# Patient Record
Sex: Male | Born: 1950
Health system: Southern US, Community
[De-identification: ages and names within clinical notes are randomized; demographics above are authoritative.]

## PROBLEM LIST (undated history)

## (undated) DIAGNOSIS — G2581 Restless legs syndrome: Secondary | ICD-10-CM

## (undated) DIAGNOSIS — G1221 Amyotrophic lateral sclerosis: Secondary | ICD-10-CM

## (undated) DIAGNOSIS — R413 Other amnesia: Secondary | ICD-10-CM

## (undated) DIAGNOSIS — G3184 Mild cognitive impairment, so stated: Secondary | ICD-10-CM

## (undated) DIAGNOSIS — N429 Disorder of prostate, unspecified: Secondary | ICD-10-CM

## (undated) HISTORY — DX: Disorder of prostate, unspecified: N42.9

## (undated) HISTORY — DX: Other amnesia: R41.3

## (undated) HISTORY — DX: Restless legs syndrome: G25.81

## (undated) HISTORY — PX: OTHER SURGICAL HISTORY: SHX169

## (undated) HISTORY — PX: NO PAST SURGERIES: SHX2092

## (undated) HISTORY — DX: Mild cognitive impairment of uncertain or unknown etiology: G31.84

---

## 1999-04-12 ENCOUNTER — Encounter: Payer: Self-pay | Admitting: Family Medicine

## 1999-04-12 ENCOUNTER — Encounter: Admission: RE | Admit: 1999-04-12 | Discharge: 1999-04-12 | Payer: Self-pay | Admitting: Family Medicine

## 2004-08-19 ENCOUNTER — Ambulatory Visit (HOSPITAL_COMMUNITY): Admission: RE | Admit: 2004-08-19 | Discharge: 2004-08-19 | Payer: Self-pay | Admitting: *Deleted

## 2009-12-24 ENCOUNTER — Encounter: Admission: RE | Admit: 2009-12-24 | Discharge: 2009-12-24 | Payer: Self-pay | Admitting: Family Medicine

## 2013-08-14 ENCOUNTER — Encounter: Payer: Self-pay | Admitting: Cardiology

## 2014-11-13 ENCOUNTER — Ambulatory Visit (INDEPENDENT_AMBULATORY_CARE_PROVIDER_SITE_OTHER): Payer: Medicare Other | Admitting: Neurology

## 2014-11-13 ENCOUNTER — Encounter: Payer: Self-pay | Admitting: Neurology

## 2014-11-13 VITALS — BP 124/71 | HR 42 | Ht 68.0 in | Wt 151.4 lb

## 2014-11-13 DIAGNOSIS — R413 Other amnesia: Secondary | ICD-10-CM

## 2014-11-13 DIAGNOSIS — R7302 Impaired glucose tolerance (oral): Secondary | ICD-10-CM | POA: Diagnosis not present

## 2014-11-13 DIAGNOSIS — R251 Tremor, unspecified: Secondary | ICD-10-CM | POA: Diagnosis not present

## 2014-11-13 NOTE — Patient Instructions (Addendum)
Overall you are doing fairly well but I do want to suggest a few things today:   Remember to drink plenty of fluid, eat healthy meals and do not skip any meals. Try to eat protein with a every meal and eat a healthy snack such as fruit or nuts in between meals. Try to keep a regular sleep-wake schedule and try to exercise daily, particularly in the form of walking, 20-30 minutes a day, if you can.   As far as your medications are concerned, I would like to suggest: Consider [name]  As far as diagnostic testing: MRi of the brain, labs, neurocognitive testing  I would like to see you back in 6 months, sooner if we need to. Please call us with any interim questions, concerns, problems, updates or refill requests.   Our phone number is [phone]. We also have an after hours call service for urgent matters and there is a physician on-call for urgent questions. For any emergencies you know to call 911 or go to the nearest emergency room

## 2014-11-13 NOTE — Progress Notes (Signed)
[name]    Provider:  Dr Lucia Gaskins Referring Provider: Catha Gosselin, MD Primary Care Physician:  [name], MD  CC:  Memory issues  HPI:  Guy Payne is a 64 y.o. male here as a referral from Dr. Clarene Duke for memory issues.PMHx of glucose intolerance, migraines.  He is here with wife who provides information, It is becoming more difficult to remember people's names, directions, conversations. Wife provides information. He says things that are not necessarily true for example as he will not say the right city they went to, word retrieval difficulties. Symptoms started within the last year or two and getting worse. No changes in personality, no behavioral changes, some jokes are more inappropriate than others but this is not necessary anything new. No delusions or hallucinations. He says sometimes when people say things he zones out if it isn't interesting him and maybe that is why he appears to forget things and repeat things they just spoke about. No history of demenita in the family. Denies any episodes of altered mental status. He makes wrong turns a lot. He needs GPS for everything even if he went to it before. Long-term memory is fine. Wife has always managed the finances. 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 No accidents in the home. He enjoys a few glasses of wine 3-4 times a week. Wife states he has a chin tremor but patient states this is voluntary. No other focal neurologic complaints.   Reviewed notes, labs and imaging from outside physicians, which showed: Reviewed primary care notes. Vitamin B12 and thyroid was checked per primary care notes. Primary care recommended continuing regular exercise which can help memory as well as keeping in mind active with reading, crossword puzzles etc.    Review of Systems: Patient complains of symptoms per HPI as well as the following symptoms; No CP, no SOB, No fever or  systemic symptoms. Pertinent negatives per HPI. All others negative.   Social History   Social History  . Marital Status: Single    Spouse Name: N/A  . Number of Children: 1  . Years of Education: 16+   Occupational History  . Retired     Social History Main Topics  . Smoking status: Never Smoker   . Smokeless tobacco: Not on file  . Alcohol Use: 0.0 oz/week    0 Standard drinks or equivalent per week     Comment: 3-4 days per week. No more than 2 glasses  . Drug Use: No  . Sexual Activity: Not on file   Other Topics Concern  . Not on file   Social History Narrative   Lives at home with wife   Caffeine use: Drinks 2 cups coffee in morning   Drinks tea- 2 glasses occasionally     History reviewed. No pertinent family history.  History reviewed. No pertinent past medical history.  Past Surgical History  Procedure Laterality Date  . No past surgeries      Current Outpatient Prescriptions  Medication Sig Dispense Refill  . tadalafil (CIALIS) 10 MG tablet Take 10 mg by mouth daily as needed for erectile dysfunction.     No current facility-administered medications for this visit.    Allergies as of 11/13/2014  . (No Known Allergies)    Vitals: BP 124/71 mmHg  Pulse 42  Ht  (1.727 m)  Wt 151 lb 6.4 oz (68.675 kg)  BMI 23.03 kg/m2 Last Weight:  Wt Readings from Last 1 Encounters:  11/13/14 151 lb 6.4 oz (68.675 kg)   Last Height:   Ht Readings from Last 1 Encounters:  11/13/14  (1.727 m)   Physical exam: Exam: Gen: NAD, conversant, well nourised, well groomed                     CV: RRR, no MRG. No Carotid Bruits. No peripheral edema, warm, nontender Eyes: Conjunctivae clear without exudates or hemorrhage  Neuro: Detailed Neurologic Exam  Speech:    Speech is normal; fluent and spontaneous with normal comprehension.  Cognition:  Montreal Cognitive Assessment  11/13/2014  Visuospatial/ Executive (0/5) 5  Naming (0/3) 1  Attention:  Read list of digits (0/2) 1  Attention: Read list of letters (0/1) 1  Attention: Serial 7 subtraction starting at 100 (0/3) 3  Language: Repeat phrase (0/2) 2  Language : Fluency (0/1) 1  Abstraction (0/2) 1  Delayed Recall (0/5) 1  Orientation (0/6) 5  Total 21  Adjusted Score (based on education) 21       The patient is oriented to person, place, and time;     recent and remote memory intact;     language fluent;     normal attention, concentration,     fund of knowledge Cranial Nerves:    The pupils are equal, round, and reactive to light. The fundi are flat. Visual fields are full to finger confrontation. Extraocular movements are intact. Trigeminal sensation is intact and the muscles of mastication are normal. The face is symmetric. The palate elevates in the midline. Hearing intact. Voice is normal. Shoulder shrug is normal. The tongue has normal motion without fasciculations.   Coordination:    Normal finger to nose and heel to shin. Normal rapid alternating movements.   Gait:    Heel-toe and tandem gait are normal.   Motor Observation:    No asymmetry, no atrophy, and no involuntary movements noted. Tone:    Normal muscle tone.    Posture:    Posture is normal. normal erect    Strength:    Strength is V/V in the upper and lower limbs.      Sensation: intact to LT     Reflex Exam:  DTR's: Absent AJs    Deep tendon reflexes in the upper and lower extremities are brisk bilaterally.   Toes:    The toes are downgoing bilaterally.   Clonus:    Clonus is absent.       Assessment/Plan:  This is a very lovely 64 year old male who is here for several years of progressive memory loss with his wife. MoCA 21/30 does shows some cognitive impairment more advanced than for age. He is very pleasant and does not appear to have any behavioral disturbances. Recommend MRI of the brain. Also discussed starting [name]. Did have a discussion about neurocognitive testing and they  would like to go forward with this. Patient is to follow-up with me when evaluation of the above is complete. Limit alcohol to maximum 1-2 drinks a day per guidelines but I suggest less daily. At next appointment will recheck [facility] cognitive assessment test, discuss starting [name], and review formal neurocognitive testing.   Naomie Dean, MD  [facility] [address] [place], [place] [address]  Phone [phone] Fax [phone]

## 2014-11-16 DIAGNOSIS — R413 Other amnesia: Secondary | ICD-10-CM

## 2014-11-16 DIAGNOSIS — G3184 Mild cognitive impairment, so stated: Secondary | ICD-10-CM | POA: Insufficient documentation

## 2014-11-16 DIAGNOSIS — R7302 Impaired glucose tolerance (oral): Secondary | ICD-10-CM | POA: Insufficient documentation

## 2014-12-11 ENCOUNTER — Ambulatory Visit
Admission: RE | Admit: 2014-12-11 | Discharge: 2014-12-11 | Disposition: A | Discharge status: Home / Self Care | Payer: Medicare Other | Source: Ambulatory Visit | Attending: Neurology | Admitting: Neurology

## 2014-12-11 DIAGNOSIS — R413 Other amnesia: Secondary | ICD-10-CM | POA: Diagnosis not present

## 2014-12-11 DIAGNOSIS — R251 Tremor, unspecified: Secondary | ICD-10-CM | POA: Diagnosis not present

## 2014-12-15 ENCOUNTER — Telehealth: Payer: Self-pay | Admitting: Neurology

## 2014-12-15 NOTE — Telephone Encounter (Signed)
Called again, no answer. Left message saying I was calling about MRi results. They can call back.

## 2014-12-15 NOTE — Telephone Encounter (Signed)
Called to speak to patient and wife about MRI results. Left messages on home and call number. 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    MRi of the brain: This MRI of the brain without contrast shows the following: 1. 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 Mild to moderate atrophy elsewhere in the brain. 2. Minimal extent of her foci in the deep white matter consistent with minimal age-related microvascular ischemic changes. 3. There are no acute findings.

## 2014-12-16 NOTE — Telephone Encounter (Signed)
Pt's wife called and is asking for a return call on her cell phone [phone], Lurena JoinerRebecca . Thank you

## 2014-12-16 NOTE — Telephone Encounter (Signed)
Patient and wife returned phone call. Please call back to Hm [phone] or Wife's cell [phone].

## 2014-12-17 NOTE — Telephone Encounter (Signed)
I spoke to wife. Apologized for the phone tag, I have called multiple times and left multiple messages and also spoke to husband earlier today. 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

## 2014-12-17 NOTE — Telephone Encounter (Signed)
Spoke to patient, discussed MRi of the brain and the severe mesial temporal atrophy. His clinical exam though did not show severe memory loss that would correlate with severe atrophy of this extent so there is some inconsistently between the MRI and the clincial exam. Still, this is associated with an alzheimer's type dementia.    Kara Meadmma - can you put a copy of his MRi of the brain in the mail to patient today? He gave me verbal approval so I am happy to sign a medical release form to get that to him. Thanks!

## 2014-12-17 NOTE — Telephone Encounter (Signed)
Pt's wife called a little upset, sts she wants to talk with Dr Lucia GaskinsAhern, she has been the one calling for results and wants to know what she can do to help. Please call and advise her at cell [phone].

## 2014-12-17 NOTE — Telephone Encounter (Signed)
Pt's wife called ,Lurena JoinerRebecca, and would like a call back pertaining pt's MRI results. Thank you. Please call [phone]

## 2014-12-18 ENCOUNTER — Other Ambulatory Visit: Payer: Self-pay | Admitting: Neurology

## 2014-12-18 MED ORDER — DONEPEZIL HCL 10 MG PO TABS: 10.0000 mg | ORAL_TABLET | Freq: Every day | ORAL | Status: DC

## 2014-12-18 MED ORDER — DONEPEZIL HCL 5 MG PO TABS: 5.0000 mg | ORAL_TABLET | Freq: Every day | ORAL | Status: DC

## 2014-12-18 NOTE — Telephone Encounter (Signed)
Placed Rx [name] 10mg  up front for pt/wife to pick up. Also placed record release form with MRI results for them to pick up at the front.

## 2014-12-18 NOTE — Telephone Encounter (Signed)
Pt's wife called and states that while speaking with Dr. Lucia GaskinsAhern about MRI results she was told that a Rx for [name] was going to be called into their pharmacy. She is at the pharmacy now and they do not have it. She is requesting a call back on cell phone [phone]. Thank you

## 2014-12-18 NOTE — Telephone Encounter (Signed)
Called wife back and relayed Dr Lucia GaskinsAhern message below. She is going to stop by tomorrow to pick up Rx aricept 10mg  and sign release form to get MRI results. Explained how to sign up for mychart, but she declined. Offered to fax record release form, but she said she does not have a fax number. Advised we are open 8-12pm tomorrow. She verbalized understanding.

## 2015-01-06 ENCOUNTER — Other Ambulatory Visit: Payer: Self-pay

## 2015-01-06 MED ORDER — DONEPEZIL HCL 10 MG PO TABS: 10.0000 mg | ORAL_TABLET | Freq: Every day | ORAL | Status: DC

## 2015-05-14 ENCOUNTER — Ambulatory Visit (INDEPENDENT_AMBULATORY_CARE_PROVIDER_SITE_OTHER): Payer: Medicare Other | Admitting: Nurse Practitioner

## 2015-05-14 ENCOUNTER — Encounter: Payer: Self-pay | Admitting: Nurse Practitioner

## 2015-05-14 VITALS — BP 112/72 | HR 46 | Ht 68.0 in | Wt 148.0 lb

## 2015-05-14 DIAGNOSIS — R413 Other amnesia: Secondary | ICD-10-CM

## 2015-05-14 DIAGNOSIS — G4761 Periodic limb movement disorder: Secondary | ICD-10-CM | POA: Insufficient documentation

## 2015-05-14 NOTE — Progress Notes (Addendum)
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 male here as a referral from [name] for memory issues.PMHx of glucose intolerance, migraines. He is here with wife who provides information, It is becoming more difficult to remember people's names, directions, conversations. Wife provides information. He says things that are not necessarily true for example as he will not say the right city they went to, word retrieval difficulties. Symptoms started within the last year or two and getting worse. No changes in personality, no behavioral changes, some jokes are more inappropriate than others but this is not necessary anything new. No delusions or hallucinations. He says sometimes when people say things he zones out if it isn't interesting him and maybe that is why he appears to forget things and repeat things they just spoke about. No history of demenita in the family. Denies any episodes of altered mental status. He makes wrong turns a lot. He needs GPS for everything even if he went to it before. Long-term memory is fine. Wife has always managed the finances. 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 No accidents in the home. He enjoys a few glasses of wine 3-4 times a week. Wife states he has a chin tremor but patient states this is voluntary. No other focal neurologic complaints. 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 Mr.Guy Payne, 65 year old male returns for follow-up with history of memory loss with symptoms starting about 2 years ago and worsening. He has more difficulty remembering people's names directions in conversations. He needs GPS for driving. He is retired. 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  Wife also reports today that he has movements of his extremities when he is asleep at night. He is kicking her and she can't stay asleep.  She is not very specific as to how often this occurs she thinks maybe once or twice a week. He also has abnormal movements which sound like myoclonus.  He returns for reevaluation    REVIEW OF SYSTEMS: Full 14 system review of systems performed and notable only for those listed, all others are neg:  Constitutional: neg  Cardiovascular: neg Ear/Nose/Throat: neg  Skin: neg Eyes: neg Respiratory: neg Gastroitestinal: neg  Hematology/Lymphatic: neg  Endocrine: neg Musculoskeletal:neg Allergy/Immunology: neg Neurological: neg Psychiatric: neg Sleep : neg   ALLERGIES: No Known Allergies  HOME MEDICATIONS: Outpatient Prescriptions Prior to Visit  Medication Sig Dispense Refill  . donepezil ([name]) 5 MG tablet Take 1 tablet (5 mg total) by mouth at bedtime. 30 tablet 0  . tadalafil (CIALIS) 10 MG tablet Take 10 mg by mouth daily as needed for erectile dysfunction.    Marland Kitchen. donepezil ([name]) 10 MG tablet Take 1 tablet (10 mg total) by mouth at bedtime. 90 tablet 3   No facility-administered medications prior to visit.    PAST MEDICAL HISTORY: History reviewed. No pertinent past medical history.  PAST SURGICAL HISTORY: Past Surgical History  Procedure Laterality Date  . No past surgeries      FAMILY HISTORY: History reviewed. No pertinent family history.  SOCIAL HISTORY: Social History   Social History  . Marital Status: Single    Spouse Name: N/A  . Number of Children: 1  . Years of Education: 16+   Occupational History  . Retired     Social History Main Topics  . Smoking status: Never Smoker   . Smokeless tobacco: Never Used  . Alcohol Use: 0.0 oz/week    0  Standard drinks or equivalent per week     Comment: 3-4 days per week. No more than 2 glasses  . Drug Use: No  . Sexual Activity: Not on file   Other Topics Concern  . Not on file   Social History Narrative   Lives at home with wife   Caffeine use: Drinks 2 cups coffee in morning   Drinks tea- 2 glasses occasionally      PHYSICAL EXAM  Filed Vitals:   05/14/15 1322  BP: 112/72  Pulse: 46  Height:  (1.727 m)  Weight: 148 lb (67.132 kg)   Body mass index is 22.51 kg/(m^2).  Generalized: Well developed, in no acute distress  Head: normocephalic and atraumatic,. Oropharynx benign  Neck: Supple, no carotid bruits  Cardiac: Regular rate rhythm, no murmur  Musculoskeletal: No deformity   Neurological examination   Mentation: Alert MMSE 23/30 missing items in orientation 3 of 3 recall .   Follows all commands speech and language fluent.   Cranial nerve II-XII: Pupils were equal round reactive to light extraocular movements were full, visual field were full on confrontational test. Facial sensation and strength were normal. hearing was intact to finger rubbing bilaterally. Uvula tongue midline. head turning and shoulder shrug were normal and symmetric.Tongue protrusion into cheek strength was normal. Motor: normal bulk and tone, full strength in the BUE, BLE, fine finger movements normal, no pronator drift. No focal weakness Sensory: normal and symmetric to light touch, pinprick, and  Vibration, proprioception  Coordination: finger-nose-finger, heel-to-shin bilaterally, no dysmetria Reflexes: Brachioradialis 2/2, biceps 2/2, triceps 2/2, patellar 2/2, Achilles 2/2, plantar responses were flexor bilaterally. Gait and Station: Rising up from seated position without assistance, normal stance,  moderate stride, good arm swing, smooth turning, able to perform tiptoe, and heel walking without difficulty. Tandem gait is steady  DIAGNOSTIC DATA (LABS, IMAGING, TESTING) - ASSESSMENT  AND PLAN  65 y.o. year old male  with memory loss which has been going on for several years. 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 Abnormal movements in sleep? 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  I have personally evaluated lab data, reviewed imaging studies and agree with radiology interpretations.    Naomie Dean, MD [name]

## 2015-05-14 NOTE — Patient Instructions (Signed)
Continue [name] Will order sleep referral F/U in 6 months

## 2015-06-18 ENCOUNTER — Encounter: Payer: Self-pay | Admitting: Neurology

## 2015-06-18 ENCOUNTER — Ambulatory Visit (INDEPENDENT_AMBULATORY_CARE_PROVIDER_SITE_OTHER): Payer: Medicare Other | Admitting: Neurology

## 2015-06-18 VITALS — BP 124/84 | HR 58 | Resp 20 | Ht 68.0 in | Wt 145.0 lb

## 2015-06-18 DIAGNOSIS — G4769 Other sleep related movement disorders: Secondary | ICD-10-CM

## 2015-06-18 DIAGNOSIS — G300 Alzheimer's disease with early onset: Secondary | ICD-10-CM | POA: Diagnosis not present

## 2015-06-18 DIAGNOSIS — G478 Other sleep disorders: Secondary | ICD-10-CM

## 2015-06-18 DIAGNOSIS — F0280 Dementia in other diseases classified elsewhere without behavioral disturbance: Secondary | ICD-10-CM

## 2015-06-18 NOTE — Progress Notes (Signed)
SLEEP MEDICINE CLINIC   Provider:  Melvyn Novas, M D  Referring Provider: Catha Gosselin, MD Primary Care Physician:  [name], MD  Chief Complaint  Patient presents with  . New Patient (Initial Visit)    arms and legs move around in sleep, rm 10, with wife    HPI:  Guy Payne is a 65 y.o. male , seen here as an internal [facility]  referral from Dr Lucia Gaskins and Darrol Angel, NP  Chief complaint according to patient : "Dreams acting out " , more impulsive, easily agitated. 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     Mr. and Mrs. Guy Payne is seeing me today for concern of the patient's wife having noticed him to have sudden movements during his sleep. She describes these as almost jerking sudden, myoclonic characterized movements that the patient himself is not aware of as he continues to sleep. His wife however wakes up next to him from these movements. He reports that she snores, but he does not feel that his sleep is of lower quality, not restorative or not refreshing. I was able to review Carolyn's and Antonia's notes, and reviewed that the patient's long-term memory has been assessed multiple times is quite intact, but short-term memory loss has been evident. His wife had noticed that he has a chin tremor , that he is often incessantly moving. 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 The MRI was dated 12-12-14.    Sleep habits are as follows: The patient goes to bed between 9 and 9:30 PM and usually rises at about 6:30 AM. He reports that multiple times at night he will wake up but he does not have trouble going back to sleep. He has frequent bathroom breaks more than 2- 3 at night. He never used to take naps until the last year. His wife confirms that the myoclonic jerking happens between 2 and 4 AM most nights. It can be up to 6 or 7 of these movements in a cluster. This has also raised her concerns about possible seizures. He  sleeps overall 8 hours each night.  He snores mildly and not every night. The bedroom is cool, quiet and dark. He sleeps on one pillow, prefers to fall asleep on his side. He " spoons with his wife " . Some nights a grand child will sleep in their bed.    Sleep medical history and family sleep history:  No history of sleep disorders.    Social history:  Married, father and grandfather.    Review of Systems: Out of a complete 14 system review, the patient complains of only the following symptoms, and all other reviewed systems are negative. He moves with both hands and both legs, RLS ? Patient feels her restless leg rating scale at minor impairment in the quality of life questionnaire for restless leg patients at sometimes impairment. Fatigue severity scale was endorsed at 27 points and Epworth Sleepiness Scale at 10 points, geriatric depression scale confirmed at 2 points out of 15 .  Social History   Social History  . Marital Status: Single    Spouse Name: N/A  . Number of Children: 1  . Years of Education: 16+   Occupational History  . Retired     Social History Main Topics  . Smoking status: Never Smoker   . Smokeless tobacco: Never Used  . Alcohol Use: 0.0 oz/week    0 Standard drinks or  equivalent per week     Comment: 3-4 days per week. No more than 2 glasses  . Drug Use: No  . Sexual Activity: Not on file   Other Topics Concern  . Not on file   Social History Narrative   Lives at home with wife   Caffeine use: Drinks 2 cups coffee in morning   Drinks tea- 2 glasses occasionally     No family history on file.  No past medical history on file.  Past Surgical History  Procedure Laterality Date  . No past surgeries      Current Outpatient Prescriptions  Medication Sig Dispense Refill  . donepezil ([name]) 5 MG tablet Take 1 tablet (5 mg total) by mouth at bedtime. 30 tablet 0  . Sulfacetamide Sodium-Sulfur 10-2 % CREA APPLY TO FACE AT BEDTIME  4  .  tadalafil (CIALIS) 10 MG tablet Take 10 mg by mouth daily as needed for erectile dysfunction.     No current facility-administered medications for this visit.    Allergies as of 06/18/2015  . (No Known Allergies)    Vitals: BP 124/84 mmHg  Pulse 58  Resp 20  Ht  (1.727 m)  Wt 145 lb (65.772 kg)  BMI 22.05 kg/m2 Last Weight:  Wt Readings from Last 1 Encounters:  06/18/15 145 lb (65.772 kg)   OZH:YQMV mass index is 22.05 kg/(m^2).     Last Height:   Ht Readings from Last 1 Encounters:  06/18/15  (1.727 m)    Physical exam:  General: The patient is awake, alert and appears not in acute distress. The patient is well groomed. Head: Normocephalic, atraumatic. Neck is supple. Mallampati 1  neck circumference:15. Nasal airflow unrestricted ,  Cardiovascular:  Regular rate and rhythm , without  murmurs or carotid bruit, and without distended neck veins. Respiratory: Lungs are clear to auscultation. Skin:  Without evidence of edema, or rash Trunk:slender  Neurologic exam : The patient is awake and alert, oriented to place and time.    Attention span & concentration ability appears normal.  Speech is fluent,  without  dysarthria, dysphonia or aphasia.  Mood and affect are appropriate.  Cranial nerves: Pupils are equal and briskly reactive to light. Funduscopic exam without  evidence of pallor or edema. Extraocular movements  in vertical and horizontal planes intact and without nystagmus. Visual fields by finger perimetry are intact. Hearing to finger rub intact. Facial sensation intact to fine touch.Facial motor strength is symmetric and tongue and uvula move midline. Shoulder shrug was symmetrical.   Motor exam:  Normal tone, muscle bulk and symmetric strength in all extremities. He is constantly moving,   Sensory:  Fine touch, pinprick and vibration were tested in all extremities. Proprioception tested in the upper extremities was normal.  Coordination:   Finger-to-nose maneuver  normal without evidence of ataxia, dysmetria or tremor.  Gait and station: Patient walks without assistive device and is able unassisted to climb up to the exam table. Strength within normal limits.   Deep tendon reflexes: in the  upper and lower extremities are symmetric and intact. Babinski maneuver response is downgoing.  The patient was advised of the nature of the diagnosed sleep disorder , the treatment options and risks for general a health and wellness arising from not treating the condition.  I spent more than 35 minutes of face to face time with the patient. Greater than 50% of time was spent in counseling and coordination of care. We have discussed the  diagnosis and differential and I answered the patient's questions.     Assessment:  After physical and neurologic examination, review of laboratory studies,  Personal review of imaging studies, reports of other /same  Imaging studies ,  Results of polysomnography/ neurophysiology testing and pre-existing records as far as provided in visit., my assessment is   1) based on the above reported symptoms of which Mr. Guy LikesDe Groot is not aware, I will order a polysomnography is parasomnia montage. My goal is to expand the EEG montage to look for parasomnia versus seizure activity. 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 If sleep myoclonus is confirmed my treatment would be Depakote at night 250 mg by mouth. We will meet after the sleep study to see if seizures were ruled in or ruled out.   Plan:  Treatment plan and additional workup :  The patient will need a PSG is parasomnia montage, I expect a very good video and audio recording. The patient will not need a sitter. Consider one-on-one. Nocturia seems related to drinking 2-3 glasses of Tea before bedtime.   RV after PSG if  positive for organic sleep disorder.  Porfirio Mylararmen Celia Gibbons MD  06/18/2015   CC: Catha GosselinKevin Little, Md [address] [place], [place] [id number]

## 2015-07-14 ENCOUNTER — Ambulatory Visit (INDEPENDENT_AMBULATORY_CARE_PROVIDER_SITE_OTHER): Payer: Medicare Other | Admitting: Neurology

## 2015-07-14 DIAGNOSIS — G478 Other sleep disorders: Secondary | ICD-10-CM | POA: Diagnosis not present

## 2015-07-14 DIAGNOSIS — F0280 Dementia in other diseases classified elsewhere without behavioral disturbance: Secondary | ICD-10-CM

## 2015-07-14 DIAGNOSIS — G300 Alzheimer's disease with early onset: Secondary | ICD-10-CM

## 2015-07-14 DIAGNOSIS — G4769 Other sleep related movement disorders: Secondary | ICD-10-CM

## 2015-07-21 ENCOUNTER — Telehealth: Payer: Self-pay

## 2015-07-21 NOTE — Telephone Encounter (Signed)
Patient needs appt to discuss sleep study results.

## 2015-07-22 NOTE — Telephone Encounter (Signed)
I spoke to patient's wife. She is aware of results and would like to just keep f/u with Eber Jonesarolyn in September. Her main concern was seizures during the night but no seizure activity was noted during study. I will fax copy of report to PCP.

## 2015-11-09 ENCOUNTER — Other Ambulatory Visit: Payer: Self-pay | Admitting: Neurology

## 2015-11-11 ENCOUNTER — Telehealth: Payer: Self-pay | Admitting: Neurology

## 2015-11-11 NOTE — Telephone Encounter (Addendum)
Spouse called to request copy of [facility], Dr. Boone MasterHudaisa Fatima [phone], Dr. Margaretmary LombardFatima wants to study this MRI with the one that was done there so they can compare the 2 studies prior to Daybreak study. States their office has been trying to contact our office for this and hasn't heard back. Wife states she could pick this up at husband's visit next Thursday with NP, Darrol Angelarolyn Martin.

## 2015-11-19 ENCOUNTER — Ambulatory Visit (INDEPENDENT_AMBULATORY_CARE_PROVIDER_SITE_OTHER): Payer: Medicare Other | Admitting: Nurse Practitioner

## 2015-11-19 ENCOUNTER — Encounter: Payer: Self-pay | Admitting: Nurse Practitioner

## 2015-11-19 VITALS — BP 109/64 | HR 48 | Ht 68.0 in | Wt 151.4 lb

## 2015-11-19 DIAGNOSIS — R413 Other amnesia: Secondary | ICD-10-CM

## 2015-11-19 DIAGNOSIS — G4761 Periodic limb movement disorder: Secondary | ICD-10-CM | POA: Diagnosis not present

## 2015-11-19 NOTE — Patient Instructions (Signed)
Memory score is stable Continue [name] at current dose Will check ferriten levels F/U in 6 months

## 2015-11-19 NOTE — Progress Notes (Signed)
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 Clarene Payne for memory issues.PMHx of glucose intolerance, migraines. He is here with wife who provides information, It is becoming more difficult to remember people's names, directions, conversations. Wife provides information. He says things that are not necessarily true for example as he will not say the right city they went to, word retrieval difficulties. Symptoms started within the last year or two and getting worse. No changes in personality, no behavioral changes, some jokes are more inappropriate than others but this is not necessary anything new. No delusions or hallucinations. He says sometimes when people say things he zones out if it isn't interesting him and maybe that is why he appears to forget things and repeat things they just spoke about. No history of demenita in the family. Denies any episodes of altered mental status. He makes wrong turns a lot. He needs GPS for everything even if he went to it before. Long-term memory is fine. Wife has always managed the finances. 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 No accidents in the home. He enjoys a few glasses of wine 3-4 times a week. Wife states he has a chin tremor but patient states this is voluntary. No other focal neurologic complaints. 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 Guy Payne, 65 year old male returns for follow-up with history of memory loss with symptoms starting about 2 years ago and worsening. He has more difficulty remembering people's names directions in conversations. He needs GPS for driving. He is retired. 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  Wife also reports today that he has movements of his extremities when he is asleep at night. He is kicking her and she can't stay asleep.  She is not very specific as to how often this occurs she thinks maybe once or twice a week. He also has abnormal movements which sound like myoclonus.  He returns for reevaluation UPDATE 11/19/15 CM Guy Payne, 65 year old male returns for follow-up. He has a history of memory loss for approximately 2 years. He needs GPS for driving. He has difficulty remembering people's names directions in conversations. Because of his reported movements of his extremities he had a sleep study on 07/23/2015. Results of the studies showed no parasomnia, no evidence of sleep apnea. EEG without any seizure activity frequent limb movements were noted. He occasionally has a jaw tremor. No other complaints today. He is in a research study at [facility]. He remains on [name] 10 mg daily  REVIEW OF SYSTEMS: Full 14 system review of systems performed and notable only for those listed, all others are neg:  Constitutional: neg  Cardiovascular: neg Ear/Nose/Throat: neg  Skin: neg Eyes: neg Respiratory: neg Gastroitestinal: Urinary frequency Hematology/Lymphatic: neg  Endocrine: neg Musculoskeletal:neg Allergy/Immunology: neg Neurological: Memory loss Psychiatric: neg Sleep : Restless legs   ALLERGIES: No Known Allergies  HOME MEDICATIONS: Outpatient Medications Prior to Visit  Medication Sig Dispense Refill  . Sulfacetamide Sodium-Sulfur 10-2 % CREA APPLY TO FACE AT BEDTIME  4  .  tadalafil (CIALIS) 10 MG tablet Take 10 mg by mouth daily as needed for erectile dysfunction.    Marland Kitchen. donepezil ([name]) 5 MG tablet Take 1 tablet (5 mg total) by mouth at bedtime. 30 tablet 0   No facility-administered medications prior  to visit.     PAST MEDICAL HISTORY: History reviewed. No pertinent past medical history.  PAST SURGICAL HISTORY: Past Surgical History:  Procedure Laterality Date  . NO PAST SURGERIES      FAMILY HISTORY: History reviewed. No pertinent family history.  SOCIAL HISTORY: Social History   Social History  . Marital status: Single    Spouse name: N/A  . Number of children: 1  . Years of education: 16+   Occupational History  . Retired     Social History Main Topics  . Smoking status: Never Smoker  . Smokeless tobacco: Never Used  . Alcohol use 0.0 oz/week     Comment: 3-4 days per week. No more than 2 glasses  . Drug use: No  . Sexual activity: Not on file   Other Topics Concern  . Not on file   Social History Narrative   Lives at home with wife   Caffeine use: Drinks 2 cups coffee in morning   Drinks tea- 2 glasses occasionally      PHYSICAL EXAM  Vitals:   11/19/15 1314  BP: 109/64  Pulse: (!) 48  Weight: 151 lb 6.4 oz (68.7 kg)  Height: 5\' 8"  (1.727 m)   Body mass index is 23.02 kg/m.  Generalized: Well developed, in no acute distress  Head: normocephalic and atraumatic,. Oropharynx benign  Neck: Supple, no carotid bruits  Cardiac: Regular rate rhythm, no murmur  Musculoskeletal: No deformity   Neurological examination   Mentation: Alert , MMSE 23 out of 30 missing items in orientation calculation. AFT 8. Clock drawing 4/4.  Recent  and remote memory intact.  Follows all commands speech and language fluent.   Cranial nerve II-XII: Pupils were equal round reactive to light extraocular movements were full, visual field were full on confrontational test. Facial sensation and strength were normal. hearing was intact to finger rubbing bilaterally. Uvula tongue midline. head turning and shoulder shrug were normal and symmetric.Tongue protrusion into cheek strength was normal. Motor: normal bulk and tone, full strength in the BUE, BLE, fine finger movements  normal, no pronator drift. No focal weakness Sensory: normal and symmetric to light touch, pinprick, and  Vibration, in the upper and lower extremities  Coordination: finger-nose-finger, heel-to-shin bilaterally, no dysmetria Reflexes: Brachioradialis 2/2, biceps 2/2, triceps 2/2, patellar 2/2, Achilles 2/2, plantar responses were flexor bilaterally. Gait and Station: Rising up from seated position without assistance, normal stance,  moderate stride, good arm swing, smooth turning, able to perform tiptoe, and heel walking without difficulty. Tandem gait is steady  DIAGNOSTIC DATA (LABS, IMAGING, TESTING) - ASSESSMENT AND PLAN  65 y.o. year old male  has no past medical history on file. here with history of memory loss which has been going on for several years. 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 Recent sleep study with no parasomnia and no evidence of sleep apnea. EEG without seizure activity. Frequent limb movements were noted  Memory score is stable Continue [name] at current dose Reviewed his sleep study with him and wife Will check ferriten levels Discussed treatment of low ferritin level if labs come back abnormal F/U in 6  months Dennie Bible, [facility], [facility], APRN  [facility] [address], [place], [place] [address] [phone]

## 2015-11-20 LAB — IRON AND TIBC
Iron Saturation: 24 % (ref 15–55)
Iron: 65 ug/dL (ref 38–169)
TIBC: 271 ug/dL (ref 250–450)
UIBC: 206 ug/dL (ref 111–343)

## 2015-11-20 LAB — FERRITIN: FERRITIN: 93 ng/mL (ref 30–400)

## 2015-11-23 ENCOUNTER — Telehealth: Payer: Self-pay | Admitting: *Deleted

## 2015-11-23 NOTE — Telephone Encounter (Signed)
Per Enid Skeens Martin, NP LVM for patient's wife and informed her that her husband's iron and ferritin labs are normal. Left name, number for any questions.

## 2015-12-10 ENCOUNTER — Telehealth: Payer: Self-pay | Admitting: Neurology

## 2015-12-10 MED ORDER — PRAMIPEXOLE DIHYDROCHLORIDE 0.125 MG PO TABS: 0.1250 mg | ORAL_TABLET | Freq: Every day | ORAL | 1 refills | Status: DC

## 2015-12-10 NOTE — Addendum Note (Signed)
Addended by: Stephanie AcreWILLIS, Andraya Frigon on: 12/10/2015 06:15 PM   Modules accepted: Orders

## 2015-12-10 NOTE — Telephone Encounter (Signed)
Pt's wife called said the pt is at PCP/Dr Little office and he is wondering if he could take something else instead of donepezil ([name]) 10 MG tablet for memory. The sleep study revealed restless leg and Dr Clarene DukeLittle advised restless leg could be a side effect of aricept.  She also said  Dr Voncille LoLittle-PCP needs all OV, MRI , Sleep study Pt's wife is aware Dr Lucia GaskinsAhern is not in the office until next week and this can wait until her return

## 2015-12-10 NOTE — Telephone Encounter (Signed)
I called the patient, talk with the wife. The patient indicates that he is having periodic limb movements at nighttime, this is keeping her awake, but he is sleeping quite well. She believes that the [name] is the cause of the periodic limb movements, and that this problem started when he started this medication.  I would keep the patient on [name], I will add a very low dose of Mirapex at night to see if this helps. A prescription was called in.

## 2015-12-17 ENCOUNTER — Telehealth: Payer: Self-pay | Admitting: Neurology

## 2015-12-17 ENCOUNTER — Telehealth: Payer: Self-pay | Admitting: *Deleted

## 2015-12-17 NOTE — Telephone Encounter (Signed)
Pt's wife called in upset that the MRI disk [facility] was [facility]  Attn: [facility] fat[facility]  [address] [place] [id number]  Phone: [phone] Phone: [phone] The wife states it has been 4 weeks since she was in here to sign a release form for the disk and [facility] not [facility]. They will not schedule the pt with out the disk. This is delaying pt care. Please call and advise the wife when it has been mailed out. [phone]- Wife is asking for this to be done as soon as possible.

## 2015-12-17 NOTE — Telephone Encounter (Signed)
I spoke to patient wife, to let her know that I will [facility].

## 2015-12-17 NOTE — Telephone Encounter (Signed)
See separate note from Debra S. She handled this message.

## 2015-12-24 NOTE — Progress Notes (Signed)
Personally have participated in and made any corrections needed to history, physical, neuro exam,assessment and plan as stated above.    Anjolie Majer, MD [name] 

## 2015-12-28 ENCOUNTER — Other Ambulatory Visit: Payer: Self-pay | Admitting: Neurology

## 2016-03-28 ENCOUNTER — Other Ambulatory Visit: Payer: Self-pay

## 2016-03-28 MED ORDER — PRAMIPEXOLE DIHYDROCHLORIDE 0.125 MG PO TABS: 0.1250 mg | ORAL_TABLET | Freq: Every day | ORAL | 5 refills | Status: DC

## 2016-05-17 ENCOUNTER — Encounter (INDEPENDENT_AMBULATORY_CARE_PROVIDER_SITE_OTHER): Payer: Self-pay

## 2016-05-17 ENCOUNTER — Ambulatory Visit (INDEPENDENT_AMBULATORY_CARE_PROVIDER_SITE_OTHER): Payer: Medicare Other | Admitting: Neurology

## 2016-05-17 ENCOUNTER — Encounter: Payer: Self-pay | Admitting: Neurology

## 2016-05-17 VITALS — BP 121/63 | HR 47 | Ht 68.0 in | Wt 157.4 lb

## 2016-05-17 DIAGNOSIS — G3184 Mild cognitive impairment, so stated: Secondary | ICD-10-CM

## 2016-05-17 NOTE — Progress Notes (Signed)
[name]   Provider:  Dr Lucia Gaskins Referring Provider: Catha Gosselin, MD Primary Care Physician:  [name], MD  CC:  Memory issues  Interval History 05/17/2016: Patient is here for follow-up of dementia. MRI of the brain showed severe atrophy of the mesial and anterior temporal lobes bilaterally consistent with an Alzheimer's type pathology. 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 Anne Hahn. 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 Discussed Trailblazers with patient and wife extensively.  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 male here as a referral from Dr. Clarene Duke for memory issues.PMHx of glucose intolerance, migraines. He is here with wife who provides information, It is becoming more difficult to remember people's names, directions, conversations. Wife provides information. He says things that are not necessarily true for example as he will not say the right city they went to, word retrieval difficulties. Symptoms started within the last year or two and getting worse. No changes in personality, no behavioral changes, some jokes are more inappropriate than others but this is not necessary anything new. No delusions or hallucinations. He says sometimes when people say things he zones out if it isn't interesting him and maybe that is why he appears to forget things and repeat things they just spoke about. No history of demenita in the family. Denies any episodes of altered mental status. He makes wrong turns a lot. He needs GPS for everything even if he went to it before. Long-term memory is fine. Wife has always managed the finances. 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 No accidents in the home. He enjoys a few glasses of wine 3-4 times a week. 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 No other focal neurologic complaints. 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 Guy Payne, 66 year old male returns for follow-up with history of memory loss with symptoms starting about 2 years ago and worsening. He has more difficulty remembering people's names directions in conversations. He needs GPS for driving. He is retired. 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  Wife also reports today that he has movements of his extremities when he is asleep at night. He is kicking her and she can't stay asleep.  She is not very specific as to how often this occurs she thinks maybe once or twice a week. He also has abnormal movements which sound like myoclonus.  He returns for reevaluation    HPI:  Guy Payne is a 66 y.o. male here as a referral from Dr. Clarene Duke for memory issues.PMHx of glucose intolerance, migraines.  He is here with wife who provides information, It is becoming more difficult to remember people's names, directions, conversations. Wife provides information. He says things that are not necessarily true for example as he will not say the right city they went to, word retrieval difficulties. Symptoms started within the last year or two and getting worse. No changes in personality, no behavioral changes, some jokes are more inappropriate than others but this is not necessary anything new.  No delusions or hallucinations. He says sometimes when people say things he zones out if it isn't interesting him and maybe that is why he appears to forget things and repeat things they just spoke about. No history of demenita in the family. Denies any episodes of altered mental status. He makes wrong turns a lot. He needs GPS for  everything even if he went to it before. Long-term memory is fine. Wife has always managed the finances. 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 No accidents in the home. He enjoys a few glasses of wine 3-4 times a week. Wife states he has a chin tremor but patient states this is voluntary. No other focal neurologic complaints.   Reviewed notes, labs and imaging from outside physicians, which showed: Reviewed primary care notes. Vitamin B12 and thyroid was checked per primary care notes. Primary care recommended continuing regular exercise which can help memory as well as keeping in mind active with reading, crossword puzzles etc.    Review of Systems: Patient complains of symptoms per HPI as well as the following symptoms; No CP, no SOB, No fever or systemic symptoms. Pertinent negatives per HPI. All others negative.    Social History   Social History  . Marital status: Married    Spouse name: N/A  . Number of children: 1  . Years of education: 16+   Occupational History  . Retired     Social History Main Topics  . Smoking status: Never Smoker  . Smokeless tobacco: Never Used  . Alcohol use 0.0 oz/week     Comment: 3-4 days per week. No more than 2 glasses  . Drug use: No  . Sexual activity: Not on file   Other Topics Concern  . Not on file   Social History Narrative   Lives at home with wife   Caffeine use: Drinks 2 cups coffee in morning   Drinks tea- 2 glasses occasionally     History reviewed. No pertinent family history.  Past Medical History:  Diagnosis Date  . Memory loss   . Restless leg     Past Surgical History:  Procedure Laterality Date  . NO PAST SURGERIES      Current Outpatient Prescriptions  Medication Sig Dispense Refill  . donepezil ([name]) 10 MG tablet TAKE 1 TABLET BY MOUTH AT  BEDTIME 90 tablet 0  . Sulfacetamide Sodium-Sulfur 10-2 % CREA APPLY TO FACE AT BEDTIME  4  . tadalafil (CIALIS)  10 MG tablet Take 10 mg by mouth daily as needed for erectile dysfunction.    . pramipexole (MIRAPEX) 0.125 MG tablet Take 1 tablet (0.125 mg total) by mouth at bedtime. (Patient not taking: Reported on 05/17/2016) 30 tablet 5   No current facility-administered medications for this visit.     Allergies as of 05/17/2016  . (No Known Allergies)    Vitals: BP 121/63   Pulse (!) 47   Ht 5\' 8"  (1.727 m)   Wt 157 lb 6.4 oz (71.4 kg)   BMI 23.93 kg/m  Last Weight:  Wt Readings from Last 1 Encounters:  05/17/16 157 lb 6.4 oz (71.4 kg)   Last Height:   Ht Readings from Last 1 Encounters:  05/17/16 5\' 8"  (1.727 m)   Physical exam: Exam: Gen: NAD, conversant, well nourised, well groomed                     CV: RRR,  no MRG. No Carotid Bruits. No peripheral edema, warm, nontender Eyes: Conjunctivae clear without exudates or hemorrhage  Neuro: Detailed Neurologic Exam  Speech:    Speech is normal; fluent and spontaneous with normal comprehension.  Cognition:  MMSE - Mini Mental State Exam 05/17/2016 11/19/2015  Orientation to time 5 1  Orientation to Place 4 4  Registration 3 3  Attention/ Calculation 5 3  Recall 3 3  Language- name 2 objects 2 2  Language- repeat 1 1  Language- follow 3 step command 3 3  Language- read & follow direction 1 1  Write a sentence 1 1  Copy design 1 1  Total score 29 23   Montreal Cognitive Assessment  11/13/2014  Visuospatial/ Executive (0/5) 5  Naming (0/3) 1  Attention: Read list of digits (0/2) 1  Attention: Read list of letters (0/1) 1  Attention: Serial 7 subtraction starting at 100 (0/3) 3  Language: Repeat phrase (0/2) 2  Language : Fluency (0/1) 1  Abstraction (0/2) 1  Delayed Recall (0/5) 1  Orientation (0/6) 5  Total 21  Adjusted Score (based on education) 21   Cranial Nerves:    The pupils are equal, round, and reactive to light. Visual fields are full to finger confrontation. Extraocular movements are intact. Trigeminal  sensation is intact and the muscles of mastication are normal. The face is symmetric. The palate elevates in the midline. Hearing intact. Voice is normal. Shoulder shrug is normal. The tongue has normal motion without fasciculations.   Gait:    Heel-toe and tandem gait are normal.   Motor Observation:    No asymmetry, no atrophy, and no involuntary movements noted. Tone:    Normal muscle tone.    Posture:    Posture is normal. normal erect    Strength:    Strength is V/V in the upper and lower limbs.      Sensation: intact to LT     Reflex Exam:  DTR's:    Deep tendon reflexes in the upper and lower extremities are normal bilaterally.   Toes:    The toes are downgoing bilaterally.   Clonus:    Clonus is absent.      Assessment/Plan:  This is a very lovely 66 year old male who is here for several years of progressive memory loss with his wife. MMSE 29/20 today, MoCA 21/30. Exam does shows some cognitive impairment more advanced than for age. He is very pleasant and does not appear to have any behavioral disturbances. 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 Limit alcohol to maximum 1-2 drinks a day per guidelines but I suggest less daily.   Discussed [employer]bl[employer]ers with patient and wife extensively.   Naomie DeanAntonia Crytal Pensinger, MD  [facility] [address] [place], [place] [id number]  Phone [phone] Fax [phone]  A total of 30 minutes was spent face-to-face with this patient. Over half this time was spent on counseling patient on the dementia diagnosis and different diagnostic and therapeutic options available.

## 2016-05-17 NOTE — Patient Instructions (Addendum)
Remember to drink plenty of fluid, eat healthy meals and do not skip any meals. Try to eat protein with a every meal and eat a healthy snack such as fruit or nuts in between meals. Try to keep a regular sleep-wake schedule and try to exercise daily, particularly in the form of walking, 20-30 minutes a day, if you can.   As far as your medications are concerned, I would like to suggest: Continue current medications  As far as diagnostic testing: Consider [employer]ers study of FDG PET SCAN  My clinical assistant and will answer any of your questions and relay your messages to me and also relay most of my messages to you.   Our phone number is [phone]. We also have an after hours call service for urgent matters and there is a physician on-call for urgent questions. For any emergencies you know to call 911 or go to the nearest emergency room

## 2016-05-19 ENCOUNTER — Ambulatory Visit: Payer: Medicare Other | Admitting: Nurse Practitioner

## 2016-05-21 ENCOUNTER — Other Ambulatory Visit: Payer: Self-pay | Admitting: Neurology

## 2016-11-21 ENCOUNTER — Other Ambulatory Visit: Payer: Self-pay | Admitting: Diagnostic Neuroimaging

## 2016-11-21 DIAGNOSIS — R413 Other amnesia: Secondary | ICD-10-CM

## 2016-11-21 NOTE — Progress Notes (Signed)
MRI order for Research [id number]. Do not bill patient. Send invoice to [employer].  Suanne Marker, MD 11/21/2016, 6:45 PM Certified in Neurology, Neurophysiology and Neuroimaging  [facility] [address] [place], [place] [address] [phone]

## 2016-12-05 ENCOUNTER — Ambulatory Visit
Admission: RE | Admit: 2016-12-05 | Discharge: 2016-12-05 | Disposition: A | Discharge status: Home / Self Care | Payer: No Typology Code available for payment source | Source: Ambulatory Visit | Attending: Diagnostic Neuroimaging | Admitting: Diagnostic Neuroimaging

## 2016-12-05 DIAGNOSIS — R413 Other amnesia: Secondary | ICD-10-CM

## 2016-12-20 ENCOUNTER — Other Ambulatory Visit: Payer: Self-pay | Admitting: Neurology

## 2016-12-20 DIAGNOSIS — G309 Alzheimer's disease, unspecified: Secondary | ICD-10-CM

## 2016-12-29 ENCOUNTER — Other Ambulatory Visit (INDEPENDENT_AMBULATORY_CARE_PROVIDER_SITE_OTHER): Payer: Medicare Other

## 2016-12-29 DIAGNOSIS — Z0289 Encounter for other administrative examinations: Secondary | ICD-10-CM

## 2017-01-10 ENCOUNTER — Telehealth: Payer: Self-pay | Admitting: Neurology

## 2017-01-10 ENCOUNTER — Other Ambulatory Visit (HOSPITAL_COMMUNITY): Payer: Medicare Other

## 2017-01-10 DIAGNOSIS — E538 Deficiency of other specified B group vitamins: Secondary | ICD-10-CM

## 2017-01-10 NOTE — Telephone Encounter (Signed)
His B12 is borderline low. B12 deficiency can cause weakness, fatigue, easy bruising or bleeding,sore tongue, stomach upset, weight loss, and diarrhea or constipation, tingling or numbness to the fingers and toes, difficulty walking, mood changes, depression, memory loss, disorientation and, in severe cases, dementia. Recommend 1000-2000 mcg B12 daily. recheck B12 in 8 weeks.

## 2017-01-11 NOTE — Telephone Encounter (Signed)
Called and LVM (ok per DPR) informing patient that his B12 lab level is borderline low and that he needs to start taking oral B12 supplements. I left the office number and asked for a call back so we could discuss this in more detail.

## 2017-01-11 NOTE — Telephone Encounter (Addendum)
Pt's wife Guy [place] ([place] returned call. I d/w Guy Payne and the patient the following:    Guy Payne's B12 is borderline low. B12 deficiency can cause symptoms such as weakness, fatigue, easy bruising or bleeding, sore tongue, stomach upset, weight loss, and diarrhea or constipation, tingling or numbness to the fingers and toes, difficulty walking, mood changes, depression, memory loss, disorientation and, in severe cases, dementia. Guy Payne recommends Guy RuizJohn start taking 1000-2000 mcg B12 daily oral which can be purchased OTC. We will recheck his B12 in 8 weeks. She verbalized understanding appreciation and said he will start taking it today.   Labs ordered for 8-12 weeks out per Guy Payne and will call patient as a reminder.

## 2017-01-11 NOTE — Addendum Note (Signed)
Addended by: Bertram SavinULBERTSON, Tammala Weider L on: 01/11/2017 11:06 AM   Modules accepted: Orders

## 2017-01-24 ENCOUNTER — Other Ambulatory Visit (HOSPITAL_COMMUNITY): Payer: Medicare Other

## 2017-03-14 ENCOUNTER — Other Ambulatory Visit (INDEPENDENT_AMBULATORY_CARE_PROVIDER_SITE_OTHER): Payer: Self-pay

## 2017-03-14 ENCOUNTER — Other Ambulatory Visit: Payer: Self-pay | Admitting: Neurology

## 2017-03-14 DIAGNOSIS — E538 Deficiency of other specified B group vitamins: Secondary | ICD-10-CM

## 2017-03-14 DIAGNOSIS — Z0289 Encounter for other administrative examinations: Secondary | ICD-10-CM

## 2017-03-14 NOTE — Telephone Encounter (Signed)
I ordered the lab tests thanks

## 2017-03-14 NOTE — Telephone Encounter (Signed)
Called patient and spoke with Lurena JoinerRebecca his wife ([place]). I informed her that we would like to recheck Carsin's B12 & MMA. She verbalized understanding & her questions were answered. Plan is for patient to come this week anytime during office hours no appointment necessary to have the labs done.

## 2017-03-16 ENCOUNTER — Telehealth: Payer: Self-pay | Admitting: *Deleted

## 2017-03-16 NOTE — Telephone Encounter (Signed)
LVM informing patient his vitamin B12 looks great. Left number for any questions.

## 2017-03-17 LAB — B12 AND FOLATE PANEL
FOLATE: 18.9 ng/mL (ref >3.0)
Vitamin B-12: 1606 pg/mL — ABNORMAL HIGH (ref 232–1245)

## 2017-03-17 LAB — METHYLMALONIC ACID, SERUM: METHYLMALONIC ACID: 301 nmol/L (ref 0–378)

## 2017-05-17 ENCOUNTER — Encounter: Payer: Self-pay | Admitting: Neurology

## 2017-05-17 ENCOUNTER — Ambulatory Visit: Payer: Medicare Other | Admitting: Neurology

## 2017-05-17 ENCOUNTER — Encounter: Payer: Self-pay | Admitting: Psychology

## 2017-05-17 VITALS — BP 108/70 | HR 67 | Ht 68.0 in | Wt 153.0 lb

## 2017-05-17 DIAGNOSIS — R413 Other amnesia: Secondary | ICD-10-CM

## 2017-05-17 NOTE — Patient Instructions (Signed)
Recommend formal neurocognitive testing and FDG PET testing and/or clinical trials   Alzheimer Disease Alzheimer disease is a brain disease that affects memory, thinking, and behavior. People with Alzheimer disease lose mental abilities, and the disease gets worse over time. Survival with Alzheimer disease ranges from several years to as long as 20 years. What are the causes? This condition develops when a protein called beta-amyloid forms deposits in the brain. It is not known what causes these deposits to form. What increases the risk? This condition is more likely to develop in people who:  Are elderly.  Have a family history of dementia.  Have had a brain injury.  Have heart or blood vessel disease.  Have had a stroke.  Have high blood pressure or high cholesterol.  Have diabetes.  What are the signs or symptoms? Symptoms of this condition happen in three stages, which often overlap. Early stage In this stage, you may continue to be independent. You may still be able to drive, work, and be social. Symptoms in this stage include:  Minor memory problems, such as forgetting a name or what you read.  Difficulty with: ? Paying attention. ? Communicating. ? Doing familiar tasks. ? Learning new things.  Needing more time to do daily activities.  Anxiety.  Social withdrawal.  Loss of motivation.  Moderate stage In this stage, you will start to need care. This stage usually lasts the longest. Symptoms in this stage include:  Difficulty with expressing thoughts.  Memory loss that affects daily life. This can include forgetting: ? Your address or phone number. ? Events that have happened. ? Parts of your personal history, like where you went to school.  Confusion about where you are or what time it is.  Difficulty in judging distance.  Changes in personality, mood, and behavior. You may be moody, irritable, angry, frustrated, fearful, anxious, or  suspicious.  Poor reasoning and judgment.  Delusions or hallucinations.  Changes in sleep patterns.  Wandering and getting lost.  Severe stage In the final stage, you will need help with your personal care and dailyactivities. Symptoms in this stage include:  Worsening memory loss.  Personality changes.  Loss of awareness of your surroundings.  Changes in physical abilities, including the ability to walk, sit, and swallow.  Difficulty in communicating.  Inability to control the bladder and bowels.  Increasing confusion.  Increasing disruptive behavior.  How is this diagnosed? This condition is diagnosed with an assessment by your health care provider. During this assessment, your health care provider will talk with you and your family, friends, or caregivers about your symptoms. A thorough medical history will be taken, and you will have a physical exam and tests. Tests may include:  Lab tests, such as blood or urine tests.  Imaging tests, such as a CT scan, PET scan, or MRI.  A lumbar puncture. This test involves removing and testing a small amount of the fluid that surrounds the brain and spinal cord.  An electroencephalogram (EEG). In this test, small metal discs are used to measure electrical activity in the brain.  Memory tests, cognitive tests, and neuropsychological tests. These tests evaluate brain function.  How is this treated? At this time, there is no treatment to cure Alzheimer disease or stop it from getting worse. The goals of treatment are:  To slow down the disease.  To manage behavioral problems.  To provide you with a safe environment.  To make life easier for you and your caregivers.  The  following treatment options are available:  Medicines. Medicines may help to slow down memory loss and control behavioral symptoms.  Talk therapy. Talk therapy provides you with education, support, and memory aids. It is most helpful in the early stages  of the condition.  Counseling or spiritual guidance. It is normal to have a lot of feelings, including anger, relief, fear, and isolation. Counseling and guidance can help you deal with these feelings.  Caregiving. This involves having caregivers help you with your daily activities. Caregivers may be family members, friends, or trained medical professionals. Caregiving can be done at home or outside the home.  Family support groups. These provide education, emotional support, and information about community resources to family members who are taking care of you.  Follow these instructions at home: Medicines  Take over-the-counter and prescription medicines only as told by your health care provider.  Avoid taking medicines that can affect thinking, such as pain or sleeping medicines. Lifestyle   Make healthy lifestyle choices: ? Be physically active as told by your health care provider. ? Do not use any tobacco products, such as cigarettes, chewing tobacco, and e-cigarettes. If you need help quitting, ask your health care provider. ? Eat a healthy diet. ? Practice stress-management techniques when you get stressed. ? Stay social.  Drink enough fluid to keep your urine clear or pale yellow.  Make sure to get quality sleep. These tips can help you get a good night's rest: ? Avoid napping during the day. ? Keep your sleeping area dark and cool. ? Avoid exercising during the few hours before you go to bed. ? Avoid caffeine products in the evening. General instructions  Work with your health care provider to determine what you need help with and what your safety needs are.  If you were given a bracelet that tracks your location, make sure to wear it.  Keep all follow-up visits as told by your health care provider. This is important.  If you have questions or would like additional support, you may contact The Alzheimer's Association: ? 24-hour helpline: (714) 015-2827 ? Website:  LimitLaws.hu Contact a health care provider if:  You have nausea, vomiting, or trouble with eating.  You have dizziness, or weakness.  You have new or worsening trouble with sleeping.  You or your family members become concerned for your safety. Get help right away if:  You develop chest pain or difficulty with breathing.  You pass out. This information is not intended to replace advice given to you by your health care provider. Make sure you discuss any questions you have with your health care provider. Document Released: 10/20/2003 Document Revised: 10/09/2015 Document Reviewed: 11/05/2014 Elsevier Interactive Patient Education  [employer].

## 2017-05-17 NOTE — Progress Notes (Signed)
[name]   Provider:  Dr Lucia Payne Referring Provider: Catha Gosselin, MD Primary Care Physician:  Guy Hillier, MD  CC:  Memory issues  Interval History 05/17/2016: Patient is here for follow-up of dementia. MRI of the brain showed severe atrophy of the mesial and anterior temporal lobes bilaterally consistent with an Alzheimer's type pathology. 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 He has been seen by Guy Payne for PLMS but it doesn't affect him it just bothers his wife. Discussed Trail[employer] with patient and wife extensively.he is in a study at [facility].   Guy Payne 05/14/2015:   Guy Payne Guy Payne is a 67 y.o. male here as a referral from Guy Payne for memory issues.PMHx of glucose intolerance, migraines. He is here with wife who provides information, It is becoming more difficult to remember people's names, directions, conversations. Wife provides information. He says things that are not necessarily true for example as he will not say the right city they went to, word retrieval difficulties. Symptoms started within the last year or two and getting worse. No changes in personality, no behavioral changes, some jokes are more inappropriate than others but this is not necessary anything new. No delusions or hallucinations. He says sometimes when people say things he zones out if it isn't interesting him and maybe that is why he appears to forget things and repeat things they just spoke about. No history of demenita in the family. Denies any episodes of altered mental status. He makes wrong turns a lot. He needs GPS for everything even if he went to it before. Long-term memory is fine. Wife has always managed the finances. 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 No accidents in the home. He enjoys a few glasses of wine 3-4 times a  week. Wife states he has a chin tremor but patient states this is voluntary. No other focal neurologic complaints. 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 Guy Payne, 67 year old male returns for follow-up with history of memory loss with symptoms starting about 2 years ago and worsening. He has more difficulty remembering people's names directions in conversations. He needs GPS for driving. He is retired. 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  Wife also reports today that he has movements of his extremities when he is asleep at night. He is kicking her and she can't stay asleep.  She is not very specific as to how often this occurs she thinks maybe once or twice a week. He also has abnormal movements which sound like myoclonus.  He returns for reevaluation    HPI:  Guy Payne is a 67 y.o. male here as a referral from Guy Payne for memory issues.PMHx of glucose intolerance, migraines.  He is here with wife who provides information, It is becoming more difficult to remember people's names, directions, conversations. Wife provides information. He says things that are not necessarily true for example as he will not say the right city they went to, word retrieval difficulties. Symptoms started within the last year or two and getting worse. No changes in personality, no behavioral changes, some jokes are more inappropriate than  others but this is not necessary anything new. No delusions or hallucinations. He says sometimes when people say things he zones out if it isn't interesting him and maybe that is why he appears to forget things and repeat things they just spoke about. No history of demenita in the family. Denies any episodes of altered mental status. He makes wrong turns a  lot. He needs GPS for everything even if he went to it before. Long-term memory is fine. Wife has always managed the finances. 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 No accidents in the home. He enjoys a few glasses of wine 3-4 times a week. Wife states he has a chin tremor but patient states this is voluntary. No other focal neurologic complaints.   Reviewed notes, labs and imaging from outside physicians, which showed: Reviewed primary care notes. Vitamin B12 and thyroid was checked per primary care notes. Primary care recommended continuing regular exercise which can help memory as well as keeping in mind active with reading, crossword puzzles etc.    Review of Systems: Patient complains of symptoms per HPI as well as the following symptoms; No CP, no SOB, No fever or systemic symptoms. Pertinent negatives per HPI. All others negative.    Social History   Socioeconomic History  . Marital status: Married    Spouse name: Not on file  . Number of children: 1  . Years of education: 16+  . Highest education level: Not on file  Occupational History  . Occupation: Retired   Engineer, production  . Financial resource strain: Not on file  . Food insecurity:    Worry: Not on file    Inability: Not on file  . Transportation needs:    Medical: Not on file    Non-medical: Not on file  Tobacco Use  . Smoking status: Never Smoker  . Smokeless tobacco: Never Used  Substance and Sexual Activity  . Alcohol use: Yes    Alcohol/week: 0.0 oz    Comment: 2 days per week. No more than 2 glasses  . Drug use: No  . Sexual activity: Not on file  Lifestyle  . Physical activity:    Days per week: Not on file    Minutes per session: Not on file  . Stress: Not on file  Relationships  . Social connections:    Talks on phone: Not on file    Gets together: Not on file    Attends religious service: Not on file    Active member of club or organization: Not on  file    Attends meetings of clubs or organizations: Not on file    Relationship status: Not on file  . Intimate partner violence:    Fear of current or ex partner: Not on file    Emotionally abused: Not on file    Physically abused: Not on file    Forced sexual activity: Not on file  Other Topics Concern  . Not on file  Social History Narrative   Lives at home with wife   Caffeine use: Drinks 2 cups coffee in morning   Drinks tea- 2 glasses occasionally     History reviewed. No pertinent family history.  Past Medical History:  Diagnosis Date  . Memory loss   . Restless leg     Past Surgical History:  Procedure Laterality Date  . genital surgery     was not specific  . NO PAST SURGERIES  Current Outpatient Medications  Medication Sig Dispense Refill  . Cyanocobalamin (VITAMIN B12 PO) Take 1 tablet by mouth daily.    Marland Kitchen donepezil ([name]) 10 MG tablet TAKE 1 TABLET BY MOUTH AT  BEDTIME 90 tablet 3  . Sulfacetamide Sodium-Sulfur 10-2 % CREA APPLY TO FACE AT BEDTIME  4   No current facility-administered medications for this visit.     Allergies as of 05/17/2017  . (No Known Allergies)    Vitals: BP 108/70 (BP Location: Right Arm, Patient Position: Sitting)   Pulse 67   Ht 5\' 8"  (1.727 m)   Wt 153 lb (69.4 kg)   BMI 23.26 kg/m  Last Weight:  Wt Readings from Last 1 Encounters:  05/17/17 153 lb (69.4 kg)   Last Height:   Ht Readings from Last 1 Encounters:  05/17/17 5\' 8"  (1.727 m)   Physical exam: Exam: Gen: NAD, conversant, well nourised, well groomed                     CV: RRR, no MRG. No Carotid Bruits. No peripheral edema, warm, nontender Eyes: Conjunctivae clear without exudates or hemorrhage  Neuro: Detailed Neurologic Exam  Speech:    Speech is normal; fluent and spontaneous with normal comprehension.  Cognition:  MMSE - Mini Mental State Exam 05/17/2017 05/17/2016 11/19/2015  Orientation to time 5 5 1   Orientation to Place 4 4 4     Registration 3 3 3   Attention/ Calculation 5 5 3   Recall 3 3 3   Language- name 2 objects 2 2 2   Language- repeat 1 1 1   Language- follow 3 step command 3 3 3   Language- read & follow direction 1 1 1   Write a sentence 1 1 1   Copy design 1 1 1   Total score 29 29 23    Montreal Cognitive Assessment  11/13/2014  Visuospatial/ Executive (0/5) 5  Naming (0/3) 1  Attention: Read list of digits (0/2) 1  Attention: Read list of letters (0/1) 1  Attention: Serial 7 subtraction starting at 100 (0/3) 3  Language: Repeat phrase (0/2) 2  Language : Fluency (0/1) 1  Abstraction (0/2) 1  Delayed Recall (0/5) 1  Orientation (0/6) 5  Total 21  Adjusted Score (based on education) 21   Cranial Nerves:    The pupils are equal, round, and reactive to light. Visual fields are full to finger confrontation. Extraocular movements are intact. Trigeminal sensation is intact and the muscles of mastication are normal. The face is symmetric. The palate elevates in the midline. Hearing intact. Voice is normal. Shoulder shrug is normal. The tongue has normal motion without fasciculations.   Gait:    Heel-toe and tandem gait are normal.   Motor Observation:    No asymmetry, no atrophy, and no involuntary movements noted. Tone:    Normal muscle tone.    Posture:    Posture is normal. normal erect    Strength:    Strength is V/V in the upper and lower limbs.      Sensation: intact to LT     Reflex Exam:  DTR's:    Deep tendon reflexes in the upper and lower extremities are normal bilaterally.   Toes:    The toes are downgoing bilaterally.   Clonus:    Clonus is absent.      Assessment/Plan:  This is a very lovely 67 year old male who is here for several years of progressive memory loss with his wife. MMSE 29/20 today, MoCA 21/30. Exam  does show cognitive impairment more advanced than for age and advanced mesial temporal atrophy. He is very pleasant and does not appear to have any behavioral  disturbances. 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 Limit alcohol to maximum 1-2 drinks a day per guidelines but I suggest less daily.   Discussed [employer]bl[employer]ers with patient and wife extensively. At this point will send for formal neurocognitive testing. Also an FDG-PET scan is a possibility. Discussed Occupational hygienistGraduate and Trailblazers today. She will get back to me about the FDG PET scan. Continue current meds.he likely has Alzheimers.    Naomie DeanAntonia Shyler Hamill, MD  [facility] [address] [place], [place] [id number]  Phone [phone] Fax [phone]  A total of 40 minutes was spent face-to-face with this patient. Over half this time was spent on counseling patient on the dementia diagnosis and different diagnostic and therapeutic options available.

## 2017-06-22 ENCOUNTER — Encounter (HOSPITAL_COMMUNITY)
Admission: RE | Admit: 2017-06-22 | Discharge: 2017-06-22 | Disposition: A | Discharge status: Home / Self Care | Payer: Medicare Other | Source: Ambulatory Visit | Attending: Neurology | Admitting: Neurology

## 2017-06-22 DIAGNOSIS — G309 Alzheimer's disease, unspecified: Secondary | ICD-10-CM | POA: Insufficient documentation

## 2017-07-19 ENCOUNTER — Other Ambulatory Visit: Payer: Self-pay | Admitting: Neurology

## 2017-07-20 ENCOUNTER — Ambulatory Visit: Payer: Medicare Other | Admitting: Psychology

## 2017-07-20 ENCOUNTER — Encounter: Payer: Self-pay | Admitting: Psychology

## 2017-07-20 ENCOUNTER — Ambulatory Visit (INDEPENDENT_AMBULATORY_CARE_PROVIDER_SITE_OTHER): Payer: Medicare Other | Admitting: Psychology

## 2017-07-20 DIAGNOSIS — R413 Other amnesia: Secondary | ICD-10-CM

## 2017-07-20 NOTE — Progress Notes (Signed)
   Neuropsychology Note  Byan Poplaski Leyh completed 60 minutes of neuropsychological testing with technician, Wallace Keller, BS, under the supervision of Dr. Elvis Coil, Licensed Psychologist. The patient did not appear overtly distressed by the testing session, per behavioral observation or via self-report to the technician. Rest breaks were offered.   Clinical Decision Making: In considering the patient's current level of functioning, level of presumed impairment, nature of symptoms, emotional and behavioral responses during the interview, level of literacy, and observed level of motivation/effort, a battery of tests was selected and communicated to the psychometrician.  Communication between the psychologist and technician was ongoing throughout the testing session and changes were made as deemed necessary based on patient performance on testing, technician observations and additional pertinent factors such as those listed above.  Excell Seltzer Mederos will return within approximately 2 weeks for an interactive feedback session with Dr. Alinda Dooms at which time his test performances, clinical impressions and treatment recommendations will be reviewed in detail. The patient understands he can contact our office should he require our assistance before this time.  35 minutes spent performing neuropsychological evaluation services/clinical decision making (psychologist). [CPT 96132] 60 minutes spent face-to-face with patient administering standardized tests, 30 minutes spent scoring (technician). [CPT [id number], 96139]  Full report to follow.

## 2017-07-20 NOTE — Progress Notes (Signed)
NEUROBEHAVIORAL STATUS EXAM   Name: Guy Payne Date of Birth: 1950/06/15 Date of Interview: 07/20/2017  Reason for Referral:  Guy Payne is a 67 y.o. male who is referred for neuropsychological evaluation by Dr. Naomie Dean of [name] due to concerns about memory loss. This patient is accompanied in the office by Guy Payne, Guy Payne, who supplements the history.  History of Presenting Problem:  Guy Payne has been followed by Dr. Lucia Gaskins at [facility] for memory loss since September 2016. At that time, Guy Payne was 21/30. Brain MRI completed on 12/11/2014 reportedly revealed the following: 1.   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 Mild to moderate atrophy elsewhere in the brain. 2.   Minimal extent of her foci in the deep white matter consistent with minimal age-related microvascular ischemic changes. 3.   There are no acute findings. 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 The patient was most recently seen by Dr. Lucia Gaskins on 05/17/2017. MMSE was 29/30. Guy Payne underwent brain PET scan (results unavailable for my review) as part of a research study with [facility]; Guy Payne tells me that the scan did not reveal enough plaque to be a candidate for the study. Guy Payne just completed a research study at [facility].   The patient's Payne reports gradual onset of memory difficulty about 4 years ago with progressive worsening over time. The patient is aware of Guy memory deficits. They report more difficulty recalling recent events and conversations. Guy Payne also has more trouble remembering names but otherwise does not have significant word finding difficulty. Guy Payne has more difficulty recalling directions to places or where places are located that Guy Payne has not been recently. However Guy Payne is able to use a GPS so Guy Payne never gets lost. Guy Payne reports Guy Payne is a good driver. Guy Payne continues to manage Guy  medications with no difficulty (Guy Payne is only on donepezil and vitamin B12 supplement). Guy Payne has always managed the bills but Guy Payne can write a check and complete financial transactions. Guy Payne is able to help Guy Payne prepare meals and Guy Payne can reheat them with no problems. Guy Payne manages the appointments/calendar but Guy Payne remembers them pretty well. They deny all of the following: repetition of statements/questions, misplacing/losing items more frequently, difficulty concentrating, difficulty staying on task, comprehension problems. Guy Payne stays busy with Guy grandchildren, caring for Guy mother in law with dementia, house projects and church. There have been no mood changes and Guy mood is generally positive. There is no psychiatric history. Guy Payne notes that a major issue she and their son have been working with him on is appropriate versus inappropriate joking. Guy Payne has always been a "jokester" but now Guy Payne is more disinhibited, "silly" per Payne, and doesn't recognize when jokes are slightly inappropriate.   Guy Payne denies any physical complaints. Guy Payne exercises regularly at the gym with Guy Payne. Guy Payne sleeps well. Guy appetite is good and it sounds like Guy Payne eats pretty healthy. There have not been any major changes in eating habits.   Guy Payne notes that about 4 years ago Guy Payne did fall backwards off the side of a truck bed and hit the back of Guy head on the ground but Guy Payne did not lose consciousness and was able to get up immediately. Guy Payne did not have any immediate symptoms but she does note that it was soon after this when she started noticing  gradual onset of memory decline.  Guy Payne has no family history of dementia.    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  Marital history: Married x40 years with one son and 3 grandchildren. Alcohol: Guy Payne  has a couple glasses of muscadine wine a couple times a week. Guy Payne has never been a heavy drinker. Tobacco: Never a smoker or tobacco user. SA: No history of substance abuse or dependence/addiction.   Medical History: Past Medical History:  Diagnosis Date  . Memory loss   . Restless leg     Current Medications:  Outpatient Encounter Medications as of 07/20/2017  Medication Sig  . Cyanocobalamin (VITAMIN B12 PO) Take 1 tablet by mouth daily.  Marland Payne donepezil ([name]) 10 MG tablet TAKE 1 TABLET BY MOUTH AT  BEDTIME  . Sulfacetamide Sodium-Sulfur 10-2 % CREA APPLY TO FACE AT BEDTIME   No facility-administered encounter medications on file as of 07/20/2017.     Behavioral Observations:   Appearance: Neatly and appropriately dressed and groomed, appearing somewhat younger than Guy stated age. Guy Payne was noted to fidget Guy legs frequently at the beginning of the session. Gait: Ambulated independently, no gross abnormalities observed Speech: Fluent; normal rate, rhythm and volume. Mild word finding difficulty. Thought process: Linear, goal directed Affect: Full, euthymic, jovial/silly at times, possibly slightly anxious Interpersonal: Pleasant, appropriate   45 minutes spent face-to-face with patient completing neurobehavioral status exam. 35 minutes spent integrating medical records/clinical data and completing this report. CPT code [phone] unit.   TESTING: There is medical necessity to proceed with neuropsychological assessment as the results will be used to aid in differential diagnosis and clinical decision-making and to inform specific treatment recommendations. Per the patient, Guy Payne and medical records reviewed, there has been a change in cognitive functioning and a reasonable suspicion of Alzheimer's disease.  Clinical Decision Making: In considering the patient's current level of functioning, level of presumed impairment, nature of symptoms, emotional and behavioral responses during  the interview, level of literacy, and observed level of motivation, a battery of tests was selected and communicated to the psychometrician.    Following the clinical interview/neurobehavioral status exam, the patient completed this full battery of neuropsychological testing with my psychometrician under my supervision (see separate note).   PLAN: The patient will return to see me for a follow-up session at which time Guy test performances and my impressions and treatment recommendations will be reviewed in detail.  Evaluation ongoing; full report to follow.

## 2017-08-13 NOTE — Progress Notes (Signed)
NEUROPSYCHOLOGICAL EVALUATION   Name:    Guy Payne  Date of Birth:   [birth year] Date of Interview:  07/20/2017 Date of Testing:  07/20/2017   Date of Feedback:  08/15/2017       Background Information:  Reason for Referral:  ADRIANN BALLWEG is a 67 y.o. male referred by Dr. Naomie Dean of [name] to assess his current level of cognitive functioning and assist in differential diagnosis. The current evaluation consisted of a review of available medical records, an interview with the patient and his wife, and the completion of a neuropsychological testing battery. Informed consent was obtained.  History of Presenting Problem: Mr. Thier has been followed by Dr. Lucia Gaskins at [facility] for memory loss since September 2016. At that time, MoCA was 21/30. Brain MRI completed on 12/11/2014 reportedly revealed the following: 1. 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 Mild to moderate atrophy elsewhere in the brain. 2. Minimal extent of her foci in the deep white matter consistent with minimal age-related microvascular ischemic changes. 3. There are no acute findings.  He has had a sleep study which apparently revealed 150 periodic limb movements of sleep and a low dose of Mirapex was started. The patient was most recently seen by Dr. Lucia Gaskins on 05/17/2017. MMSE was 29/30. He underwent brain PET scan (results unavailable for my review) as part of a research study with [facility]; his wife tells me that the scan did not reveal "enough plaques" for him to be a candidate for the study. He just completed a research study at [facility].   The patient's wife reports gradual onset of memory difficulty about 4 years ago with progressive worsening over time. The patient is aware of his memory deficits. They report more difficulty recalling recent events and conversations. He also has more trouble remembering names but otherwise does not have significant word finding  difficulty. He has more difficulty recalling directions to places or where places are located that he has not been recently. However he is able to use a GPS so he never gets lost. His wife reports he is a good driver. He continues to manage his medications with no difficulty (he is only on donepezil and vitamin B12 supplement). His wife has always managed the bills but he can write a check and complete financial transactions. He is able to help his wife prepare meals and he can reheat them with no problems. His wife manages the appointments/calendar but he remembers them pretty well. They deny all of the following: repetition of statements/questions, misplacing/losing items more frequently, difficulty concentrating, difficulty staying on task, comprehension problems. He stays busy with his grandchildren, caring for his mother in law with dementia, house projects and church. There have been no mood changes and his mood is generally positive. There is no psychiatric history. His wife notes that a major issue she and their son have been working with him on is appropriate versus inappropriate joking. He has always been a "jokester" but now he is more disinhibited, "silly" per wife, and doesn't recognize when jokes are slightly inappropriate.   He denies any physical complaints. He exercises regularly at the gym with his wife. He sleeps well. His appetite is good and it sounds like he eats pretty healthy. There have not been any major changes in eating habits.   His wife notes that about 4 years ago he did fall backwards off the side of a truck bed and hit the back  of his head on the ground but he did not lose consciousness and was able to get up immediately. He did not have any immediate symptoms but she does note that it was soon after this when she started noticing gradual onset of memory decline.  He has no family history of dementia.    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  Marital history: Married x40 years with one son and 3 grandchildren. Alcohol: He has a couple glasses of muscadine wine a couple times a week. He has never been a heavy drinker. Tobacco: Never a smoker or tobacco user. SA: No history of substance abuse or dependence/addiction.   Medical History:  Past Medical History:  Diagnosis Date  . Memory loss   . Restless leg     Current medications:  Outpatient Encounter Medications as of 08/15/2017  Medication Sig  . Cyanocobalamin (VITAMIN B12 PO) Take 1 tablet by mouth daily.  Marland Kitchen donepezil ([name]) 10 MG tablet TAKE 1 TABLET BY MOUTH AT  BEDTIME  . Sulfacetamide Sodium-Sulfur 10-2 % CREA APPLY TO FACE AT BEDTIME   No facility-administered encounter medications on file as of 08/15/2017.      Current Examination:  Behavioral Observations:  Appearance: Neatly and appropriately dressed and groomed, appearing somewhat younger than his stated age. He was noted to fidget his legs frequently at the beginning of the session. Gait: Ambulated independently, no gross abnormalities observed Speech: Fluent; normal rate, rhythm and volume. Mild word finding difficulty. Thought process: Linear, goal directed Affect: Full, euthymic, jovial/silly at times, possibly slightly anxious Interpersonal: Pleasant, appropriate Orientation: Oriented to all spheres. Accurately named the current President and his predecessor.   Tests Administered: . Test of Premorbid Functioning (TOPF) . Wechsler Adult Intelligence Scale-Fourth Edition (WAIS-IV): Similarities, Clinical cytogeneticist, Coding and Digit Span subtests . Wechsler Memory Scale-Fourth Edition (WMS-IV) Older Adult Version (ages 60-90): Logical Memory I, II and Recognition subtests  . DIRECTV Verbal Learning Test - 2nd Edition (CVLT-2) Short  Form . Repeatable Battery for the Assessment of Neuropsychological Status (RBANS) Form A:  Figure Copy and Recall subtests and Semantic Fluency subtest . Boston Naming Test (BNT) . Boston Diagnostic Aphasia Examination: Complex Ideational Material subtest . Controlled Oral Word Association Test (COWAT) . Trail Making Test A and B . Clock drawing test . Beck Depression Inventory - 2nd Edition (BDI-II) . Generalized Anxiety Disorder - 7 item screener (GAD-7)  Test Results: Note: Standardized scores are presented only for use by appropriately trained professionals and to allow for any future test-retest comparison. These scores should not be interpreted without consideration of all the information that is contained in the rest of the report. The most recent standardization samples from the test publisher or other sources were used whenever possible to derive standard scores; scores were corrected for age, gender, ethnicity and education when available.   Test Scores:  Test Name Raw Score Standardized Score Descriptor  TOPF 43/70 SS= 101 Average  WAIS-IV Subtests     Similarities 15/36 ss= 6 Low average  Block Design 32/66 ss= 10 Average  Coding 51/135 ss= 9 Average  Digit Span Forward 6/16 ss= 5 Borderline  Digit Span Backward 7/16 ss= 9 Average  WMS-IV Subtests     LM I 10/53 ss= 2 Impaired  LM II 3/39 ss= 3 Impaired  LM  II Recognition 11/23 Cum %: <2   RBANS Subtests     Figure Copy 17/20 Z= -0.7 Average  Figure Recall 12/20 Z= -0.4 Average  Semantic Fluency 12 Z= -2 Impaired  CVLT-II Scores     Trial 1 3/9 Z= -3 Severely impaired  Trial 4 5/9 Z= -2 Impaired  Trials 1-4 total 16/36 T= 28 Impaired  SD Free Recall 5/9 Z= -1 Low average  LD Free Recall 2/9 Z= -1.5 Borderline  LD Cued Recall 2/9 Z= -2.5 Impaired  Recognition Hits 8/9 hits Z= 0 Average  Recognition False Positives 5 Z = -2 Impaired  Forced Choice Recognition 8/9  Impaired  BNT 20/60 T<10 Severely impaired  BDAE  Subtest     Complex Ideational Material 10/12  WNL  COWAT-FAS 45 T= 52 Average  COWAT-Animals 13 T= 38 Low average  Trail Making Test A  30" 0 errors T= 55 Average  Trail Making Test B  72" 0 errors T= 52 Average  Clock Drawing   WNL  BDI-II 7/63  WNL  GAD-7 2/21  WNL      Description of Test Results:  Premorbid verbal intellectual abilities were estimated to have been within the average range based on a test of word reading. Psychomotor processing speed was average. Basic auditory attention was borderline but, interestingly, performance was improved on a more difficult working memory task (average). Visual-spatial construction was average. Language abilities were variable. Specifically, confrontation naming was severely impaired, and semantic verbal fluency ranged from low average (for animals) to severely impaired (for fruits/vegetables). Auditory comprehension of complex ideational material was within normal limits. With regard to verbal memory, encoding and acquisition of non-contextual information (i.e., word list) was impaired. After a brief distracter task, free recall was low average (5/9 items). After a delay, free recall was borderline (2/9 items). Cued recall was impaired (2/9 items). Performance on a yes/no recognition task was impaired due to high number of false positive errors. Performance on a forced choice recognition task also was impaired. On another verbal memory test, encoding and acquisition of contextual auditory information (i.e., short stories) was impaired. After a delay, free recall was impaired. Performance on a yes/no recognition task was severely impaired (at chance level). With regard to non-verbal memory, delayed free recall of visual information was average. Executive functioning was normal overall. Mental flexibility and set-shifting were average on Trails B. Verbal fluency with phonemic search restrictions was average. Verbal abstract reasoning was low average.  Performance on a clock drawing task was normal. On self-report measures of mood, the patient's responses were not indicative of clinically significant depression or anxiety at the present time. He denied suicidal ideation or intention.   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  Test results revealed severe impairment in confrontation naming and verbal memory, along with mild decline in verbal abstract reasoning and semantic fluency.  This cognitive profile is highly suggestive of medial temporal lobe involvement and consistent with MRI brain in 2016 showing preferential atrophy in this area. 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 Fortunately, mood is good. Mild behavioral/personality changes noted by family are likely secondary to dementia.     Recommendations/Plan: Based on the findings of the present evaluation, the following recommendations are offered:  1. Continue physical exercise and regular schedule of variety of activities including  social interaction.  2. Continue donepezil 3. Continue use of external memory aids including GPS - the patient appears safe to drive with GPS, I would not recommend driving out of the local area by himself. 4. Information regarding MCI was provided to the patient and his wife. His wife/family may benefit from attending caregiver support group or attaining information/resources through the Alzheimer's Association (LimitLaws.hu).    Feedback to Patient: Kaulder Zahner Albino and his wife returned for a feedback appointment on 08/15/2017 to review the results of his neuropsychological evaluation with this provider. 20 minutes face-to-face time was spent reviewing his test results, my impressions and my recommendations as detailed above.    Total time  spent on this patient's case: 80 minutes for neurobehavioral status exam with psychologist (CPT code 27253); 90 minutes of testing/scoring by psychometrician under psychologist's supervision (CPT codes [phone], [phone] units); 180 minutes for integration of patient data, interpretation of standardized test results and clinical data, clinical decision making, treatment planning and preparation of this report, and interactive feedback with review of results to the patient/family by psychologist (CPT codes [phone], [phone] units).      Thank you for your referral of Raiden Haydu Burgueno. Please feel free to contact me if you have any questions or concerns regarding this report.

## 2017-08-15 ENCOUNTER — Ambulatory Visit (INDEPENDENT_AMBULATORY_CARE_PROVIDER_SITE_OTHER): Payer: Medicare Other | Admitting: Psychology

## 2017-08-15 ENCOUNTER — Encounter: Payer: Self-pay | Admitting: Psychology

## 2017-08-15 DIAGNOSIS — G3184 Mild cognitive impairment, so stated: Secondary | ICD-10-CM | POA: Diagnosis not present

## 2017-08-15 NOTE — Patient Instructions (Signed)
Based on the findings of the present evaluation, the following recommendations are offered:  1. Continue physical exercise and regular schedule of variety of activities including social interaction.  2. Continue donepezil 3. Continue use of external memory aids including GPS - the patient appears safe to drive with GPS, I would not recommend driving out of the local area by himself. 4. Information regarding MCI was provided to the patient and his wife. His wife may benefit from attending caregiver support group or attaining information/resources through the Alzheimer's Association ([email or web address]).

## 2018-03-09 MED FILL — SHINGRIX 50 MCG SUS: 50 | 1 days supply | Qty: 1 | Fill #0

## 2018-05-22 ENCOUNTER — Ambulatory Visit: Payer: Medicare Other | Admitting: Neurology

## 2018-07-19 ENCOUNTER — Ambulatory Visit: Payer: Self-pay | Admitting: Neurology

## 2018-07-23 MED FILL — SHINGRIX 50 MCG SUS: 50 | 1 days supply | Qty: 1 | Fill #1

## 2018-07-25 ENCOUNTER — Ambulatory Visit: Payer: Self-pay | Admitting: Neurology

## 2018-08-02 ENCOUNTER — Other Ambulatory Visit: Payer: Self-pay | Admitting: Neurology

## 2018-08-21 ENCOUNTER — Other Ambulatory Visit: Payer: Self-pay

## 2018-08-21 ENCOUNTER — Encounter: Payer: Self-pay | Admitting: Neurology

## 2018-08-21 ENCOUNTER — Ambulatory Visit: Payer: Medicare Other | Admitting: Neurology

## 2018-08-21 VITALS — BP 118/72 | HR 54 | Temp 97.5°F | Ht 68.0 in | Wt 140.0 lb

## 2018-08-21 DIAGNOSIS — G3184 Mild cognitive impairment, so stated: Secondary | ICD-10-CM

## 2018-08-21 MED ORDER — MEMANTINE HCL 5 MG PO TABS: 5.0000 mg | ORAL_TABLET | Freq: Two times a day (BID) | ORAL | 6 refills | Status: DC

## 2018-08-21 NOTE — Progress Notes (Signed)
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 Patient here for follow up. He also has RLS and was evaluated with sleep study in 2017 without any significant parasomnia or sleep apnea. They finished the Healthy Brain Study at [facility] and he is in a research study he has to wear a patch. He is doing word searches on his phone, he works out in the yard, staying social as much as they can in [employer]. He has had his Vitamin D checked and is normal, discussed keeping Vitamin D normal as it plays an important role in disease processes.  He recently had memory testing with [facility] and do not feel the need to repeat his formal neurocog testing. No swallowing problems, no physical issues, doing well physically and mentally.   Interval History 05/17/2017: Patient is here for follow-up of dementia. MRI of the brain showed severe atrophy of the mesial and anterior temporal lobes bilaterally consistent with an Alzheimer's type pathology. 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 He has been seen by Guy Payne for PLMS but it doesn't affect him it just bothers his wife. Discussed Trailblazers with patient and wife extensively.he is in a study at [place] 05/14/2015:   Guy Payne is a 68 y.o. male here as a referral from [facility] for memory issues.PMHx of glucose intolerance, migraines. He is here with wife who provides information, It is becoming more difficult to remember people's names, directions, conversations. Wife provides information. He says things that are not necessarily true for example as he will not say  the right city they went to, word retrieval difficulties. Symptoms started within the last year or two and getting worse. No changes in personality, no behavioral changes, some jokes are more inappropriate than others but this is not necessary anything new. No delusions or hallucinations. He says sometimes when people say things he zones out if it isn't interesting him and maybe that is why he appears to forget things and repeat things they just spoke about. No history of demenita in the family. Denies any episodes of altered mental status. He makes wrong turns a lot. He needs GPS for everything even if he went to it before. Long-term memory is fine. Wife has always managed the finances. 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 No accidents in the home. He enjoys a few glasses of wine 3-4 times a week. Wife states he has a chin tremor but patient states this is voluntary. No other focal neurologic complaints. 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 Guy Payne, 68 year old male returns for follow-up with history of memory loss with symptoms starting about 2 years ago and worsening. He has more difficulty remembering people's names directions in conversations. He needs GPS for driving. He is retired. 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  Wife also reports today that he has movements of  his extremities when he is asleep at night. He is kicking her and she can't stay asleep.  She is not very specific as to how often this occurs she thinks maybe once or twice a week. He also has abnormal movements which sound like myoclonus.  He returns for reevaluation   HPI:  Guy Payne is a 68 y.o. male here as a referral from [facility] for memory  issues.PMHx of glucose intolerance, migraines.  He is here with wife who provides information, It is becoming more difficult to remember people's names, directions, conversations. Wife provides information. He says things that are not necessarily true for example as he will not say the right city they went to, word retrieval difficulties. Symptoms started within the last year or two and getting worse. No changes in personality, no behavioral changes, some jokes are more inappropriate than others but this is not necessary anything new. No delusions or hallucinations. He says sometimes when people say things he zones out if it isn't interesting him and maybe that is why he appears to forget things and repeat things they just spoke about. No history of demenita in the family. Denies any episodes of altered mental status. He makes wrong turns a lot. He needs GPS for everything even if he went to it before. Long-term memory is fine. Wife has always managed the finances. 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 No accidents in the home. He enjoys a few glasses of wine 3-4 times a week. Wife states he has a chin tremor but patient states this is voluntary. No other focal neurologic complaints.   Reviewed notes, labs and imaging from outside physicians, which showed: Reviewed primary care notes. Vitamin B12 and thyroid was checked per primary care notes. Primary care recommended continuing regular exercise which can help memory as well as keeping in mind active with reading, crossword puzzles etc.    Review of Systems: Patient complains of symptoms per HPI as well as the following symptoms; No CP, no SOB, No fever or systemic symptoms. Pertinent negatives per HPI. All others negative.    Social History   Socioeconomic History  . Marital status: Married    Spouse name: Not on file  . Number of children: 1  . Years of education: 16+  . Highest education level:  Not on file  Occupational History  . Occupation: Retired   Engineer, productionocial Needs  . Financial resource strain: Not on file  . Food insecurity    Worry: Not on file    Inability: Not on file  . Transportation needs    Medical: Not on file    Non-medical: Not on file  Tobacco Use  . Smoking status: Never Smoker  . Smokeless tobacco: Never Used  Substance and Sexual Activity  . Alcohol use: Yes    Alcohol/week: 0.0 standard drinks    Comment: 2 days per week. No more than 2 glasses  . Drug use: No  . Sexual activity: Not on file  Lifestyle  . Physical activity    Days per week: Not on file    Minutes per session: Not on file  . Stress: Not on file  Relationships  . Social Musicianconnections    Talks on phone: Not on file    Gets together: Not on file    Attends religious service: Not on file    Active member of club or organization: Not on file  Attends meetings of clubs or organizations: Not on file    Relationship status: Not on file  . Intimate partner violence    Fear of current or ex partner: Not on file    Emotionally abused: Not on file    Physically abused: Not on file    Forced sexual activity: Not on file  Other Topics Concern  . Not on file  Social History Narrative   Lives at home with wife   Caffeine use: Drinks 2 cups coffee in morning   Drinks tea- 2 glasses occasionally     History reviewed. No pertinent family history.  Past Medical History:  Diagnosis Date  . Memory loss   . Prostate disorder    blockage  . Restless leg     Past Surgical History:  Procedure Laterality Date  . genital surgery     was not specific  . NO PAST SURGERIES    . prostate repair for blockage      Current Outpatient Medications  Medication Sig Dispense Refill  . Cyanocobalamin (VITAMIN B12 PO) Take 1 tablet by mouth daily.    Marland Kitchen. donepezil ([name]) 10 MG tablet TAKE 1 TABLET BY MOUTH AT  BEDTIME 90 tablet 3  . Sulfacetamide Sodium-Sulfur 10-2 % CREA APPLY TO FACE AT BEDTIME   4  . memantine ([name]) 5 MG tablet Take 1 tablet (5 mg total) by mouth 2 (two) times daily. 60 tablet 6   No current facility-administered medications for this visit.     Allergies as of 08/21/2018  . (No Known Allergies)    Vitals: BP 118/72 (BP Location: Left Arm, Patient Position: Sitting)   Pulse (!) 54   Temp (!) 97.5 F (36.4 C) Comment: wife temp 98.4. both taken  by front desk.  Ht 5\' 8"  (1.727 m)   Wt 140 lb (63.5 kg)   BMI 21.29 kg/m  Last Weight:  Wt Readings from Last 1 Encounters:  08/21/18 140 lb (63.5 kg)   Last Height:   Ht Readings from Last 1 Encounters:  08/21/18 5\' 8"  (1.727 m)   Physical exam: Exam: Gen: NAD, conversant, well nourised, well groomed                     CV: RRR, no MRG. No Carotid Bruits. No peripheral edema, warm, nontender Eyes: Conjunctivae clear without exudates or hemorrhage  Neuro: Detailed Neurologic Exam  Speech:    Speech is normal; fluent and spontaneous with normal comprehension.  Cognition:  MMSE - Mini Mental State Exam 05/17/2017 05/17/2016 11/19/2015  Orientation to time 5 5 1   Orientation to Place 4 4 4   Registration 3 3 3   Attention/ Calculation 5 5 3   Recall 3 3 3   Language- name 2 objects 2 2 2   Language- repeat 1 1 1   Language- follow 3 step command 3 3 3   Language- read & follow direction 1 1 1   Write a sentence 1 1 1   Copy design 1 1 1   Total score 29 29 23    Montreal Cognitive Assessment  11/13/2014  Visuospatial/ Executive (0/5) 5  Naming (0/3) 1  Attention: Read list of digits (0/2) 1  Attention: Read list of letters (0/1) 1  Attention: Serial 7 subtraction starting at 100 (0/3) 3  Language: Repeat phrase (0/2) 2  Language : Fluency (0/1) 1  Abstraction (0/2) 1  Delayed Recall (0/5) 1  Orientation (0/6) 5  Total 21  Adjusted Score (based on education) 21   Cranial  Nerves:    The pupils are equal, round, and reactive to light. Visual fields are full to finger confrontation. Extraocular  movements are intact. Trigeminal sensation is intact and the muscles of mastication are normal. The face is symmetric. The palate elevates in the midline. Hearing intact. Voice is normal. Shoulder shrug is normal. The tongue has normal motion without fasciculations.   Gait:    Heel-toe and tandem gait are normal.   Motor Observation:    No asymmetry, no atrophy, and no involuntary movements noted. Tone:    Normal muscle tone.    Posture:    Posture is normal. normal erect    Strength:    Strength is V/V in the upper and lower limbs.      Sensation: intact to LT     Reflex Exam:  DTR's:    Deep tendon reflexes in the upper and lower extremities are normal bilaterally.   Toes:    The toes are downgoing bilaterally.   Clonus:    Clonus is absent.      Assessment/Plan:  This is a very lovely 68 year old male who is here for several years of progressive memory loss with his wife. MMSE 29/20 today, MoCA 21/30. Exam does show cognitive impairment more advanced than for age and advanced mesial temporal atrophy. He is very pleasant and does not appear to have any behavioral disturbances. 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 Limit alcohol to maximum 1-2 drinks a day per guidelines but I suggest less daily.   Discussed Trailblazers with patient and wife extensively. 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 08/15/2017. Patient here for follow up.   Wife always present, provides most information. Patient is very pleasant and funny, no mood disorders or behavioral issues.   - Continue to follow B12 and Vitamin D with pcp - Start [name]. Indicated in moderate dementia, but it may help starting it earlier, won;t hurt unless he has side effects then stop - mind diet, discussed - genetic testing can be expensive, 23 and me offers epoe4 genetic testing if they are interested at a reasonable  price - [facility] they can participate in. Also discussed with our Designer, industrial/productresearch coordinator.  - feels stable, no need to repeat formal testing now (also [facility] and they get yearly testing there, I do not have access to that research data) - Discussed memory compensation strategies, staying social and active  Naomie DeanAntonia Ahern, MD  [facility] [address] [place], [place] [id number]  Phone [phone] Fax [phone]  A total of 40 minutes was spent face-to-face with this patient. Over half this time was spent on counseling patient on the  1. 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

## 2018-08-21 NOTE — Patient Instructions (Addendum)
Start [name] Watch B12 and Vitamin D levels  Memantine Tablets What is this medicine? MEMANTINE (MEM an teen) is used to treat dementia caused by Alzheimer's disease. This medicine may be used for other purposes; ask your health care provider or pharmacist if you have questions. COMMON BRAND NAME(S): [name] What should I tell my health care provider before I take this medicine? They need to know if you have any of these conditions:  difficulty passing urine  kidney disease  liver disease  seizures  an unusual or allergic reaction to memantine, other medicines, foods, dyes, or preservatives  pregnant or trying to get pregnant  breast-feeding How should I use this medicine? Take this medicine by mouth with a glass of water. Follow the directions on the prescription label. You may take this medicine with or without food. Take your doses at regular intervals. Do not take your medicine more often than directed. Continue to take your medicine even if you feel better. Do not stop taking except on the advice of your doctor or health care professional. Talk to your pediatrician regarding the use of this medicine in children. Special care may be needed. Overdosage: If you think you have taken too much of this medicine contact a poison control center or emergency room at once. NOTE: This medicine is only for you. Do not share this medicine with others. What if I miss a dose? If you miss a dose, take it as soon as you can. If it is almost time for your next dose, take only that dose. Do not take double or extra doses. If you do not take your medicine for several days, contact your health care provider. Your dose may need to be changed. What may interact with this medicine?  acetazolamide  amantadine  cimetidine  dextromethorphan  dofetilide  hydrochlorothiazide  ketamine  metformin  methazolamide  quinidine  ranitidine  sodium bicarbonate  triamterene This list may not  describe all possible interactions. Give your health care provider a list of all the medicines, herbs, non-prescription drugs, or dietary supplements you use. Also tell them if you smoke, drink alcohol, or use illegal drugs. Some items may interact with your medicine. What should I watch for while using this medicine? Visit your doctor or health care professional for regular checks on your progress. Check with your doctor or health care professional if there is no improvement in your symptoms or if they get worse. You may get drowsy or dizzy. Do not drive, use machinery, or do anything that needs mental alertness until you know how this drug affects you. Do not stand or sit up quickly, especially if you are an older patient. This reduces the risk of dizzy or fainting spells. Alcohol can make you more drowsy and dizzy. Avoid alcoholic drinks. What side effects may I notice from receiving this medicine? Side effects that you should report to your doctor or health care professional as soon as possible:  allergic reactions like skin rash, itching or hives, swelling of the face, lips, or tongue  agitation or a feeling of restlessness  depressed mood  dizziness  hallucinations  redness, blistering, peeling or loosening of the skin, including inside the mouth  seizures  vomiting Side effects that usually do not require medical attention (report to your doctor or health care professional if they continue or are bothersome):  constipation  diarrhea  headache  nausea  trouble sleeping This list may not describe all possible side effects. Call your doctor for medical  advice about side effects. You may report side effects to FDA at 1-800-FDA-1088. Where should I keep my medicine? Keep out of the reach of children. Store at room temperature between 15 degrees and 30 degrees C (59 degrees and 86 degrees F). Throw away any unused medicine after the expiration date. NOTE: This sheet is a  summary. It may not cover all possible information. If you have questions about this medicine, talk to your doctor, pharmacist, or health care provider.  2020 Elsevier/Gold Standard (2012-11-26 14:10:42)   Memory Compensation Strategies  1. Use "WARM" strategy.  W= write it down  A= associate it  R= repeat it  M= make a mental note  2.   You can keep a Glass blower/designerMemory Notebook.  Use a 3-ring notebook with sections for the following: calendar, important names and phone numbers,  medications, doctors' names/phone numbers, lists/reminders, and a section to journal what you did  each day.   3.    Use a calendar to write appointments down.  4.    Write yourself a schedule for the day.  This can be placed on the calendar or in a separate section of the Memory Notebook.  Keeping a  regular schedule can help memory.  5.    Use medication organizer with sections for each day or morning/evening pills.  You may need help loading it  6.    Keep a basket, or pegboard by the door.  Place items that you need to take out with you in the basket or on the pegboard.  You may also want to  include a message board for reminders.  7.    Use sticky notes.  Place sticky notes with reminders in a place where the task is performed.  For example: " turn off the  stove" placed by the stove, "lock the door" placed on the door at eye level, " take your medications" on  the bathroom mirror or by the place where you normally take your medications.  8.    Use alarms/timers.  Use while cooking to remind yourself to check on food or as a reminder to take your medicine, or as a  reminder to make a call, or as a reminder to perform another task, etc.

## 2018-09-12 ENCOUNTER — Other Ambulatory Visit: Payer: Self-pay | Admitting: Neurology

## 2018-09-19 ENCOUNTER — Telehealth: Payer: Self-pay | Admitting: Neurology

## 2018-09-19 NOTE — Telephone Encounter (Signed)
I reached out to the pt's wife she states the pharmacy is continuing to contact her in regards to his namenda refill. Dr. Jaynee Eagles advised on 08/31/2018 for the pt to d/c this med. I contacted [facility] and spoke with Kieth Brightly and advised to cancel the rx out at this time.

## 2018-09-19 NOTE — Telephone Encounter (Signed)
Pt's wife called stating that the pharmacy keeps texting her about medications that are ready to be picked up that supposedly the office called in for the pt but the pt is not on the medications. Pt's wife would like to why the medication keeps being called in for the pt when the provider has taken him off of it. Please advise.

## 2019-02-22 DIAGNOSIS — W19XXXA Unspecified fall, initial encounter: Secondary | ICD-10-CM

## 2019-02-22 HISTORY — DX: Unspecified fall, initial encounter: W19.XXXA

## 2019-05-06 DIAGNOSIS — H524 Presbyopia: Secondary | ICD-10-CM | POA: Diagnosis not present

## 2019-08-22 ENCOUNTER — Ambulatory Visit: Payer: Medicare PPO | Admitting: Neurology

## 2019-08-22 ENCOUNTER — Encounter: Payer: Self-pay | Admitting: Neurology

## 2019-08-22 ENCOUNTER — Other Ambulatory Visit: Payer: Self-pay

## 2019-08-22 VITALS — BP 103/58 | HR 64 | Ht 68.0 in | Wt 150.0 lb

## 2019-08-22 DIAGNOSIS — G3184 Mild cognitive impairment, so stated: Secondary | ICD-10-CM | POA: Diagnosis not present

## 2019-08-22 MED ORDER — DONEPEZIL HCL 10 MG PO TABS: 10.0000 mg | ORAL_TABLET | Freq: Every day | ORAL | 3 refills | Status: DC

## 2019-08-22 NOTE — Patient Instructions (Signed)
Donepezil tablets What is this medicine? DONEPEZIL (doe NEP e zil) is used to treat mild to moderate dementia caused by Alzheimer's disease. This medicine may be used for other purposes; ask your health care provider or pharmacist if you have questions. COMMON BRAND NAME(S): [name] What should I tell my health care provider before I take this medicine? They need to know if you have any of these conditions:  asthma or other lung disease  difficulty passing urine  head injury  heart disease  history of irregular heartbeat  liver disease  seizures (convulsions)  stomach or intestinal disease, ulcers or stomach bleeding  an unusual or allergic reaction to donepezil, other medicines, foods, dyes, or preservatives  pregnant or trying to get pregnant  breast-feeding How should I use this medicine? Take this medicine by mouth with a glass of water. Follow the directions on the prescription label. You may take this medicine with or without food. Take this medicine at regular intervals. This medicine is usually taken before bedtime. Do not take it more often than directed. Continue to take your medicine even if you feel better. Do not stop taking except on your doctor's advice. If you are taking the 23 mg donepezil tablet, swallow it whole; do not cut, crush, or chew it. Talk to your pediatrician regarding the use of this medicine in children. Special care may be needed. Overdosage: If you think you have taken too much of this medicine contact a poison control center or emergency room at once. NOTE: This medicine is only for you. Do not share this medicine with others. What if I miss a dose? If you miss a dose, take it as soon as you can. If it is almost time for your next dose, take only that dose, do not take double or extra doses. What may interact with this medicine? Do not take this medicine with any of the following medications:  certain medicines for fungal infections like  itraconazole, fluconazole, posaconazole, and voriconazole  cisapride  dextromethorphan; quinidine  dronedarone  pimozide  quinidine  thioridazine This medicine may also interact with the following medications:  antihistamines for allergy, cough and cold  atropine  bethanechol  carbamazepine  certain medicines for bladder problems like oxybutynin, tolterodine  certain medicines for Parkinson's disease like benztropine, trihexyphenidyl  certain medicines for stomach problems like dicyclomine, hyoscyamine  certain medicines for travel sickness like scopolamine  dexamethasone  dofetilide  ipratropium  NSAIDs, medicines for pain and inflammation, like ibuprofen or naproxen  other medicines for Alzheimer's disease  other medicines that prolong the QT interval (cause an abnormal heart rhythm)  phenobarbital  phenytoin  rifampin, rifabutin or rifapentine  ziprasidone This list may not describe all possible interactions. Give your health care provider a list of all the medicines, herbs, non-prescription drugs, or dietary supplements you use. Also tell them if you smoke, drink alcohol, or use illegal drugs. Some items may interact with your medicine. What should I watch for while using this medicine? Visit your doctor or health care professional for regular checks on your progress. Check with your doctor or health care professional if your symptoms do not get better or if they get worse. You may get drowsy or dizzy. Do not drive, use machinery, or do anything that needs mental alertness until you know how this drug affects you. What side effects may I notice from receiving this medicine? Side effects that you should report to your doctor or health care professional as soon as possible:    allergic reactions like skin rash, itching or hives, swelling of the face, lips, or tongue  feeling faint or lightheaded, falls  loss of bladder control  seizures  signs and  symptoms of a dangerous change in heartbeat or heart rhythm like chest pain; dizziness; fast or irregular heartbeat; palpitations; feeling faint or lightheaded, falls; breathing problems  signs and symptoms of infection like fever or chills; cough; sore throat; pain or trouble passing urine  signs and symptoms of liver injury like dark yellow or brown urine; general ill feeling or flu-like symptoms; light-colored stools; loss of appetite; nausea; right upper belly pain; unusually weak or tired; yellowing of the eyes or skin  slow heartbeat or palpitations  unusual bleeding or bruising  vomiting Side effects that usually do not require medical attention (report to your doctor or health care professional if they continue or are bothersome):  diarrhea, especially when starting treatment  headache  loss of appetite  muscle cramps  nausea  stomach upset This list may not describe all possible side effects. Call your doctor for medical advice about side effects. You may report side effects to FDA at 1-800-FDA-1088. Where should I keep my medicine? Keep out of reach of children. Store at room temperature between 15 and 30 degrees C (59 and 86 degrees F). Throw away any unused medicine after the expiration date. NOTE: This sheet is a summary. It may not cover all possible information. If you have questions about this medicine, talk to your doctor, pharmacist, or health care provider.  2020 Elsevier/Gold Standard (2018-01-29 10:33:41)  

## 2019-08-22 NOTE — Progress Notes (Signed)
[name]   Provider:  Dr Lucia Gaskins Referring Provider: Catha Gosselin, MD Primary Care Physician:  [name], MD  CC:  Memory issues  Here with his wife who provides most information. . 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 08/15/2017. Patient here for follow up. He also has RLS and was evaluated with sleep study in 2017 without any significant parasomnia or sleep apnea. They finished the Healthy Brain Study at [facility] feels stable. Wife says memory is worse, He has t be reminded to complete projects. Still has a good sense of humor. Still does his work, working in the yard, he takes his medication, they have been researching nutrition, we discussed the MIND diet, eating well, no falls, no depression but he does go up to people and makes comments to people like "is your name precious or beautiful?" but he laughs about it today. No depression or anxiety. We spoke about [name] new medication. Completing the study at [facility] in the next month. They decline [name] and [name]. I recommend [name].   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 08/15/2017. Patient here for follow up. He also has RLS and was evaluated with sleep study in 2017 without any significant parasomnia or sleep apnea. They finished the Healthy Brain Study at [facility] and he is in a research study he has to wear a patch. He is doing word searches on his phone, he works out in the yard, staying social as much as they can in [employer]. He has had his Vitamin D checked and is normal, discussed keeping Vitamin D normal as it plays an important role in disease processes.  He recently had memory testing with [facility] and do not feel the need to repeat his formal neurocog testing. No swallowing problems, no  physical issues, doing well physically and mentally.   Interval History 05/17/2017: Patient is here for follow-up of dementia. MRI of the brain showed severe atrophy of the mesial and anterior temporal lobes bilaterally consistent with an Alzheimer's type pathology. 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 Anne Hahn. 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 Discussed Trail[employer] with patient and wife extensively.he is in a study at [facility].   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 male here as a referral from Dr. Clarene Duke for memory issues.PMHx of glucose intolerance, migraines. He is here with wife who provides information, It is becoming more difficult to remember people's names, directions, conversations. Wife provides information. He says things that are not necessarily true for example as he will not say the right city they went to, word retrieval difficulties. Symptoms started within the last year or two and getting worse. No changes in personality, no behavioral changes, some jokes are more inappropriate than others but this is not necessary anything new. No delusions or hallucinations. He says sometimes when people say things he zones out if it isn't interesting him and maybe that is why he appears to forget things and repeat things they just spoke about. No history of demenita in the family. Denies any episodes of altered mental status. He makes wrong turns a lot. He needs GPS for everything even  if he went to it before. Long-term memory is fine. Wife has always managed the finances. 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 No accidents in the home. He enjoys a few glasses of wine 3-4 times a week. Wife states he has a chin tremor but patient states this is voluntary. No other focal neurologic complaints.  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 Guy Payne, 69 year old male returns for follow-up with history of memory loss with symptoms starting about 2 years ago and worsening. He has more difficulty remembering people's names directions in conversations. He needs GPS for driving. He is retired. 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  Wife also reports today that he has movements of his extremities when he is asleep at night. He is kicking her and she can't stay asleep.  She is not very specific as to how often this occurs she thinks maybe once or twice a week. He also has abnormal movements which sound like myoclonus.  He returns for reevaluation   HPI:  Guy Payne is a 69 y.o. male here as a referral from Dr. Clarene Duke for memory issues.PMHx of glucose intolerance, migraines.  He is here with wife who provides information, It is becoming more difficult to remember people's names, directions, conversations. Wife provides information. He says things that are not necessarily true for example as he will not say the right city they went to, word retrieval difficulties. Symptoms started within the last year or two and getting worse. No changes in personality, no behavioral changes, some jokes are more inappropriate than others but this is not necessary anything new. No delusions or hallucinations. He says sometimes when people say things he zones out if it isn't interesting him and maybe that is why he appears to forget things and repeat things they just spoke about. No history of demenita in the family. Denies any episodes of altered mental status. He makes wrong turns a lot. He needs GPS for everything even if he went to it before. Long-term memory is fine. Wife has always managed the  finances. 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 No accidents in the home. He enjoys a few glasses of wine 3-4 times a week. Wife states he has a chin tremor but patient states this is voluntary. No other focal neurologic complaints.   Reviewed notes, labs and imaging from outside physicians, which showed: Reviewed primary care notes. Vitamin B12 and thyroid was checked per primary care notes. Primary care recommended continuing regular exercise which can help memory as well as keeping in mind active with reading, crossword puzzles etc.    Review of Systems: Patient complains of symptoms per HPI as well as the following symptoms: memory loss. Pertinent negatives and positives per HPI. All others negative    Social History   Socioeconomic History  . Marital status: Married    Spouse name: Not on file  . Number of children: 1  . Years of education: 16+  . Highest education level: Not on file  Occupational History  . Occupation: Retired   Tobacco Use  . Smoking status: Never Smoker  . Smokeless tobacco: Never Used  Vaping Use  .  Vaping Use: Never used  Substance and Sexual Activity  . Alcohol use: Yes    Alcohol/week: 0.0 standard drinks    Comment: 2 days per week. No more than 2 glasses  . Drug use: No  . Sexual activity: Not on file  Other Topics Concern  . Not on file  Social History Narrative   Lives at home with wife   Caffeine use: Drinks 2 cups coffee in morning   Drinks tea- 2 glasses very occasionally    Social Determinants of Health   Financial Resource Strain:   . Difficulty of Paying Living Expenses:   Food Insecurity:   . Worried About Programme researcher, broadcasting/film/videounning Out of Food in the Last Year:   . Baristaan Out of Food in the Last Year:   Transportation Needs:   . Freight forwarderLack of Transportation (Medical):   Marland Kitchen. Lack of Transportation (Non-Medical):   Physical Activity:   . Days of Exercise per Week:   . Minutes of Exercise per Session:    Stress:   . Feeling of Stress :   Social Connections:   . Frequency of Communication with Friends and Family:   . Frequency of Social Gatherings with Friends and Family:   . Attends Religious Services:   . Active Member of Clubs or Organizations:   . Attends BankerClub or Organization Meetings:   Marland Kitchen. Marital Status:   Intimate Partner Violence:   . Fear of Current or Ex-Partner:   . Emotionally Abused:   Marland Kitchen. Physically Abused:   . Sexually Abused:     Family History  Problem Relation Age of Onset  . Dementia Neg Hx     Past Medical History:  Diagnosis Date  . MCI (mild cognitive impairment)   . Memory loss   . Prostate disorder    blockage  . Restless leg     Past Surgical History:  Procedure Laterality Date  . genital surgery     was not specific  . NO PAST SURGERIES    . prostate repair for blockage      Current Outpatient Medications  Medication Sig Dispense Refill  . Ascorbic Acid (VITAMIN C PO) Take by mouth daily.    . B Complex Vitamins (B COMPLEX PO) Take by mouth daily.    . Calcium-Magnesium-Zinc (CAL-MAG-ZINC PO) Take by mouth daily.    . Cholecalciferol (VITAMIN D3 PO) Take by mouth daily.    . Coenzyme Q10 (CO Q 10 PO) Take by mouth daily.    . Lactobacillus (ACIDOPHILUS PO) Take by mouth daily.    . Omega-3 Fatty Acids (FISH OIL PO) Take by mouth daily.    . Sulfacetamide Sodium-Sulfur 10-2 % CREA APPLY TO FACE AT BEDTIME  4  . UNABLE TO FIND daily. Med Name: MCT powder    . VITAMIN E PO Take by mouth daily.    Marland Kitchen. donepezil ([name]) 10 MG tablet Take 1 tablet (10 mg total) by mouth at bedtime. 30 tablet 3   No current facility-administered medications for this visit.    Allergies as of 08/22/2019  . (No Known Allergies)    Vitals: BP (!) 103/58 (BP Location: Right Arm, Patient Position: Sitting)   Pulse 64   Ht 5\' 8"  (1.727 m)   Wt 150 lb (68 kg)   BMI 22.81 kg/m  Last Weight:  Wt Readings from Last 1 Encounters:  08/22/19 150 lb (68 kg)    Last Height:   Ht Readings from Last 1 Encounters:  08/22/19 5\' 8"  (1.727 m)  Physical exam: Exam: Gen: NAD                 CV: RRR, no MRG. No Carotid Bruits. No peripheral edema, warm, nontender Eyes: Conjunctivae clear without exudates or hemorrhage  Neuro: Detailed Neurologic Exam  Speech:    Speech is normal; fluent and spontaneous with normal comprehension.  Cognition:  MMSE - Mini Mental State Exam 08/22/2019 05/17/2017 05/17/2016  Orientation to time 4 5 5   Orientation to Place 3 4 4   Registration 3 3 3   Attention/ Calculation 4 5 5   Recall 3 3 3   Language- name 2 objects 2 2 2   Language- repeat 0 1 1  Language- follow 3 step command 3 3 3   Language- read & follow direction 1 1 1   Write a sentence 1 1 1   Copy design 0 1 1  Total score 24 29 29    Montreal Cognitive Assessment  11/13/2014  Visuospatial/ Executive (0/5) 5  Naming (0/3) 1  Attention: Read list of digits (0/2) 1  Attention: Read list of letters (0/1) 1  Attention: Serial 7 subtraction starting at 100 (0/3) 3  Language: Repeat phrase (0/2) 2  Language : Fluency (0/1) 1  Abstraction (0/2) 1  Delayed Recall (0/5) 1  Orientation (0/6) 5  Total 21  Adjusted Score (based on education) 21   Cranial Nerves:    The pupils are equal, round, and reactive to light. Visual fields are full to finger confrontation. Extraocular movements are intact. Trigeminal sensation is intact and the muscles of mastication are normal. The face is symmetric. The palate elevates in the midline. Hearing intact. Voice is normal. Shoulder shrug is normal. The tongue has normal motion without fasciculations.   Gait:  normal native gait  Motor Observation:    No asymmetry, no atrophy, and no involuntary movements noted. Tone:    Normal muscle tone.    Posture:    Posture is normal. normal erect    Strength:    Strength is V/V in the upper and lower limbs.      Sensation: intact to LT     Reflex Exam:  DTR's:    Deep  tendon reflexes in the upper and lower extremities are symmetrical bilaterally.   Toes:    The toes are equivocal bilaterally.   Clonus:    Clonus is absent.      Assessment/Plan:  This is a very lovely 69 year old male who is here for several years of progressive memory loss with his wife. MMSE 29/20 in 2019, MoCA 21/30 in 2016, today MMSE is 24/30. Exam does show cognitive impairment more advanced than for age and advanced mesial temporal atrophy. He is very pleasant and does not appear to have any behavioral disturbances. 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 Limit alcohol to maximum 1-2 drinks a day per guidelines but I suggest less daily.   Discussed new clinical trials with patient and wife extensively today and had research team come and discuss with tm=em as well, they have participated in several studies at [facility]. 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 08/15/2017. Patient here for follow up.   Wife always present, provides most information. Patient is very pleasant and funny, no mood disorders or behavioral issues.   - Continue to follow B12 and Vitamin D with pcp -A decline [name] in the past side effects we stopped it, however today I do recommend initiating  donepezil again -I discussed her new clinical trial with patient and wife and had the research team come in and talk to them as well -I suggested repeat neurocognitive testing for progression, they declined at this time -I gave patient's wife a book entitled The XX Brain written by the head of women's health initiative Weil Cornell in association with Karin Lieu, this book is more focused on women and dementia however most of the bulk also applies to men with Alzheimer's as far as prevention and treatment. -I discussed with the "Mind Diet", developed at [facility] at has evidence of can reduce Alzheimer's by 30%  and I gave her documentation to review. - genetic testing can be expensive, 23 and me offers epoe4 genetic testing if they are interested at a reasonable price, they do not appear to be interested at this time -Again, discussed memory compensation strategies, staying social and active  No orders of the defined types were placed in this encounter.  Meds ordered this encounter  Medications  . donepezil ([name]) 10 MG tablet    Sig: Take 1 tablet (10 mg total) by mouth at bedtime.    Dispense:  30 tablet    Refill:  3     Naomie Dean, MD  [facility] [address] [place], [place] [address]  Phone [phone] Fax [phone]  I spent 45 minutes of face-to-face and non-face-to-face time with patient on the  1. 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  This included previsit chart review, lab review, study review, order entry, electronic health record documentation, patient education on the different diagnostic and therapeutic options, counseling and coordination of care, risks and benefits of management, compliance, or risk factor reduction

## 2019-08-26 ENCOUNTER — Encounter: Payer: Self-pay | Admitting: Neurology

## 2019-09-21 DIAGNOSIS — S0101XA Laceration without foreign body of scalp, initial encounter: Secondary | ICD-10-CM | POA: Diagnosis not present

## 2019-09-21 DIAGNOSIS — S060X0A Concussion without loss of consciousness, initial encounter: Secondary | ICD-10-CM | POA: Diagnosis not present

## 2019-09-21 DIAGNOSIS — S0990XA Unspecified injury of head, initial encounter: Secondary | ICD-10-CM | POA: Diagnosis not present

## 2019-09-21 DIAGNOSIS — F0390 Unspecified dementia without behavioral disturbance: Secondary | ICD-10-CM | POA: Diagnosis not present

## 2019-09-21 DIAGNOSIS — S098XXA Other specified injuries of head, initial encounter: Secondary | ICD-10-CM | POA: Diagnosis not present

## 2019-10-04 DIAGNOSIS — G3184 Mild cognitive impairment, so stated: Secondary | ICD-10-CM | POA: Diagnosis not present

## 2019-10-04 DIAGNOSIS — Z8249 Family history of ischemic heart disease and other diseases of the circulatory system: Secondary | ICD-10-CM | POA: Diagnosis not present

## 2019-10-04 DIAGNOSIS — L719 Rosacea, unspecified: Secondary | ICD-10-CM | POA: Diagnosis not present

## 2019-10-04 DIAGNOSIS — Z833 Family history of diabetes mellitus: Secondary | ICD-10-CM | POA: Diagnosis not present

## 2019-10-04 DIAGNOSIS — Z803 Family history of malignant neoplasm of breast: Secondary | ICD-10-CM | POA: Diagnosis not present

## 2019-11-14 ENCOUNTER — Other Ambulatory Visit: Payer: Self-pay | Admitting: Neurology

## 2019-11-14 DIAGNOSIS — R471 Dysarthria and anarthria: Secondary | ICD-10-CM | POA: Diagnosis not present

## 2019-11-18 ENCOUNTER — Telehealth: Payer: Self-pay | Admitting: Neurology

## 2019-11-18 NOTE — Telephone Encounter (Signed)
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 It is progressive, we need a speech eval and swallow test. We also need a follow up with me. Physician did not see any fasciculations or weakness. He needs a follow up with [place]. Please call wife and if she agrees order the testing and schedule appoitment thanks

## 2019-11-18 NOTE — Telephone Encounter (Signed)
I spoke with wife [name] (on [place]) and scheduled pt for this Thursday 9/30 @ 11:30 AM arrival 15-30 minutes early. She verbalized appreciation for the call.

## 2019-11-20 NOTE — Progress Notes (Addendum)
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 Dimas Payne in June 2019.  I just saw them recently in July of this year however I was called by physician at [name] for new concerning symptoms including dysarthria that is progressive.  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, also restless leg, last MRI of the brain in 2019 showing severe atrophy of the temporal lobes, also sleep study showing periodic leg movements and patient was treated with Mirapex, new clinical trial became available and patient was offered genetic testing but declined, patient symptoms mostly involved memory as well as word finding difficulties, no behavioral changes, patient wife noticed that patient had some difficulty forming words, or even slight slurring of the speech, intermittently for the last 3 months, patient also fell slipping in the bathroom at the end of July of this year and struck his head and had some loss of consciousness and went to the emergency room where CT the scan head showed no acute problems, he denied headache or tinnitus or vertigo nausea vomiting and no vision changes, they noted dysarthric speech intermittently.  This past summer someone mentioned he was slurring, May-ish, it was not bad so she didn't really pay attention and now everyone is noticing. Progressively worsening. Has nothing to do with alcohol he quit. He had a tooth pulled with a post May 12th and they are waiting 3 months and the tooth is not in yet but he has had no problems and  they called the dentist who said it should not be causing any problems. He is starting to get liquid feeling in his throat and it looks like yogurt, lots of saliva, he has to swallow constantly when he is talking. On the 31syof July he had an MRI but worsening since then. No other physical symptoms.    July 2021: Here with his wife who provides most information. . 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 Dimas Payne 08/15/2017. Patient here for follow up. He also has RLS and was evaluated with sleep study in 2017 without any significant parasomnia or sleep apnea. They finished the Healthy Brain Study at [name]. He feels stable. Wife says memory is worse, He has t be reminded to complete projects. Still has a good sense of humor. Still does his work, working in the yard, he takes his medication, they have been researching nutrition, we discussed the MIND diet, eating well, no falls, no depression but he does go up to people and makes comments to people like "is your name precious or beautiful?" but he laughs about it today. No depression or anxiety. We spoke about [name] new medication. Completing the study at [name] in the next month. They decline [name] and [name]. I recommend [name].   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 Dimas Payne 08/15/2017. Patient here for follow up. He also has RLS and was evaluated with sleep study in 2017 without any significant parasomnia or sleep apnea.  They [facility] Healthy [name] and he is in a research study he has to wear a patch. He is doing word searches on his phone, he works out in the yard, staying social as much as they can in [place] [employer]. He has had his Vitamin D checked and is normal, discussed keeping Vitamin D normal as it plays an important role in disease  processes.  [name] and do not feel the need to repeat his formal neurocog testing. No swallowing problems, no physical issues, doing well physically and mentally.   Interval History 05/17/2017: Patient is here for follow-up of dementia. MRI of the brain showed severe atrophy of the mesial and anterior temporal lobes bilaterally consistent with an Alzheimer's type pathology. 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 Anne Payne. 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 Discussed [employer] with patient and wife extensively.[facility] is [name].   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 male here as a referral from Dr. Clarene Payne for memory issues.PMHx of glucose intolerance, migraines. He is here with wife who provides information, It is becoming more difficult to remember people's names, directions, conversations. Wife provides information. He says things that are not necessarily true for example as he will not say the right city they went to, word retrieval difficulties. Symptoms started within the last year or two and getting worse. No changes in personality, no behavioral changes, some jokes are more inappropriate than others but this is not necessary anything new. No delusions or hallucinations. He says sometimes when people say things he zones out if it isn't interesting him and maybe that is why he appears to forget things and repeat things they just spoke about. No history of demenita in the family. Denies any episodes of altered mental status. He makes wrong turns a lot. He needs GPS for everything even if he went to it before. Long-term memory is fine. Wife has always managed the finances. 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 No accidents in the home. He enjoys  a few glasses of wine 3-4 times a week. Wife states he has a chin tremor but patient states this is voluntary. No other focal neurologic complaints. 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 Mr.Dorwart, 69 year old male returns for follow-up with history of memory loss with symptoms starting about 2 years ago and worsening. He has more difficulty remembering people's names directions in conversations. He needs GPS for driving. He is retired. 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  Wife also reports today that he has movements of his extremities when he is asleep at night. He is kicking her and she can't stay asleep.  She is not very specific as to how often this occurs she thinks maybe once or twice a week. He also has abnormal movements which sound like myoclonus.  He returns for reevaluation   HPI:  Guy Payne is a 69 y.o. male here as a referral from Dr. Clarene Payne for memory issues.PMHx  of glucose intolerance, migraines.  He is here with wife who provides information, It is becoming more difficult to remember people's names, directions, conversations. Wife provides information. He says things that are not necessarily true for example as he will not say the right city they went to, word retrieval difficulties. Symptoms started within the last year or two and getting worse. No changes in personality, no behavioral changes, some jokes are more inappropriate than others but this is not necessary anything new. No delusions or hallucinations. He says sometimes when people say things he zones out if it isn't interesting him and maybe that is why he appears to forget things and repeat things they just spoke about. No history of demenita in the family. Denies any episodes of altered  mental status. He makes wrong turns a lot. He needs GPS for everything even if he went to it before. Long-term memory is fine. Wife has always managed the finances. 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 No accidents in the home. He enjoys a few glasses of wine 3-4 times a week. Wife states he has a chin tremor but patient states this is voluntary. No other focal neurologic complaints.   Reviewed notes, labs and imaging from outside physicians, which showed: Reviewed primary care notes. Vitamin B12 and thyroid was checked per primary care notes. Primary care recommended continuing regular exercise which can help memory as well as keeping in mind active with reading, crossword puzzles etc.    Review of Systems: Patient complains of symptoms per HPI as well as the following symptoms: memory loss. Pertinent negatives and positives per HPI. All others negative    Social History   Socioeconomic History  . Marital status: Married    Spouse name: Not on file  . Number of children: 1  . Years of education: 16+  . Highest education level: Not on file  Occupational History  . Occupation: Retired   Tobacco Use  . Smoking status: Never Smoker  . Smokeless tobacco: Never Used  Vaping Use  . Vaping Use: Never used  Substance and Sexual Activity  . Alcohol use: Not Currently    Alcohol/week: 0.0 standard drinks    Comment: 2 days per week. No more than 2 glasses  . Drug use: No  . Sexual activity: Not on file  Other Topics Concern  . Not on file  Social History Narrative   Lives at home with wife   Caffeine use: Drinks 2 cups coffee in morning   Drinks tea- 2 glasses very occasionally    Social Determinants of Health   Financial Resource Strain:   . Difficulty of Paying Living Expenses: Not on file  Food Insecurity:   . Worried About Programme researcher, broadcasting/film/video in the Last Year: Not on file  . Ran Out of Food in the Last Year: Not on file   Transportation Needs:   . Lack of Transportation (Medical): Not on file  . Lack of Transportation (Non-Medical): Not on file  Physical Activity:   . Days of Exercise per Week: Not on file  . Minutes of Exercise per Session: Not on file  Stress:   . Feeling of Stress : Not on file  Social Connections:   . Frequency of Communication with Friends and Family: Not on file  . Frequency of Social Gatherings with Friends and Family: Not on file  . Attends Religious Services: Not on file  . Active Member  of Clubs or Organizations: Not on file  . Attends Banker Meetings: Not on file  . Marital Status: Not on file  Intimate Partner Violence:   . Fear of Current or Ex-Partner: Not on file  . Emotionally Abused: Not on file  . Physically Abused: Not on file  . Sexually Abused: Not on file    Family History  Problem Relation Age of Onset  . Dementia Neg Hx     Past Medical History:  Diagnosis Date  . Fall 2021  . MCI (mild cognitive impairment)   . Memory loss   . Prostate disorder    blockage  . Restless leg     Past Surgical History:  Procedure Laterality Date  . genital surgery     was not specific  . NO PAST SURGERIES    . prostate repair for blockage      Current Outpatient Medications  Medication Sig Dispense Refill  . Ascorbic Acid (VITAMIN C PO) Take by mouth daily.    . B Complex Vitamins (B COMPLEX PO) Take by mouth daily.    . Calcium-Magnesium-Zinc (CAL-MAG-ZINC PO) Take by mouth daily.    . Cholecalciferol (VITAMIN D3 PO) Take by mouth daily.    . Coenzyme Q10 (CO Q 10 PO) Take by mouth daily.    . Cyanocobalamin (VITAMIN B-12 PO) Take by mouth.    . donepezil ([name]) 10 MG tablet TAKE 1 TABLET BY MOUTH EVERYDAY AT BEDTIME 90 tablet 3  . Omega-3 Fatty Acids (FISH OIL PO) Take by mouth daily.    . Sulfacetamide Sodium-Sulfur 10-2 % CREA APPLY TO FACE AT BEDTIME  4  . UNABLE TO FIND daily. Med Name: MCT powder    . VITAMIN E PO Take by mouth  daily.     No current facility-administered medications for this visit.    Allergies as of 11/21/2019  . (No Known Allergies)    Vitals: BP 119/68 (BP Location: Left Arm, Patient Position: Sitting)   Pulse (!) 49   Ht  (1.727 m)   Wt 156 lb (70.8 kg)   BMI 23.72 kg/m  Last Weight:  Wt Readings from Last 1 Encounters:  11/21/19 156 lb (70.8 kg)   Last Height:   Ht Readings from Last 1 Encounters:  11/21/19  (1.727 m)   Physical exam: Exam: Gen: NAD                 CV: RRR, no MRG. No Carotid Bruits. No peripheral edema, warm, nontender Eyes: Conjunctivae clear without exudates or hemorrhage  Neuro: Detailed Neurologic Exam  Speech:    Speech is normal; fluent and spontaneous with normal comprehension.  Cognition:  MMSE - Mini Mental State Exam 08/22/2019 05/17/2017 05/17/2016  Orientation to time Orientation to Place Registration Attention/ Calculation Recall Language- name 2 objects Language- repeat 0 1 1  Language- follow 3 step command Language- read & follow direction Write a sentence Copy design 0 1 1  Total score Montreal Cognitive Assessment  11/13/2014  Visuospatial/ Executive (0/5) 5  Naming (0/3) 1  Attention: Read list of digits (0/2) 1  Attention: Read list of letters (0/1) 1  Attention: Serial 7 subtraction starting at 100 (0/3) 3  Language: Repeat phrase (0/2)  2  Language : Fluency (0/1) 1  Abstraction (0/2) 1  Delayed Recall (0/5) 1  Orientation (0/6) 5  Total 21  Adjusted Score (based on education) 21   Cranial Nerves:    The pupils are equal, round, and reactive to light. Visual fields are full to finger confrontation. Extraocular movements are intact. Trigeminal sensation is intact and the muscles of mastication are normal. The face is symmetric. The palate elevates in the midline. Hearing intact. Voice is normal. Shoulder shrug is normal. The tongue has  decreased motion without fasciculations, possibly mild atrophy. Can blow out cheeks.   Gait:  normal native gait  Motor Observation:    No asymmetry, no atrophy, and no involuntary movements noted. Tone:    Normal muscle tone.    Posture:    Posture is normal. normal erect    Strength:    Strength is V/V in the upper and lower limbs.      Sensation: intact to LT     Reflex Exam:  DTR's:    Deep tendon reflexes in the upper and lower extremities are symmetrical bilaterally.   Toes:    The toes are equivocal bilaterally.   Clonus:    Clonus is absent.      Assessment/Plan:  This is a very lovely 69 year old male who is here for several years of progressive memory loss with his wife. MMSE 29/20 in 2019, MoCA 21/30 in 2016, today MMSE is 24/30. Exam does show cognitive impairment more advanced than for age and advanced mesial temporal atrophy. He is very pleasant and does not appear to have any behavioral disturbances. 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 Limit alcohol to maximum 1-2 drinks a day per guidelines but I suggest less daily.   Discussed new clinical trials with patient and wife extensively today and had research team come and discuss with tm=em as well, they have participated in several studies at [facility]. 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 Dimas Payne 08/15/2017. Patient here for follow up.   Wife always present, provides most information. Patient is very pleasant and funny, no mood disorders or behavioral issues.   - Swallow evaluation - MRI of the brain w/wo and MRI neck contrast with stroke protocol to eva;luate for stroke, mass, malignancy or other contributing to his dysarthria and dysphagia with inability to control saliva - Primary care - FDG PEt Scan : diagnosed with likely Alzheimer's but need to differentiate between Alz vs FTD due to possibility of FTD  variant ALS(bulbar ALS) given his dementia and other symptoms - emg/ncs if above  -  Orders Placed This Encounter  Procedures  . DG SWALLOW FUNC OP MEDICARE SPEECH PATH  . NM PET Metabolic Brain  . MR BRAIN W WO CONTRAST    PRIOR  - Continue to follow B12 and Vitamin D with pcp -A decline [name] in the past side effects we stopped it, however today I do recommend initiating donepezil again -I discussed her new clinical trial with patient and wife and had the research team come in and talk to them as well -I suggested repeat neurocognitive testing for progression, they declined at this time -I gave patient's wife a book entitled The XX Brain written by the head of women's health initiative Weil Cornell in association with Karin Lieu, this book is more focused on women and dementia however most of the bulk also applies to men with Alzheimer's  as far as prevention and treatment. -I discussed with the "Mind Diet", developed at [facility] at has evidence of can reduce Alzheimer's by 30% and I gave her documentation to review. - genetic testing can be expensive, 23 and me offers epoe4 genetic testing if they are interested at a reasonable price, they do not appear to be interested at this time -Again, discussed memory compensation strategies, staying social and active - MRI soft tissues neck to assess for any head/neck cancers  Orders Placed This Encounter  Procedures  . DG SWALLOW FUNC OP MEDICARE SPEECH PATH  . NM PET Metabolic Brain  . MR BRAIN W WO CONTRAST   No orders of the defined types were placed in this encounter.    Naomie Dean, MD  [facility] [address] [place], [place] [address]  Phone [phone] Fax [phone]  I spent over 40 minutes of face-to-face and non-face-to-face time with patient on the  1. Dysarthria   2. Dysphagia, unspecified type   3. Dementia with behavioral disturbance, unspecified dementia type  (HCC)   4. Saliva increased   5. Alzheimer's disease, unspecified (CODE) (HCC)    diagnosis.  This included previsit chart review, lab review, study review, order entry, electronic health record documentation, patient education on the different diagnostic and therapeutic options, counseling and coordination of care, risks and benefits of management, compliance, or risk factor reduction

## 2019-11-21 ENCOUNTER — Telehealth: Payer: Self-pay | Admitting: Neurology

## 2019-11-21 ENCOUNTER — Ambulatory Visit (INDEPENDENT_AMBULATORY_CARE_PROVIDER_SITE_OTHER): Payer: Medicare PPO | Admitting: Neurology

## 2019-11-21 ENCOUNTER — Encounter: Payer: Self-pay | Admitting: Neurology

## 2019-11-21 VITALS — BP 119/68 | HR 49 | Ht 68.0 in | Wt 156.0 lb

## 2019-11-21 DIAGNOSIS — F0391 Unspecified dementia with behavioral disturbance: Secondary | ICD-10-CM

## 2019-11-21 DIAGNOSIS — F0280 Dementia in other diseases classified elsewhere without behavioral disturbance: Secondary | ICD-10-CM

## 2019-11-21 DIAGNOSIS — G309 Alzheimer's disease, unspecified: Secondary | ICD-10-CM | POA: Diagnosis not present

## 2019-11-21 DIAGNOSIS — R1310 Dysphagia, unspecified: Secondary | ICD-10-CM | POA: Diagnosis not present

## 2019-11-21 DIAGNOSIS — G3109 Other frontotemporal dementia: Secondary | ICD-10-CM | POA: Diagnosis not present

## 2019-11-21 DIAGNOSIS — R471 Dysarthria and anarthria: Secondary | ICD-10-CM

## 2019-11-21 DIAGNOSIS — K117 Disturbances of salivary secretion: Secondary | ICD-10-CM

## 2019-11-21 NOTE — Telephone Encounter (Signed)
Guy Payne: [id number] (exp. 11/21/19 to 12/21/19) order sent to GI. They will reach out to the patient to schedule.

## 2019-11-21 NOTE — Patient Instructions (Addendum)
MRI of the Bran and MRI soft tissue of the neck Speech and swallow FDG PET SCan: good way to differentiate between Alzheimer's and a frontotemporal dementia  Pending above results may consider EMG/NCS   Electromyoneurogram Electromyoneurogram is a test to check how well your muscles and nerves are working. This procedure includes the combined use of electromyogram (EMG) and nerve conduction study (NCS). EMG is used to look for muscular disorders. NCS, which is also called electroneurogram, measures how well your nerves are controlling your muscles. The procedures are usually done together to check if your muscles and nerves are healthy. If the results of the tests are abnormal, this may indicate disease or injury, such as a neuromuscular disease or peripheral nerve damage. Tell a health care provider about:  Any allergies you have.  All medicines you are taking, including vitamins, herbs, eye drops, creams, and over-the-counter medicines.  Any problems you or family members have had with anesthetic medicines.  Any blood disorders you have.  Any surgeries you have had.  Any medical conditions you have.  If you have a pacemaker.  Whether you are pregnant or may be pregnant. What are the risks? Generally, this is a safe procedure. However, problems may occur, including:  Infection where the electrodes were inserted.  Bleeding. What happens before the procedure? Medicines Ask your health care provider about:  Changing or stopping your regular medicines. This is especially important if you are taking diabetes medicines or blood thinners.  Taking medicines such as aspirin and ibuprofen. These medicines can thin your blood. Do not take these medicines unless your health care provider tells you to take them.  Taking over-the-counter medicines, vitamins, herbs, and supplements. General instructions  Your health care provider may ask you to avoid: ? Beverages that have caffeine,  such as coffee and tea. ? Any products that contain nicotine or tobacco. These products include cigarettes, e-cigarettes, and chewing tobacco. If you need help quitting, ask your health care provider.  Do not use lotions or creams on the same day that you will be having the procedure. What happens during the procedure? For EMG   Your health care provider will ask you to stay in a position so that he or she can access the muscle that will be studied. You may be standing, sitting, or lying down.  You may be given a medicine that numbs the area (local anesthetic).  A very thin needle that has an electrode will be inserted into your muscle.  Another small electrode will be placed on your skin near the muscle.  Your health care provider will ask you to continue to remain still.  The electrodes will send a signal that tells about the electrical activity of your muscles. You may see this on a monitor or hear it in the room.  After your muscles have been studied at rest, your health care provider will ask you to contract or flex your muscles. The electrodes will send a signal that tells about the electrical activity of your muscles.  Your health care provider will remove the electrodes and the electrode needles when the procedure is finished. The procedure may vary among health care providers and hospitals. For NCS   An electrode that records your nerve activity (recording electrode) will be placed on your skin by the muscle that is being studied.  An electrode that is used as a reference (reference electrode) will be placed near the recording electrode.  A paste or gel will be applied to  your skin between the recording electrode and the reference electrode.  Your nerve will be stimulated with a mild shock. Your health care provider will measure how much time it takes for your muscle to react.  Your health care provider will remove the electrodes and the gel when the procedure is  finished. The procedure may vary among health care providers and hospitals. What happens after the procedure?  It is up to you to get the results of your procedure. Ask your health care provider, or the department that is doing the procedure, when your results will be ready.  Your health care provider may: ? Give you medicines for any pain. ? Monitor the insertion sites to make sure that bleeding stops. Summary  Electromyoneurogram is a test to check how well your muscles and nerves are working.  If the results of the tests are abnormal, this may indicate disease or injury.  This is a safe procedure. However, problems may occur, such as bleeding and infection.  Your health care provider will do two tests to complete this procedure. One checks your muscles (EMG) and another checks your nerves (NCS).  It is up to you to get the results of your procedure. Ask your health care provider, or the department that is doing the procedure, when your results will be ready. This information is not intended to replace advice given to you by your health care provider. Make sure you discuss any questions you have with your health care provider. Document Revised: 10/24/2017 Document Reviewed: 10/06/2017 Elsevier Patient Education  2020 [employer].

## 2019-11-25 ENCOUNTER — Telehealth: Payer: Self-pay | Admitting: Neurology

## 2019-11-25 NOTE — Telephone Encounter (Signed)
Patient wife left a voicemail on my phone stating that the patient is suppose to also have a MRI soft tissue of the neck.?

## 2019-11-25 NOTE — Telephone Encounter (Signed)
I want to make sure he has no head and neck cancer, ordered

## 2019-11-25 NOTE — Telephone Encounter (Signed)
Pt's wife, May Ozment called would like all communication sent to my cellphone to schedule MRI.  We are out of town.

## 2019-11-25 NOTE — Addendum Note (Signed)
Addended by: Naomie Dean B on: 11/25/2019 11:12 AM   Modules accepted: Orders

## 2019-11-25 NOTE — Telephone Encounter (Signed)
Pt's wife's number in the primary number on pt's chart. GI has called and LMOM. I spoke with [name] and confirmed she has GI's number. I also put a note on the Pet scan request for her to be called for scheduling per wife's request.

## 2019-11-25 NOTE — Telephone Encounter (Signed)
Noted, the MRI Neck is pending with [employer].

## 2019-11-25 NOTE — Telephone Encounter (Signed)
Francine Graven Berkley Harvey: [id number] (exp. 11/25/19 to 12/25/19) order sent to GI. They will reach out to the patient to schedule.

## 2019-12-03 ENCOUNTER — Other Ambulatory Visit: Payer: Self-pay | Admitting: Neurology

## 2019-12-03 DIAGNOSIS — K117 Disturbances of salivary secretion: Secondary | ICD-10-CM

## 2019-12-03 DIAGNOSIS — R471 Dysarthria and anarthria: Secondary | ICD-10-CM

## 2019-12-03 DIAGNOSIS — R1310 Dysphagia, unspecified: Secondary | ICD-10-CM

## 2019-12-03 NOTE — Addendum Note (Signed)
Addended by: Bertram Savin on: 12/03/2019 03:39 PM   Modules accepted: Orders

## 2019-12-09 ENCOUNTER — Other Ambulatory Visit: Payer: Self-pay

## 2019-12-09 ENCOUNTER — Ambulatory Visit (HOSPITAL_COMMUNITY)
Admission: RE | Admit: 2019-12-09 | Discharge: 2019-12-09 | Disposition: A | Discharge status: Home / Self Care | Payer: Medicare PPO | Source: Ambulatory Visit | Attending: Neurology | Admitting: Neurology

## 2019-12-09 ENCOUNTER — Telehealth: Payer: Self-pay | Admitting: Neurology

## 2019-12-09 DIAGNOSIS — R471 Dysarthria and anarthria: Secondary | ICD-10-CM

## 2019-12-09 DIAGNOSIS — G309 Alzheimer's disease, unspecified: Secondary | ICD-10-CM

## 2019-12-09 DIAGNOSIS — F0391 Unspecified dementia with behavioral disturbance: Secondary | ICD-10-CM | POA: Diagnosis not present

## 2019-12-09 DIAGNOSIS — K117 Disturbances of salivary secretion: Secondary | ICD-10-CM | POA: Diagnosis not present

## 2019-12-09 DIAGNOSIS — R1310 Dysphagia, unspecified: Secondary | ICD-10-CM

## 2019-12-09 LAB — GLUCOSE, CAPILLARY: Glucose-Capillary: 92 mg/dL (ref 70–99)

## 2019-12-09 MED ORDER — FLUDEOXYGLUCOSE F - 18 (FDG) INJECTION
9.9000 | Freq: Once | INTRAVENOUS | Status: AC | PRN
  Administered 2019-12-09: 9.9 via INTRAVENOUS

## 2019-12-09 NOTE — Telephone Encounter (Signed)
[facility] called, Pt scheduled for a Barium swallow test. Concerned if authorization is required. Pt scheduled for test on 12/19/19. Do not have authorization in the system.

## 2019-12-09 NOTE — Telephone Encounter (Signed)
[facility] [facility] ?  [facility] does not usually handle swallow study just prescriptions . Did they leave a telephone number ? Are you sure I t was not prescription  ?

## 2019-12-10 ENCOUNTER — Telehealth: Payer: Self-pay | Admitting: *Deleted

## 2019-12-10 NOTE — Telephone Encounter (Signed)
Yes, entered under contact; [phone]. Reference no: [id number]

## 2019-12-10 NOTE — Telephone Encounter (Signed)
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 Please forward this to the physician he has been seeing at [facility]?) who called me and was concerned for an ALS/Frontotemporal dementia variant. We probably need to refer him to an ALS specialist at [facility] Dr. Alphonzo Dublin. Can you find out who they see at [facility] that called me about his dysarthria and concern for FTD/ALS variant? I can;t find his name.

## 2019-12-10 NOTE — Telephone Encounter (Signed)
Ladona Ridgel from [facility] research called me back. He said Dr Vaughan Basta did see the patient but will not be seeing the pt again due to the fact that the patient has his final appointment with Dr Threasa Beards this Friday. Ladona Ridgel advised that I can fax the results to him and he will get those to both Dr Mayford Knife and Threasa Beards so they can discuss what they would like to do. His fax number is [phone]. I called the pt's wife back per her request and LVM (ok per DPR) advising of the plan to send the results for Ladona Ridgel at the research dept and that he will discuss with the physicians. I let her know I would be in touch as needed to discuss any further steps once I update Dr Lucia Gaskins.

## 2019-12-10 NOTE — Telephone Encounter (Signed)
I faxed the pet scan results to [facility] @ [phone]. Received a receipt of confirmation.

## 2019-12-10 NOTE — Telephone Encounter (Signed)
I called the pt's wife and let her know the pet metabolic brain scan is concerning for FTD. She saw the results on mychart. Pt's wife was not sure who called [place] about his speech problems but we discussed it may have been a Dr Vaughan Basta. I let her know we would like to send the pet scan results to that physician who saw him and she agreed and was appreciative. The pt is to see Dr Einar Gip this Friday for what is supposed to be a final research appt and review of results. Pt's wife, Guy Payne, provided me with a number for Ladona Ridgel who is head of research, [phone]. I called Ladona Ridgel and LVM asking for call back to discuss patient and his provider there. Will need to obtain a fax number.

## 2019-12-10 NOTE — Telephone Encounter (Signed)
[employer] and the member called asking about his insurance. Patient does not need auth for swallow . Marland Kitchen Reference # [id number]

## 2019-12-12 ENCOUNTER — Ambulatory Visit
Admission: RE | Admit: 2019-12-12 | Discharge: 2019-12-12 | Disposition: A | Discharge status: Home / Self Care | Payer: Medicare PPO | Source: Ambulatory Visit | Attending: Neurology | Admitting: Neurology

## 2019-12-12 ENCOUNTER — Other Ambulatory Visit: Payer: Self-pay

## 2019-12-12 DIAGNOSIS — R1310 Dysphagia, unspecified: Secondary | ICD-10-CM

## 2019-12-12 DIAGNOSIS — K117 Disturbances of salivary secretion: Secondary | ICD-10-CM

## 2019-12-12 DIAGNOSIS — R471 Dysarthria and anarthria: Secondary | ICD-10-CM | POA: Diagnosis not present

## 2019-12-12 DIAGNOSIS — F0391 Unspecified dementia with behavioral disturbance: Secondary | ICD-10-CM

## 2019-12-12 DIAGNOSIS — G309 Alzheimer's disease, unspecified: Secondary | ICD-10-CM

## 2019-12-12 MED ORDER — GADOBENATE DIMEGLUMINE 529 MG/ML IV SOLN
14.0000 mL | Freq: Once | INTRAVENOUS | Status: AC | PRN
  Administered 2019-12-12: 14 mL via INTRAVENOUS

## 2019-12-12 NOTE — Telephone Encounter (Signed)
[employer] [place]) [phone]) called, Pt having a Barium swallow test done, asking if procedure code requires a authorization. (based on last telephone note) Informed Joni Reining  Pt does not need authorization for swallow test. Joni Reining said, she would inform Pt's wife.

## 2019-12-13 DIAGNOSIS — F0280 Dementia in other diseases classified elsewhere without behavioral disturbance: Secondary | ICD-10-CM

## 2019-12-13 DIAGNOSIS — K117 Disturbances of salivary secretion: Secondary | ICD-10-CM

## 2019-12-13 DIAGNOSIS — G1220 Motor neuron disease, unspecified: Secondary | ICD-10-CM

## 2019-12-13 DIAGNOSIS — R471 Dysarthria and anarthria: Secondary | ICD-10-CM

## 2019-12-15 NOTE — Telephone Encounter (Signed)
I would like to refer patient to Dr. Alphonzo Dublin at [facility]. If they are OK with this we can place the referral. I want him to be evaluated for possible ALS which I did discuss with his wife at appointment.

## 2019-12-16 ENCOUNTER — Other Ambulatory Visit: Payer: Self-pay | Admitting: Neurology

## 2019-12-16 ENCOUNTER — Telehealth: Payer: Self-pay | Admitting: Neurology

## 2019-12-16 DIAGNOSIS — F0280 Dementia in other diseases classified elsewhere without behavioral disturbance: Secondary | ICD-10-CM

## 2019-12-16 DIAGNOSIS — G3109 Other frontotemporal dementia: Secondary | ICD-10-CM

## 2019-12-16 NOTE — Telephone Encounter (Signed)
See mychart.  

## 2019-12-16 NOTE — Telephone Encounter (Signed)
The MRI soft tissue neck results have not come back yet. I have forwarded the message from pt's wife to Dr Lucia Gaskins re: referral to [facility]. Will see what Dr Lucia Gaskins says.

## 2019-12-16 NOTE — Telephone Encounter (Signed)
Pt's wife called wanting the RN to call her to discuss the results of the MRI of the Neck that the pt had done but the results have not been posted on MyChart. Also she would like to discuss the referral to [facility]. Please advise.

## 2019-12-17 NOTE — Telephone Encounter (Signed)
See mychart. I messaged the pt's wife back.

## 2019-12-18 ENCOUNTER — Telehealth: Payer: Self-pay | Admitting: Neurology

## 2019-12-18 NOTE — Telephone Encounter (Signed)
Patient is scheduled with Dr. Blain Pais 02/11/2020 at 8:00 am Telephone [phone] - Fax [phone] . Patient is also on CX list . Patient will need to bring his MRI disc with him to apt. [facility] will mail patient a map with directions .

## 2019-12-18 NOTE — Telephone Encounter (Signed)
Dr. Lucia Gaskins please advise Dr. Alphonzo Dublin does not have a opening until Nov. 2022 . Is It ok if patient see's Dr. Blain Pais because patient can be seen in December ?

## 2019-12-18 NOTE — Telephone Encounter (Signed)
Guy Payne, I spoke with Dr Lucia Gaskins. Since Dr Alphonzo Dublin is not available patient can see Dr Blain Pais. Thank you!

## 2019-12-19 ENCOUNTER — Ambulatory Visit (HOSPITAL_COMMUNITY)
Admission: RE | Admit: 2019-12-19 | Discharge: 2019-12-19 | Disposition: A | Discharge status: Home / Self Care | Payer: Medicare PPO | Source: Ambulatory Visit | Attending: Neurology | Admitting: Neurology

## 2019-12-19 ENCOUNTER — Other Ambulatory Visit: Payer: Self-pay

## 2019-12-19 DIAGNOSIS — K117 Disturbances of salivary secretion: Secondary | ICD-10-CM | POA: Diagnosis not present

## 2019-12-19 DIAGNOSIS — R1312 Dysphagia, oropharyngeal phase: Secondary | ICD-10-CM | POA: Insufficient documentation

## 2019-12-19 DIAGNOSIS — R1310 Dysphagia, unspecified: Secondary | ICD-10-CM

## 2019-12-19 DIAGNOSIS — R471 Dysarthria and anarthria: Secondary | ICD-10-CM | POA: Diagnosis not present

## 2019-12-19 NOTE — Therapy (Signed)
Modified Barium Swallow Progress Note  Patient Details  Name: Guy Payne MRN: [id number] Date of Birth: 1950/06/06  Today's Date: 12/19/2019  Modified Barium Swallow completed.  Full report located under Chart Review in the Imaging Section.  Brief recommendations include the following:  Clinical Impression Guy Payne exhibits mild dysarthria, however, CN exam appears unremarkable. Guy Payne presents with functional oral and pharyngeal swallow. Piecemeal swallow was noted with larger boluses of puree and solid.   Trace post-swallow residue was noted in the vallecular and pyriform sinuses and lateral channels, which cleared with dry swallow. No penetration or aspiration of any consistency was seen.   Chart review raises concern for frontotemporal dementia. Outpatient speech therapy services may be beneficial to maximize intelligibility and establish routines to facilitate continued independence and safety. This was discussed with Guy Payne, and he verbalized agreement with the recommendation.    Swallow Evaluation Recommendations  SLP Diet Recommendations: Regular solids;Thin liquid   Liquid Administration via: Cup;Straw   Medication Administration: Whole meds with liquid   Supervision: Patient able to self feed   Compensations: Minimize environmental distractions;Slow rate;Small sips/bites   Postural Changes: Seated upright at 90 degrees   Oral Care Recommendations: Oral care BID     Catlin Aycock B. Murvin Natal, [facility] [address], CCC-SLP Speech Language Pathologist Office: [phone] Pager: [phone]   Leigh Aurora 12/19/2019,2:53 PM

## 2019-12-23 ENCOUNTER — Telehealth: Payer: Self-pay | Admitting: Neurology

## 2019-12-23 NOTE — Telephone Encounter (Signed)
Rep. From [employer] called to request auth. for speech therapy.   He stated can contact clinical intake once it's started at [phone].

## 2019-12-24 NOTE — Telephone Encounter (Signed)
[facility] will Take care of this Auth

## 2019-12-24 NOTE — Addendum Note (Signed)
Addended by: Bertram Savin on: 12/24/2019 07:58 AM   Modules accepted: Orders

## 2019-12-26 ENCOUNTER — Ambulatory Visit: Payer: Medicare PPO | Attending: Family Medicine

## 2019-12-26 ENCOUNTER — Other Ambulatory Visit: Payer: Self-pay

## 2019-12-26 DIAGNOSIS — R471 Dysarthria and anarthria: Secondary | ICD-10-CM | POA: Diagnosis not present

## 2019-12-26 DIAGNOSIS — R41841 Cognitive communication deficit: Secondary | ICD-10-CM

## 2019-12-26 NOTE — Patient Instructions (Signed)
° °  Talk with overarticulation in all conversations.  Read out loud to practice this, once a day, in 60-second intervals x8-10 times. FEEL the sounds being generated or formed in your mouth as you talk. Open your mouth as you talk, Over-move the lips and tongue as you talk!  We will work on making this more of a habit in our sessions together.

## 2019-12-26 NOTE — Therapy (Signed)
[facility] [address] [place], [place], [address] Phone: [phone]   Fax:  [phone]  Speech Language Pathology Evaluation  Patient Details  Name: Guy Payne Payne MRN: [id number] Date of Birth: 08/04/1950 Referring Provider (SLP): Guy Payne Dean, MD   Encounter Date: 12/26/2019   End of Session - 12/26/19 1111    Visit Number 1    Number of Visits 17    Date for SLP Re-Evaluation 03/25/20    SLP Start Time 0851    SLP Stop Time  0931    SLP Time Calculation (min) 40 min    Activity Tolerance Patient tolerated treatment well           Past Medical History:  Diagnosis Date  . Fall 2021  . MCI (mild cognitive impairment)   . Memory loss   . Prostate disorder    blockage  . Restless leg     Past Surgical History:  Procedure Laterality Date  . genital surgery     was not specific  . NO PAST SURGERIES    . prostate repair for blockage      There were no vitals filed for this visit.   Subjective Assessment - 12/26/19 0855    Subjective "My friends have asked me, 'Guy Payne Payne have you had too much to drink?'"    Patient is accompained by: Family member   Guy Payne   Currently in Pain? No/denies              SLP Evaluation Guy Payne Payne - 12/26/19 1791      SLP Visit Information   SLP Received On 12/26/19    Referring Provider (SLP) Guy Payne Dean, MD    Onset Date approx 2016    Medical Diagnosis FTD with motor neuron disease      General Information   HPI Pt referred to Dr. Blain Payne at [facility] for ALS       Prior Functional Status   Cognitive/Linguistic Baseline Baseline deficits    Baseline deficit details memory, impulsivity      Cognition   Overall Cognitive Status Impaired/Different from baseline    Area of Impairment Attention    Behaviors Impulsive;Restless      Auditory Comprehension   Overall Auditory Comprehension Appears within functional limits for tasks assessed      Verbal Expression    Overall Verbal Expression Appears within functional limits for tasks assessed      Oral Motor/Sensory Function   Overall Oral Motor/Sensory Function Impaired    Labial ROM Within Functional Limits    Labial Symmetry Within Functional Limits    Labial Strength Reduced    Labial Coordination Reduced    Lingual ROM Within Functional Limits    Lingual Symmetry Abnormal symmetry left    Lingual Coordination --   very sluggish   Facial ROM Within Functional Limits      Motor Speech   Overall Motor Speech Impaired    Articulation Impaired    Level of Impairment Word    Intelligibility Intelligible    Effective Techniques Over-articulate;Slow rate   pt incr'd intelligibility to near-100% with Q and A task                          SLP Education - 12/26/19 1110    Education Details Possible goals, compensatory techniques for intelligibility, if ALS think about voice banking    Person(s) Educated Patient;Spouse    Methods Explanation;Demonstration;Verbal cues  Comprehension Verbalized understanding;Returned demonstration;Verbal cues required;Need further instruction            SLP Short Term Goals - 12/26/19 1118      SLP SHORT TERM GOAL #1   Title pt will perform HEP for dysarthria with rare min A from SLP x2 sesisons    Time 4    Period Weeks    Status New      SLP SHORT TERM GOAL #2   Title pt will demonstrate compensations for dysarthria in 5 mintues simple conversation x2 sessions    Time 4    Period Weeks    Status New      SLP SHORT TERM GOAL #3   Title pt will report 4 attemtps at functional means to decr hypersalivation (lozenge, gum, alarms to swalllow)    Time 4    Period Weeks    Status New            SLP Long Term Goals - 12/26/19 1119      SLP LONG TERM GOAL #1   Title pt will demonstrate 75% use of compensations for dysarthria in 10 mintues simple conversation x3 sessions    Time 8    Period Weeks    Status New      SLP LONG  TERM GOAL #2   Title pt will complete HEP for dysarthria/intelligiblity with rare min A from SLP for overarticulation ("tongue twisters") x3 sessions    Time 8    Period Weeks    Status New      SLP LONG TERM GOAL #3   Title pt will report better speech related QOL score than the score obtained during pt's first 2 sessions    Time 8    Period Weeks    Status New      SLP LONG TERM GOAL #4   Title pt will verbalize a 1-2 sentence summary of what a voice bank is to SLP with modified indpendence    Time 8    Period Weeks    Status New            Plan - 12/26/19 1112    Clinical Impression Statement Guy Payne Payne presents today with 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 He had MBSS 12-19-19 which was grossly WNL. SEe report under "imaging" for details. He and wife both report growing disappointment with his speech clarity (see pt's "S" statement) and the amount of "excess saliva". SLP suggested pt try a lozenge or a half stick of gum to see if this mitigates this or eliminates it. Wife said they will try this. He also is dx'd with frontotemporal dementia and due to this demonstrates decr'd memory and attention skills. Cognitive eval and goals will be completed at a later date if clinicially indicated. 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 At times today, Guy Payne Payne's responses and spontaneous comments were mildly socially inappropriate (overly doting on his wife).    Speech Therapy Frequency 2x / week    Duration 8 weeks   or 17 total sessions   Treatment/Interventions Environmental controls;Compensatory techniques;Functional tasks;Multimodal communcation approach;SLP instruction and feedback;Cueing hierarchy;Patient/family education;Cognitive reorganization;Internal/external aids    Potential to Achieve Goals Fair    Potential Considerations Medical prognosis   (possible) referral to ALS specialist pending December 2021   SLP  Home Exercise Plan provided    Consulted and Agree with Plan of Care Patient;Family member/caregiver    Family Member  Consulted wife           Patient will benefit from skilled therapeutic intervention in order to improve the following deficits and impairments:   Dysarthria and anarthria  Cognitive communication deficit    Problem List Patient Active Problem List   Diagnosis Date Noted  . Periodic limb movements of sleep 05/14/2015  . 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 Glucose intolerance (impaired glucose tolerance) 11/16/2014    Aamirah Salmi ,MS, CCC-SLP  12/26/2019, 11:25 AM  [facility] [address] [place], [place], [address] Phone: [phone]   Fax:  [phone]  Name: Guy Payne Payne MRN: [id number] Date of Birth: May 06, 1950

## 2019-12-30 ENCOUNTER — Other Ambulatory Visit: Payer: Self-pay

## 2019-12-30 ENCOUNTER — Ambulatory Visit: Payer: Medicare PPO

## 2019-12-30 DIAGNOSIS — R471 Dysarthria and anarthria: Secondary | ICD-10-CM

## 2019-12-30 DIAGNOSIS — R41841 Cognitive communication deficit: Secondary | ICD-10-CM

## 2019-12-30 NOTE — Therapy (Signed)
[facility] [address] [place], [place], [address] Phone: [phone]   Fax:  [phone]  Speech Language Pathology Treatment  Patient Details  Name: Guy Payne MRN: [id number] Date of Birth: 12/04/1950 Referring Provider (SLP): Naomie Dean, MD   Encounter Date: 12/30/2019   End of Session - 12/30/19 1725    Visit Number 2    Number of Visits 17    Date for SLP Re-Evaluation 03/25/20    SLP Start Time 1618    SLP Stop Time  1700    SLP Time Calculation (min) 42 min    Activity Tolerance Patient tolerated treatment well           Past Medical History:  Diagnosis Date  . Fall 2021  . MCI (mild cognitive impairment)   . Memory loss   . Prostate disorder    blockage  . Restless leg     Past Surgical History:  Procedure Laterality Date  . genital surgery     was not specific  . NO PAST SURGERIES    . prostate repair for blockage      There were no vitals filed for this visit.   Subjective Assessment - 12/30/19 1628    Subjective Enters with wife today.    Patient is accompained by: Family member   Kriste Basque - wife                ADULT SLP TREATMENT - 12/30/19 1629      General Information   Behavior/Cognition Alert;Cooperative;Pleasant mood      Treatment Provided   Treatment provided Cognitive-Linquistic      Pain Assessment   Pain Assessment No/denies pain      Cognitive-Linquistic Treatment   Treatment focused on Dysarthria    Skilled Treatment SLP facilitated clearer speech by providing pt a HEP with consonant-laden words ("tongue twisters") and then reviewing the first 10 phrases with him, min cues for overarticulated speech rarely necessary. In sturcutred speech tasks at word level pt req'd min A  rarely for open mouth/overarticulation and with sentence responses pt req'd occasional min A for repeats. Pt's responses were functional however, in that they did not negatively  detract from pt's message. Decr'd ability for naming was observed with word task (divergent naming). SLP provided naming tasks for pt to cont to practice at the word level at home. And SLP suggested ways pt/wife can incorporate grandchildren into pt's practice - pt/wife are staying with grandchildren this week.      Assessment / Recommendations / Plan   Plan Continue with current plan of care      Progression Toward Goals   Progression toward goals Progressing toward goals            SLP Education - 12/30/19 1724    Education Details HEP, overarticulation for incr'd intelligibility    Person(s) Educated Patient    Methods Explanation;Demonstration;Handout    Comprehension Verbalized understanding;Returned demonstration;Verbal cues required;Need further instruction            SLP Short Term Goals - 12/30/19 1728      SLP SHORT TERM GOAL #1   Title pt will perform HEP for dysarthria with rare min A from SLP x2 sesisons    Time 4    Period Weeks    Status On-going      SLP SHORT TERM GOAL #2   Title pt will demonstrate compensations for dysarthria in 5 mintues simple conversation x2 sessions  Time 4    Period Weeks    Status On-going      SLP SHORT TERM GOAL #3   Title pt will report 4 attemtps at functional means to decr hypersalivation (lozenge, gum, alarms to swalllow)    Time 4    Period Weeks    Status On-going            SLP Long Term Goals - 12/30/19 1728      SLP LONG TERM GOAL #1   Title pt will demonstrate 75% use of compensations for dysarthria in 10 mintues simple conversation x3 sessions    Time 8    Period Weeks    Status On-going      SLP LONG TERM GOAL #2   Title pt will complete HEP for dysarthria/intelligiblity with rare min A from SLP for overarticulation ("tongue twisters") x3 sessions    Time 8    Period Weeks    Status On-going      SLP LONG TERM GOAL #3   Title pt will report better speech related QOL score than the score obtained  during pt's first 2 sessions    Time 8    Period Weeks    Status On-going      SLP LONG TERM GOAL #4   Title pt will verbalize a 1-2 sentence summary of what a voice bank is to SLP with modified indpendence    Time 8    Period Weeks    Status On-going            Plan - 12/30/19 1725    Clinical Impression Statement Cass Vandermeulen presents today with 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 He and wife both report growing disappointment with his speech clarity and the amount of "excess saliva". He also is dx'd with frontotemporal dementia and due to this demonstrates decr'd memory and attention skills. Cognitive eval and goals will be completed at a later date if clinicially indicated. 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 At times today, Emrys's responses and spontaneous comments were mildly socially inappropriate (overly doting on his wife).Pt will benefit from skilled ST to treat his decr'ing speech intelligbility. If in fact pt's disease process is degenerative speech compensations will be limited to the degree pt can recall to use them or has the motor ability to use them.    Speech Therapy Frequency 2x / week    Duration 8 weeks   or 17 total sessions   Treatment/Interventions Environmental controls;Compensatory techniques;Functional tasks;Multimodal communcation approach;SLP instruction and feedback;Cueing hierarchy;Patient/family education;Cognitive reorganization;Internal/external aids    Potential to Achieve Goals Fair    Potential Considerations Medical prognosis   (possible) referral to ALS specialist pending December 2021   SLP Home Exercise Plan provided    Consulted and Agree with Plan of Care Patient;Family member/caregiver    Family Member Consulted wife           Patient will benefit from skilled therapeutic intervention in order to improve the following deficits and impairments:   Dysarthria and  anarthria  Cognitive communication deficit    Problem List Patient Active Problem List   Diagnosis Date Noted  . Periodic limb movements of sleep 05/14/2015  . 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 Glucose intolerance (impaired glucose tolerance) 11/16/2014    Shyan Scalisi ,MS, CCC-SLP  12/30/2019, 5:28 PM  [facility] [address] [place], [place], [address] Phone: [phone]   Fax:  [phone]   Name: CASHIS RILL MRN: [id number] Date of Birth: 1950/12/15

## 2019-12-30 NOTE — Patient Instructions (Addendum)
  Practice the other handouts about 15 minutes, twice a day as well as the phrases below  Speech Exercises  Repeat 2 times, 2-3 times a day   Say these phrases so you get into the habit of overarticulating ("opening your mouth wide when you talk")  Call the cat "Buttercup" A calendar of [place] Four floors to cover Yellow oil ointment Fellow lovers of felines Catastrophe in [place] Plump plumbers' plums The church's chimes chimed Telling time 'til eleven Five valve levers Keep the gate closed Go see that guy Fat cows give milk [employer] Gophers Fat frogs flip freely [employer] into bed Get that game to [employer] Thick thistles stick together Cinnamon aluminum linoleum Black bugs blood Lovely lemon linament Red leather, yellow leather  Big grocery buggy    Purple baby carriage [facility] Proper copper coffee pot Ripe purple cabbage Three free throws [employer] tackled  [employer] dipped the dessert  [employer] Buckle that [employer] of [place] Shirts shrink, shells shouldn't La[place] [employer]ers Take the tackle box File the flash message Give me five flapjacks Fundamental relatives Dye the pets purple Talking [place] time after time Dark chocolate chunks Political landscape of the kingdom Actuary genius We played yo-yos yesterday

## 2020-01-07 ENCOUNTER — Other Ambulatory Visit: Payer: Self-pay

## 2020-01-07 ENCOUNTER — Ambulatory Visit: Payer: Medicare PPO

## 2020-01-07 DIAGNOSIS — R471 Dysarthria and anarthria: Secondary | ICD-10-CM | POA: Diagnosis not present

## 2020-01-07 DIAGNOSIS — R41841 Cognitive communication deficit: Secondary | ICD-10-CM

## 2020-01-07 NOTE — Patient Instructions (Signed)
  Please complete the assigned speech therapy homework prior to your next session and return it to the speech therapist at your next visit.  

## 2020-01-07 NOTE — Therapy (Signed)
[name] [address] [place], [place], [address] Phone: [phone]   Fax:  [phone]  Speech Language Pathology Treatment  Patient Details  Name: Guy Payne MRN: [id number] Date of Birth: Apr 04, 1950 Referring Provider (SLP): Naomie Dean, MD   Encounter Date: 01/07/2020   End of Session - 01/07/20 1317    Visit Number 3    Number of Visits 17    Date for SLP Re-Evaluation 03/25/20    Authorization Time Period 12-30-19 to 03-04-20    Authorization - Visit Number 2    Authorization - Number of Visits 15    SLP Start Time 1150    SLP Stop Time  1230    SLP Time Calculation (min) 40 min    Activity Tolerance Patient tolerated treatment well           Past Medical History:  Diagnosis Date  . Fall 2021  . MCI (mild cognitive impairment)   . Memory loss   . Prostate disorder    blockage  . Restless leg     Past Surgical History:  Procedure Laterality Date  . genital surgery     was not specific  . NO PAST SURGERIES    . prostate repair for blockage      There were no vitals filed for this visit.   Subjective Assessment - 01/07/20 1159    Subjective "'In fact on the way over here I practiced (the tongue twisters)."    Patient is accompained by: Family member   wife   Currently in Pain? No/denies                 ADULT SLP TREATMENT - 01/07/20 1159      General Information   Behavior/Cognition Alert;Cooperative;Pleasant mood      Treatment Provided   Treatment provided Cognitive-Linquistic      Cognitive-Linquistic Treatment   Treatment focused on Dysarthria    Skilled Treatment Guy Payne reports that his usage of lozenges and gum is somewhat successful in reducing incidence of drooling. Pt recited/read tongue twisters with 95% success maintaing slowed/intelligible speech rate. With similarities and differences pt maintained overarticulation 85% of the time. In mod complex stimuli pt  maintained intelligible, slower rate.  In simple conversation pt was successful with more articulate speech 60% of the time.       Assessment / Recommendations / Plan   Plan Continue with current plan of care      Progression Toward Goals   Progression toward goals Progressing toward goals              SLP Short Term Goals - 01/07/20 1158      SLP SHORT TERM GOAL #1   Title pt will perform HEP for dysarthria with rare min A from SLP x2 sesisons    Time 3    Period Weeks    Status On-going      SLP SHORT TERM GOAL #2   Title pt will demonstrate compensations for dysarthria in 5 mintues simple conversation x2 sessions    Time 3    Period Weeks    Status On-going      SLP SHORT TERM GOAL #3   Title pt will report 4 attemtps at functional means to decr hypersalivation (lozenge, gum, alarms to swalllow)    Time 3    Period Weeks    Status On-going            SLP Long Term Goals - 01/07/20  1158      SLP LONG TERM GOAL #1   Title pt will demonstrate 75% use of compensations for dysarthria in 10 mintues simple conversation x3 sessions    Time 7    Period Weeks    Status On-going      SLP LONG TERM GOAL #2   Title pt will complete HEP for dysarthria/intelligiblity with rare min A from SLP for overarticulation ("tongue twisters") x3 sessions    Time 7    Period Weeks    Status On-going      SLP LONG TERM GOAL #3   Title pt will report better speech related QOL score than the score obtained during pt's first 2 sessions    Baseline wife-72, pt-60 reported 12-30-19 (session #2)    Time 7    Period Weeks    Status On-going      SLP LONG TERM GOAL #4   Title pt will verbalize a 1-2 sentence summary of what a voice bank is to SLP with modified indpendence    Time 7    Period Weeks    Status On-going            Plan - 01/07/20 1318    Clinical Impression Statement Guy Payne presents today with 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 He reports amount of "excess saliva" has decr'd now that he uses gum and lozenges during the day. He also is dx'd with frontotemporal dementia and due to this demonstrates decr'd memory and attention skills. Cognitive eval and goals will be completed at a later date if clinicially indicated. 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 At times today, Guy Payne's responses and spontaneous comments were mildly socially inappropriate (overly doting on his wife).Pt will benefit from skilled ST to treat his decr'ing speech intelligbility. If in fact pt's disease process is degenerative speech compensations will be limited to the degree pt can recall to use them or has the motor ability to use them.    Speech Therapy Frequency 2x / week    Duration 8 weeks   or 17 total sessions   Treatment/Interventions Environmental controls;Compensatory techniques;Functional tasks;Multimodal communcation approach;SLP instruction and feedback;Cueing hierarchy;Patient/family education;Cognitive reorganization;Internal/external aids    Potential to Achieve Goals Fair    Potential Considerations Medical prognosis   (possible) referral to ALS specialist pending December 2021   SLP Home Exercise Plan provided    Consulted and Agree with Plan of Care Patient;Family member/caregiver    Family Member Consulted wife           Patient will benefit from skilled therapeutic intervention in order to improve the following deficits and impairments:   Dysarthria and anarthria  Cognitive communication deficit    Problem List Patient Active Problem List   Diagnosis Date Noted  . Periodic limb movements of sleep 05/14/2015  . 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 Glucose intolerance (impaired glucose tolerance) 11/16/2014    Lanise Mergen ,MS, CCC-SLP  01/07/2020, 1:19 PM  [facility] [address] [place], [place], [address] Phone: [phone]   Fax:  [phone]   Name: Guy Payne MRN: [id number] Date of Birth: 06-30-1950

## 2020-01-20 ENCOUNTER — Other Ambulatory Visit: Payer: Self-pay

## 2020-01-20 ENCOUNTER — Ambulatory Visit: Payer: Medicare PPO

## 2020-01-20 DIAGNOSIS — R471 Dysarthria and anarthria: Secondary | ICD-10-CM

## 2020-01-20 DIAGNOSIS — R41841 Cognitive communication deficit: Secondary | ICD-10-CM | POA: Diagnosis not present

## 2020-01-20 NOTE — Patient Instructions (Addendum)
°    °  Go to "[email or web address]"   - "create account"   Tasks for "attention" or "focus" or "concentration"

## 2020-01-20 NOTE — Therapy (Signed)
[facility] [address] [place], [place], [address] Phone: [phone]   Fax:  [phone]  Speech Language Pathology Treatment  Patient Details  Name: Guy Payne MRN: [id number] Date of Birth: 1950-09-09 Referring Provider (SLP): Naomie Dean, MD   Encounter Date: 01/20/2020   End of Session - 01/20/20 1525    Visit Number 4    Number of Visits 17    Date for SLP Re-Evaluation 03/25/20    Authorization Time Period 12-30-19 to 03-04-20    Authorization - Visit Number 3    Authorization - Number of Visits 15    SLP Start Time 1451   pt 5 minutes late   SLP Stop Time  1530    SLP Time Calculation (min) 39 min    Activity Tolerance Patient tolerated treatment well           Past Medical History:  Diagnosis Date  . Fall 2021  . MCI (mild cognitive impairment)   . Memory loss   . Prostate disorder    blockage  . Restless leg     Past Surgical History:  Procedure Laterality Date  . genital surgery     was not specific  . NO PAST SURGERIES    . prostate repair for blockage      There were no vitals filed for this visit.   Subjective Assessment - 01/20/20 1457    Subjective "My friends don't ask me if I've been drinking."    Patient is accompained by: Family member   Guy Payne   Currently in Pain? No/denies                 ADULT SLP TREATMENT - 01/20/20 1458      General Information   Behavior/Cognition Alert;Cooperative;Pleasant mood      Treatment Provided   Treatment provided Cognitive-Linquistic      Cognitive-Linquistic Treatment   Treatment focused on Dysarthria    Skilled Treatment Guy Payne reports that "when I try to swallow (the saliva) it just seems to come right back up again." Pt's MBSS result was a functional oropharyngeal swallow so this report is confusing to SLP. SLP reiterated use of a lozenge or mint - sticking with fruity or butterscotch/sweet flavor if pt has any form of  reflux. Wife stated pt seems more intelligbile than prior to ST. Pt recited his HEP of tongue twisters with excellent success. Pt stated he was practicing this between once and twice a day; SLP reitereated twice a day for best opportunity for progress. SLP used structured tasks for pt to practice overarticulation. SLP took overarticulation into simple conversation for 5 minutes x2 and pt maintained WNL articulation 95% of the time.  Wife reports "He can't stay with a conversation - I need to remind him what we are talking about." SLP explained that we would likely not formally work on this due to pt's brain imaging and FTD diagnosis this would be something that would be expected at this time and due to that we would likely not target it specifically in ST but maybe work on compensations/cues for wife to provide to pt for attention. SLP provided pt and wife with info on setting up talk path therapy and suggested that pt work with attention tasks on that site if pt/wife desire.      Assessment / Recommendations / Plan   Plan Continue with current plan of care      Progression Toward Goals   Progression toward goals  Progressing toward goals            SLP Education - 01/20/20 1738    Education Details likely not formally work on attention given pt's FTD dx but provide compensations if necessary, talkpath therapy to target this if they desire    Person(s) Educated Patient;Spouse    Methods Explanation;Handout    Comprehension Verbalized understanding            SLP Short Term Goals - 01/20/20 1504      SLP SHORT TERM GOAL #1   Title pt will perform HEP for dysarthria with rare min A from SLP x2 sesisons    Baseline 01-20-20    Time 2    Period Weeks    Status On-going      SLP SHORT TERM GOAL #2   Title pt will demonstrate compensations for dysarthria in 5 mintues simple conversation x2 sessions    Baseline 01-20-20    Time 2    Period Weeks    Status On-going      SLP SHORT TERM  GOAL #3   Title pt will report 4 attemtps at functional means to decr hypersalivation (lozenge, gum, alarms to swalllow)    Time 2    Period Weeks    Status On-going            SLP Long Term Goals - 01/20/20 1743      SLP LONG TERM GOAL #1   Title pt will demonstrate 75% use of compensations for dysarthria in 10 mintues simple conversation x3 sessions    Time 6    Period Weeks    Status On-going      SLP LONG TERM GOAL #2   Title pt will complete HEP for dysarthria/intelligiblity with rare min A from SLP for overarticulation ("tongue twisters") x3 sessions    Time 6    Period Weeks    Status On-going      SLP LONG TERM GOAL #3   Title pt will report better speech related QOL score than the score obtained during pt's first 2 sessions    Baseline wife-72, pt-60 reported 12-30-19 (session #2)    Time 6    Period Weeks    Status On-going      SLP LONG TERM GOAL #4   Title pt will verbalize a 1-2 sentence summary of what a voice bank is to SLP with modified indpendence    Time 6    Period Weeks    Status On-going            Plan - 01/20/20 1504    Clinical Impression Statement Guy Payne presents today with 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 Pt cont to report amount of "excess saliva" has incr'd however pt is no longer using lozenges/gum to mitigate excess saliva.He also is dx'd with frontotemporal dementia and due to this demonstrates decr'd memory and attention skills. Cognitive eval and goals will be completed at a later date if clinicially indicated - today pt wife reported pt has difficulty tracking topic in conversations indicating decr'd attention skills. SLP told pt/wife this may be expected given pt's diagnoses, and provided them with talkpath therapy site to work with attention, should they desire. We may focus on compensations for attention should pt/wife desire. Neuropsych testing in june 2019 ID'd MCI  - likely Alzheimer's disease. At times today, Guy Payne's responses and spontaneous comments were mildly socially inappropriate (overly doting on  his wife).Pt will benefit from skilled ST to treat his decr'ing speech intelligbility. If in fact pt's disease process is degenerative speech compensations will be limited to the degree pt can recall to use them or has the motor ability to use them.    Speech Therapy Frequency 2x / week    Duration 8 weeks   or 17 total sessions   Treatment/Interventions Environmental controls;Compensatory techniques;Functional tasks;Multimodal communcation approach;SLP instruction and feedback;Cueing hierarchy;Patient/family education;Cognitive reorganization;Internal/external aids    Potential to Achieve Goals Fair    Potential Considerations Medical prognosis   (possible) referral to ALS specialist pending December 2021   SLP Home Exercise Plan provided    Consulted and Agree with Plan of Care Patient;Family member/caregiver    Family Member Consulted wife           Patient will benefit from skilled therapeutic intervention in order to improve the following deficits and impairments:   Dysarthria and anarthria  Cognitive communication deficit    Problem List Patient Active Problem List   Diagnosis Date Noted  . Periodic limb movements of sleep 05/14/2015  . 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 Glucose intolerance (impaired glucose tolerance) 11/16/2014    Guy Payne ,MS, CCC-SLP  01/20/2020, 5:43 PM  [facility] [address] [place], [place], [address] Phone: [phone]   Fax:  [phone]   Name: Guy Payne MRN: [id number] Date of Birth: 04-Jan-1951

## 2020-01-27 ENCOUNTER — Ambulatory Visit: Payer: Medicare PPO

## 2020-01-31 ENCOUNTER — Ambulatory Visit: Payer: Medicare PPO

## 2020-02-03 ENCOUNTER — Other Ambulatory Visit: Payer: Self-pay

## 2020-02-03 ENCOUNTER — Ambulatory Visit: Payer: Medicare PPO | Attending: Family Medicine

## 2020-02-03 DIAGNOSIS — R41841 Cognitive communication deficit: Secondary | ICD-10-CM | POA: Insufficient documentation

## 2020-02-03 DIAGNOSIS — R471 Dysarthria and anarthria: Secondary | ICD-10-CM | POA: Diagnosis not present

## 2020-02-03 NOTE — Patient Instructions (Signed)
  Tips to help facilitate better attention, concentration, focus   Do harder, longer tasks when you are most alert/awake  Break down larger tasks into small parts  Limit distractions of TV, radio, conversation, e mails/texts, appliance noise, etc - if a job is important, do it in a quiet room  Be aware of how you are functioning in high stimulation environments such as large stores, parties, restaurants - any place with lots of lights, noise, signs etc  Group conversations may be more difficult to process than one on one conversations  Give yourself extra time to process conversation, reading materials, directions or information from your healthcare providers  Organization is key - clutters of laundry, mail, paperwork, dirty dishes - all make it more difficult to concentrate  Before you start a task, have all the needed supplies, directions, recipes ready and organized. This way you don't have to go looking for something in the middle of a task and become distracted.   Be aware of fatigue - take rests or breaks when needed to re-group and re-focus  

## 2020-02-03 NOTE — Therapy (Signed)
[facility] [address] [place], [place], [address] Phone: [phone]   Fax:  [phone]  Speech Language Pathology Treatment  Patient Details  Name: Guy Payne MRN: [id number] Date of Birth: 1951-01-29 Referring Provider (SLP): Sarina Ill, MD   Encounter Date: 02/03/2020   End of Session - 02/03/20 0850    Visit Number 5    Number of Visits 17    Date for SLP Re-Evaluation 03/25/20    Authorization Time Period 12-30-19 to 03-04-20    Authorization - Visit Number 4    SLP Start Time 0805    SLP Stop Time  2836    SLP Time Calculation (min) 45 min    Activity Tolerance Patient tolerated treatment well           Past Medical History:  Diagnosis Date   Fall 2021   MCI (mild cognitive impairment)    Memory loss    Prostate disorder    blockage   Restless leg     Past Surgical History:  Procedure Laterality Date   genital surgery     was not specific   NO PAST SURGERIES     prostate repair for blockage      There were no vitals filed for this visit.          ADULT SLP TREATMENT - 02/03/20 0814      General Information   Behavior/Cognition Alert;Cooperative;Pleasant mood      Treatment Provided   Treatment provided Cognitive-Linquistic      Cognitive-Linquistic Treatment   Treatment focused on Cognition    Skilled Treatment Pt attempts to swallow saliva prior to expectorating. Due to pt and wife both commenting that pt's articulation has improved ST focused today on concentration. SLP ascertained that pt is humorous in conversation due to decr'd concentration in tracking conversation. Pt's wife states she has a nonverbal response to pt's off-topic commenets but pt does not heed these. SLP encouraged wife to continue with this cue and pt will have to be reminded to heed the cue. SLP educated pt and wife on compensatory strategies for attention. SLP and pt, and pt's wife talked  about specific conversational situations that wife could assist pt to aid conversation.      Assessment / Recommendations / Plan   Plan Continue with current plan of care      Progression Toward Goals   Progression toward goals Progressing toward goals            SLP Education - 02/03/20 0850    Education Details compensations to improve conversational attention    Person(s) Educated Patient    Methods Explanation;Verbal cues;Handout    Comprehension Verbalized understanding;Need further instruction;Verbal cues required            SLP Short Term Goals - 02/03/20 1646      SLP SHORT TERM GOAL #1   Title pt will perform HEP for dysarthria with rare min A from SLP x2 sesisons    Baseline 01-20-20    Time 1    Period Weeks    Status On-going      SLP SHORT TERM GOAL #2   Title pt will demonstrate compensations for dysarthria in 5 mintues simple conversation x2 sessions    Baseline 01-20-20    Status Achieved      SLP SHORT TERM GOAL #3   Title pt will report 4 attemtps at functional means to decr hypersalivation (lozenge, gum, alarms to swalllow)  Baseline 12-30-19    Status Partially Met            SLP Long Term Goals - 02/03/20 1647      SLP LONG TERM GOAL #1   Title pt will demonstrate 75% use of compensations for dysarthria in 10 mintues simple conversation x3 sessions    Baseline 02-03-20    Time 5    Period Weeks    Status On-going      SLP LONG TERM GOAL #2   Title pt will complete HEP for dysarthria/intelligiblity with rare min A from SLP for overarticulation ("tongue twisters") x3 sessions    Time 5    Period Weeks    Status On-going      SLP LONG TERM GOAL #3   Title pt will report better speech related QOL score than the score obtained during pt's first 2 sessions    Baseline wife-72, pt-60 reported 12-30-19 (session #2)    Time 5    Period Weeks    Status On-going      SLP LONG TERM GOAL #4   Title pt will verbalize a 1-2 sentence summary of  what a voice bank is to SLP with modified indpendence    Time 5    Period Weeks    Status On-going            Plan - 02/03/20 0850    Clinical Impression Statement Guy Payne presents today with 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 He also is dx'd with frontotemporal dementia and due to this demonstrates decr'd memory and attention skills. Cognitive compensations in conversation were discussed today as pt wife reported pt has cont'd difficulty tracking topic in conversations indicating decr'd attention skills.  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 Pt will benefit from skilled ST to treat his decr'ing speech intelligbility. If in fact pt's disease process is degenerative speech compensations will be limited to the degree pt can recall to use them or has the motor ability to use them.    Speech Therapy Frequency 2x / week    Duration 8 weeks   or 17 total sessions   Treatment/Interventions Environmental controls;Compensatory techniques;Functional tasks;Multimodal communcation approach;SLP instruction and feedback;Cueing hierarchy;Patient/family education;Cognitive reorganization;Internal/external aids    Potential to Achieve Goals Fair    Potential Considerations Medical prognosis   (possible) referral to ALS specialist pending December 2021   SLP Home Exercise Plan provided    Consulted and Agree with Plan of Care Patient;Family member/caregiver    Family Member Consulted wife           Patient will benefit from skilled therapeutic intervention in order to improve the following deficits and impairments:   Dysarthria and anarthria  Cognitive communication deficit    Problem List Patient Active Problem List   Diagnosis Date Noted   Periodic limb movements of sleep 05/14/2015   Amnestic MCI, 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

## 2020-02-04 ENCOUNTER — Ambulatory Visit: Payer: Medicare PPO

## 2020-02-04 DIAGNOSIS — R41841 Cognitive communication deficit: Secondary | ICD-10-CM | POA: Diagnosis not present

## 2020-02-04 DIAGNOSIS — R471 Dysarthria and anarthria: Secondary | ICD-10-CM | POA: Diagnosis not present

## 2020-02-04 NOTE — Therapy (Signed)
[facility] [address] [place], [place], [address] Phone: [phone]   Fax:  [phone]  Speech Language Pathology Treatment  Patient Details  Name: Guy Payne MRN: [id number] Date of Birth: 1950-05-10 Referring Provider (SLP): Sarina Ill, MD   Encounter Date: 02/04/2020   End of Session - 02/04/20 1018    Visit Number 6    Number of Visits 17    Date for SLP Re-Evaluation 03/25/20    Authorization Time Period 12-30-19 to 03-04-20    Authorization - Visit Number 5    SLP Start Time 0805    SLP Stop Time  3818    SLP Time Calculation (min) 46 min    Activity Tolerance Patient tolerated treatment well           Past Medical History:  Diagnosis Date  . Fall 2021  . MCI (mild cognitive impairment)   . Memory loss   . Prostate disorder    blockage  . Restless leg     Past Surgical History:  Procedure Laterality Date  . genital surgery     was not specific  . NO PAST SURGERIES    . prostate repair for blockage      There were no vitals filed for this visit.          ADULT SLP TREATMENT - 02/04/20 0917      General Information   Behavior/Cognition Alert;Cooperative;Pleasant mood      Treatment Provided   Treatment provided Cognitive-Linquistic      Cognitive-Linquistic Treatment   Treatment focused on Cognition    Skilled Treatment SLP addressed pt's impulsivity/lack of inhibiition with verbal expression. See patient instructions. SLP provided insight and counseling for pt and wife about how to interact with pt with strangers and with friends/acquaintances and how these situations may be different. See "pt instructions" for details.      Assessment / Recommendations / Plan   Plan Continue with current plan of care   anticipate d/c in 2-3 weeks/4-6 visits     Progression Toward Goals   Progression toward goals Progressing toward goals            SLP Education - 02/04/20 1017     Education Details ways wife can assist husband in conversational contexts, what pt needs to realize about pragmatics in conversation    Person(s) Educated Patient;Spouse    Methods Explanation    Comprehension Verbalized understanding            SLP Short Term Goals - 02/04/20 1018      SLP SHORT TERM GOAL #1   Title pt will perform HEP for dysarthria with rare min A from SLP x2 sesisons    Baseline 01-20-20    Status Partially Met      SLP SHORT TERM GOAL #2   Title pt will demonstrate compensations for dysarthria in 5 mintues simple conversation x2 sessions    Baseline 01-20-20    Status Achieved      SLP SHORT TERM GOAL #3   Title pt will report 4 attemtps at functional means to decr hypersalivation (lozenge, gum, alarms to swalllow)    Baseline 12-30-19    Status Partially Met            SLP Long Term Goals - 02/04/20 1019      SLP LONG TERM GOAL #1   Title pt will demonstrate 75% use of compensations for dysarthria in 10 mintues simple conversation x3 sessions  Baseline 02-03-20, 02-04-20    Time 5    Period Weeks    Status On-going      SLP LONG TERM GOAL #2   Title pt will complete HEP for dysarthria/intelligiblity with rare min A from SLP for overarticulation ("tongue twisters") x3 sessions    Time 5    Period Weeks    Status On-going      SLP LONG TERM GOAL #3   Title pt will report better speech related QOL score than the score obtained during pt's first 2 sessions    Baseline wife-72, pt-60 reported 12-30-19 (session #2)    Time 5    Period Weeks    Status On-going      SLP LONG TERM GOAL #4   Title pt will verbalize a 1-2 sentence summary of what a voice bank is to SLP with modified indpendence    Time 5    Period Weeks    Status On-going            Plan - 02/04/20 1018    Clinical Impression Statement Guy Payne presents today with 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 He  also is dx'd with frontotemporal dementia and due to this demonstrates decr'd memory and attention skills. Cognitive compensations in conversation were discussed today as pt wife reported pt has cont'd difficulty tracking topic in conversations indicating decr'd attention skills.  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 Pt will benefit from skilled ST to treat his decr'ing speech intelligbility. If in fact pt's disease process is degenerative speech compensations will be limited to the degree pt can recall to use them or has the motor ability to use them.    Speech Therapy Frequency 2x / week    Duration 8 weeks   or 17 total sessions   Treatment/Interventions Environmental controls;Compensatory techniques;Functional tasks;Multimodal communcation approach;SLP instruction and feedback;Cueing hierarchy;Patient/family education;Cognitive reorganization;Internal/external aids    Potential to Achieve Goals Fair    Potential Considerations Medical prognosis   (possible) referral to ALS specialist pending December 2021   SLP Home Exercise Plan provided    Consulted and Agree with Plan of Care Patient;Family member/caregiver    Family Member Consulted wife           Patient will benefit from skilled therapeutic intervention in order to improve the following deficits and impairments:   Dysarthria and anarthria  Cognitive communication deficit    Problem List Patient Active Problem List   Diagnosis Date Noted  . Periodic limb movements of sleep 05/14/2015  . 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 Glucose intolerance (impaired glucose tolerance) 11/16/2014    Elexus Barman ,MS, CCC-SLP  02/04/2020, 10:20 AM  [facility] [address] [place], [place], [address] Phone: [phone]   Fax:  [phone]   Name: Guy Payne MRN: [id number] Date of Birth:  Feb 20, 1951

## 2020-02-04 NOTE — Patient Instructions (Addendum)
-   Sometimes you say things that make people feel awkward; Guy Payne is helping you as your "social counselor"  - If Guy Payne feels awkward, other people awkward - TAKE HER WORD FOR IT  - You don't have to say everything you think!  Guy Payne  - Touching Guy Payne's arm or leg when he's getting off topic  - Touching Guy Payne's arm or leg when Guy Payne feels/knows he is going to try to say something funny that is off topic  - Changing the topic to something Guy Payne is more comfortable talking about  - Gently nudging Guy Payne back to the topic  - It might be necessary at times for you or Guy Payne to explain to people the differences in how you socialize  - Pair a VERBAL cue with a NONVERBAL cue to make the cueing stronger

## 2020-02-10 ENCOUNTER — Other Ambulatory Visit: Payer: Self-pay

## 2020-02-10 ENCOUNTER — Ambulatory Visit: Payer: Medicare PPO

## 2020-02-10 DIAGNOSIS — R41841 Cognitive communication deficit: Secondary | ICD-10-CM

## 2020-02-10 DIAGNOSIS — R471 Dysarthria and anarthria: Secondary | ICD-10-CM | POA: Diagnosis not present

## 2020-02-10 NOTE — Therapy (Signed)
[facility] [address] [place], [place], [address] Phone: [phone]   Fax:  [phone]  Speech Language Pathology Treatment  Patient Details  Name: Guy Payne MRN: [id number] Date of Birth: 1950-03-20 Referring Provider (SLP): Sarina Ill, MD   Encounter Date: 02/10/2020   End of Session - 02/10/20 1703    Visit Number 7    Number of Visits 17    Date for SLP Re-Evaluation 03/25/20    Authorization Time Period 12-30-19 to 03-04-20    Authorization - Visit Number 6    SLP Start Time 0804    SLP Stop Time  0845    SLP Time Calculation (min) 41 min    Activity Tolerance Patient tolerated treatment well           Past Medical History:  Diagnosis Date  . Fall 2021  . MCI (mild cognitive impairment)   . Memory loss   . Prostate disorder    blockage  . Restless leg     Past Surgical History:  Procedure Laterality Date  . genital surgery     was not specific  . NO PAST SURGERIES    . prostate repair for blockage      There were no vitals filed for this visit.   Subjective Assessment - 02/10/20 0810    Subjective (wife) "Things went better in some situations."    Patient is accompained by: Family member   wife-Guy Payne   Currently in Pain? No/denies                 ADULT SLP TREATMENT - 02/10/20 0815      General Information   Behavior/Cognition Alert;Cooperative;Pleasant mood      Treatment Provided   Treatment provided Cognitive-Linquistic      Cognitive-Linquistic Treatment   Treatment focused on Cognition    Skilled Treatment Pt stated he hasn't cont'd with lozenges or gum to manage oral saliva. SLP again explained this to pt and he stated it osunded like a good idea - wife bought him some lozenges for this but pt hasn't used them (SLP ?s if pt remembers to use them). SLP addressed pt's weekend events with impulsivity/lack of inhibiition with verbal expression.Pt req'd cues for  pragmatics of certain comments that wife recalled from the weekend. SLP reminded wife and Guy Payne that some of the behaviors ("running away" after an inappropriate comment) might not be elimintated with Guy Payne's condition. Guy Payne told SLP, upon questioning, that he feels ashamed if Guy Payne says something even if she says it only to him, quietly. SLP asked Guy Payne what Guy Payne could do something else which would not make him feel ashamed and Guy Payne said that putting finger over her lips might be helpful. SLP also educated pt/wife about Constant [employer] app.      Assessment / Recommendations / Plan   Plan Continue with current plan of care      Progression Toward Goals   Progression toward goals Progressing toward goals            SLP Education - 02/10/20 1703    Education Details constant therapy app, lozenges and rationale    Person(s) Educated Patient;Spouse    Methods Explanation    Comprehension Verbalized understanding;Need further instruction            SLP Short Term Goals - 02/04/20 1018      SLP SHORT TERM GOAL #1   Title pt will perform HEP for dysarthria with rare min A  from SLP x2 sesisons    Baseline 01-20-20    Status Partially Met      SLP SHORT TERM GOAL #2   Title pt will demonstrate compensations for dysarthria in 5 mintues simple conversation x2 sessions    Baseline 01-20-20    Status Achieved      SLP SHORT TERM GOAL #3   Title pt will report 4 attemtps at functional means to decr hypersalivation (lozenge, gum, alarms to swalllow)    Baseline 12-30-19    Status Partially Met            SLP Long Term Goals - 02/10/20 1705      SLP LONG TERM GOAL #1   Title pt will demonstrate 75% use of compensations for dysarthria in 10 mintues simple conversation x3 sessions    Baseline 02-03-20, 02-04-20    Status Achieved      SLP LONG TERM GOAL #2   Title pt will complete HEP for dysarthria/intelligiblity with rare min A from SLP for overarticulation ("tongue twisters") x3  sessions    Time 4    Period Weeks    Status On-going      SLP LONG TERM GOAL #3   Title pt will report better speech related QOL score than the score obtained during pt's first 2 sessions    Baseline wife-72, pt-60 reported 12-30-19 (session #2)    Time 4    Period Weeks    Status On-going      SLP LONG TERM GOAL #4   Title pt will verbalize a 1-2 sentence summary of what a voice bank is to SLP with modified indpendence    Time 4    Period Weeks    Status On-going            Plan - 02/10/20 1704    Clinical Impression Statement Guy Payne presents today with 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 He also is dx'd with frontotemporal dementia and due to this demonstrates decr'd memory and attention skills. Cognitive compensations in conversation that occurred over the weekend were discussed today.  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 Pt will benefit from skilled ST to treat his decr'ing speech intelligbility. If in fact pt's disease process is degenerative speech compensations will be limited to the degree pt can recall to use them or has the motor ability to use them.    Speech Therapy Frequency 2x / week    Duration 8 weeks   or 17 total sessions   Treatment/Interventions Environmental controls;Compensatory techniques;Functional tasks;Multimodal communcation approach;SLP instruction and feedback;Cueing hierarchy;Patient/family education;Cognitive reorganization;Internal/external aids    Potential to Achieve Goals Fair    Potential Considerations Medical prognosis   (possible) referral to ALS specialist pending December 2021   SLP Home Exercise Plan provided    Consulted and Agree with Plan of Care Patient;Family member/caregiver    Family Member Consulted wife           Patient will benefit from skilled therapeutic intervention in order to improve the following deficits and  impairments:   Dysarthria and anarthria  Cognitive communication deficit    Problem List Patient Active Problem List   Diagnosis Date Noted  . Periodic limb movements of sleep 05/14/2015  . 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 Glucose intolerance (impaired glucose tolerance) 11/16/2014    Royal Vandevoort ,[place], CCC-SLP  02/10/2020, 5:06 PM  [facility] [address] [place], [place], [address] Phone: [phone]   Fax:  [phone]   Name: KELL FERRIS MRN: [id number] Date of Birth: 1950-05-23

## 2020-02-11 DIAGNOSIS — R471 Dysarthria and anarthria: Secondary | ICD-10-CM | POA: Diagnosis not present

## 2020-02-11 DIAGNOSIS — G1220 Motor neuron disease, unspecified: Secondary | ICD-10-CM | POA: Diagnosis not present

## 2020-02-12 ENCOUNTER — Ambulatory Visit: Payer: Medicare PPO

## 2020-02-12 ENCOUNTER — Other Ambulatory Visit: Payer: Self-pay

## 2020-02-12 DIAGNOSIS — R471 Dysarthria and anarthria: Secondary | ICD-10-CM

## 2020-02-12 DIAGNOSIS — R41841 Cognitive communication deficit: Secondary | ICD-10-CM

## 2020-02-12 NOTE — Therapy (Signed)
[facility] [address] [place], [place], [address] Phone: [phone]   Fax:  [phone]  Speech Language Pathology Treatment  Patient Details  Name: Guy Payne MRN: [id number] Date of Birth: 1950-03-09 Referring Provider (SLP): Sarina Ill, MD   Encounter Date: 02/12/2020   End of Session - 02/12/20 0917    Visit Number 8    Number of Visits 17    Date for SLP Re-Evaluation 03/25/20    Authorization Time Period 12-30-19 to 03-04-20    Authorization - Visit Number 7    SLP Start Time 0803    SLP Stop Time  0845    SLP Time Calculation (min) 42 min    Activity Tolerance Patient tolerated treatment well           Past Medical History:  Diagnosis Date  . Fall 2021  . MCI (mild cognitive impairment)   . Memory loss   . Prostate disorder    blockage  . Restless leg     Past Surgical History:  Procedure Laterality Date  . genital surgery     was not specific  . NO PAST SURGERIES    . prostate repair for blockage      There were no vitals filed for this visit.   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 "This seems familiar, have I been here before?" (sarcasm)    Patient is accompained by: Family member   Guy Payne-wife   Currently in Pain? No/denies                 ADULT SLP TREATMENT - 02/12/20 0807      General Information   Behavior/Cognition Alert;Cooperative;Pleasant mood      Treatment Provided   Treatment provided Cognitive-Linquistic;Dysphagia      Dysphagia Treatment   Other treatment/comments Pt reports saliva cont as incr'd - not having mint/lozenge as compensation. Pt reports he eats and reads or does chores while eating - SLP suggested pt focus on eating when he is eating.      Cognitive-Linquistic Treatment   Treatment focused on Cognition    Skilled Treatment Pt sequenced 6-8  card sequences to target attention. Sustained attention observed as decr'd, as pt flipped cards around and did not appear to have a plan to put cards in correct order. Pt demonstrated he had trouble maintaining attention for > 90 seconds. He req'd consistent mod cues for sequencing.      Assessment / Recommendations / Plan   Plan Continue with current plan of care      Progression Toward Goals   Progression toward goals Progressing toward goals              SLP Short Term Goals - 02/04/20 1018      SLP SHORT TERM GOAL #1   Title pt will perform HEP for dysarthria with rare min A from SLP x2 sesisons    Baseline 01-20-20    Status Partially Met      SLP SHORT TERM GOAL #2   Title pt will demonstrate compensations for dysarthria in 5 mintues simple conversation x2 sessions    Baseline 01-20-20    Status Achieved      SLP SHORT TERM GOAL #3   Title pt will report 4 attemtps at functional means to decr hypersalivation (lozenge, gum, alarms to swalllow)    Baseline 12-30-19  Status Partially Met            SLP Long Term Goals - 02/12/20 0919      SLP LONG TERM GOAL #1   Title pt will demonstrate 75% use of compensations for dysarthria in 10 mintues simple conversation x3 sessions    Baseline 02-03-20, 02-04-20    Status Achieved      SLP LONG TERM GOAL #2   Title pt will complete HEP for dysarthria/intelligiblity with rare min A from SLP for overarticulation ("tongue twisters") x3 sessions    Time 4    Period Weeks    Status On-going      SLP LONG TERM GOAL #3   Title pt will report better speech related QOL score than the score obtained during pt's first 2 sessions    Baseline wife-72, pt-60 reported 12-30-19 (session #2)    Time 4    Period Weeks    Status On-going      SLP LONG TERM GOAL #4   Title pt will verbalize a 1-2 sentence summary of what a voice bank is to SLP with modified indpendence    Time 4    Period Weeks    Status On-going            Plan  - 02/12/20 0917    Clinical Impression Statement Guy Payne presents today with 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 He also is dx'd with frontotemporal dementia and due to this demonstrates decr'd memory and attention skills. 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 Pt will benefit from skilled ST to treat his decr'ing speech intelligbility. If in fact pt's disease process is degenerative speech compensations will be limited to the degree pt can recall to use them or has the motor ability to use them.    Speech Therapy Frequency 2x / week    Duration 8 weeks   or 17 total sessions   Treatment/Interventions Environmental controls;Compensatory techniques;Functional tasks;Multimodal communcation approach;SLP instruction and feedback;Cueing hierarchy;Patient/family education;Cognitive reorganization;Internal/external aids    Potential to Achieve Goals Fair    Potential Considerations Medical prognosis   (possible) referral to ALS specialist pending December 2021   SLP Home Exercise Plan provided    Consulted and Agree with Plan of Care Patient;Family member/caregiver    Family Member Consulted wife           Patient will benefit from skilled therapeutic intervention in order to improve the following deficits and impairments:   Cognitive communication deficit  Dysarthria and anarthria    Problem List Patient Active Problem List   Diagnosis Date Noted  . Periodic limb movements of sleep 05/14/2015  . 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 Glucose intolerance (impaired glucose tolerance) 11/16/2014    Chastidy Ranker ,MS, CCC-SLP  02/12/2020, 9:21 AM  [facility] [address] [place], [place], [address] Phone: [phone]   Fax:   [phone]   Name: Guy Payne MRN: [id number] Date of Birth: February 25, 1950

## 2020-02-17 ENCOUNTER — Ambulatory Visit: Payer: Medicare PPO

## 2020-02-18 ENCOUNTER — Ambulatory Visit: Payer: Medicare PPO

## 2020-02-18 ENCOUNTER — Other Ambulatory Visit: Payer: Self-pay

## 2020-02-18 DIAGNOSIS — R41841 Cognitive communication deficit: Secondary | ICD-10-CM

## 2020-02-18 DIAGNOSIS — R471 Dysarthria and anarthria: Secondary | ICD-10-CM | POA: Diagnosis not present

## 2020-02-18 NOTE — Therapy (Signed)
Guy Payne [address] [place], [place], [address] Phone: [phone]   Fax:  [phone]  Speech Language Pathology Treatment  Patient Details  Name: Guy Payne MRN: [id number] Date of Birth: 1950-02-25 Referring Provider (SLP): Sarina Ill, MD   Encounter Date: 02/18/2020   End of Session - 02/18/20 1716    Visit Number 9    Number of Visits 17    Date for SLP Re-Evaluation 03/25/20    Authorization Time Period 12-30-19 to 03-04-20    Authorization - Visit Number 8    Authorization - Number of Visits 15    SLP Start Time 0805    SLP Stop Time  9024    SLP Time Calculation (min) 41 min    Activity Tolerance Patient tolerated treatment well           Past Medical History:  Diagnosis Date  . Fall 2021  . MCI (mild cognitive impairment)   . Memory loss   . Prostate disorder    blockage  . Restless leg     Past Surgical History:  Procedure Laterality Date  . genital surgery     was not specific  . NO PAST SURGERIES    . prostate repair for blockage      There were no vitals filed for this visit.   Subjective Assessment - 02/18/20 0808    Subjective Pt and wife were on app last night.    Patient is accompained by: Family member   Guy Payne-wife   Currently in Pain? No/denies                 ADULT SLP TREATMENT - 02/18/20 0818      General Information   Behavior/Cognition Alert;Cooperative;Pleasant mood      Treatment Provided   Treatment provided Cognitive-Linquistic      Cognitive-Linquistic Treatment   Treatment focused on Cognition    Skilled Treatment Pt completed his HEP for dysarthria with independence.Guy Payne and SLP again discussed what comments may or may not be appropriate, with wife's assistance. SLp talked with Guy Payne and wife about Talk PAth and Constant Therapy and congratulated wife on implementing some things to assist pt at home and in community that she learned in [place].  Pt's speech today 100% inteligible, with pt taking notable effort to slow rate and overarticulate. Suspect pt will need 3-4  more sessions. SLP revisited voice banking with pt and wife and they provided example      Assessment / Recommendations / Plan   Plan Continue with current plan of care      Progression Toward Goals   Progression toward goals Progressing toward goals            SLP Education - 02/18/20 1715    Education Details wife beginning to adopt things learned in [place] to home, voice banking    Person(s) Educated Patient;Spouse    Methods Explanation;Handout    Comprehension Verbalized understanding            SLP Short Term Goals - 02/04/20 1018      SLP SHORT TERM GOAL #1   Title pt will perform HEP for dysarthria with rare min A from SLP x2 sesisons    Baseline 01-20-20    Status Partially Met      SLP SHORT TERM GOAL #2   Title pt will demonstrate compensations for dysarthria in 5 mintues simple conversation x2 sessions    Baseline 01-20-20    Status Achieved  SLP SHORT TERM GOAL #3   Title pt will report 4 attemtps at functional means to decr hypersalivation (lozenge, gum, alarms to swalllow)    Baseline 12-30-19    Status Partially Met            SLP Long Term Goals - 02/18/20 0824      SLP LONG TERM GOAL #1   Title pt will demonstrate 75% use of compensations for dysarthria in 10 mintues simple conversation x3 sessions    Baseline 02-03-20, 02-04-20    Status Achieved      SLP LONG TERM GOAL #2   Title pt will complete HEP for dysarthria/intelligiblity with rare min A from SLP for overarticulation ("tongue twisters") x3 sessions    Baseline 02-18-20    Time 3    Period Weeks    Status On-going      SLP LONG TERM GOAL #3   Title pt will report better speech related QOL score than the score obtained during pt's first 2 sessions    Baseline wife-72, pt-60 reported 12-30-19 (session #2)    Time 3    Period Weeks    Status On-going      SLP  LONG TERM GOAL #4   Title pt will verbalize a 1-2 sentence summary of what a voice bank is to SLP with modified indpendence    Time 3    Period Weeks    Status On-going            Plan - 02/18/20 1716    Clinical Impression Statement Guy Payne presents today with 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 He also is dx'd with frontotemporal dementia and due to this demonstrates decr'd memory and attention skills - pt also demonstrates impulsivity which affects his pragmatics (social language). 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 Pt will benefit from skilled ST to treat his decr'ing speech intelligbility - and education for pt/wife as to how to manage pt's decr'd pragmatics.    Speech Therapy Frequency 2x / week    Duration 8 weeks   or 17 total sessions   Treatment/Interventions Environmental controls;Compensatory techniques;Functional tasks;Multimodal communcation approach;SLP instruction and feedback;Cueing hierarchy;Patient/family education;Cognitive reorganization;Internal/external aids    Potential to Achieve Goals Fair    Potential Considerations Medical prognosis   (possible) referral to ALS specialist pending December 2021   SLP Home Exercise Plan provided    Consulted and Agree with Plan of Care Patient;Family member/caregiver    Family Member Consulted wife           Patient will benefit from skilled therapeutic intervention in order to improve the following deficits and impairments:   Dysarthria and anarthria  Cognitive communication deficit    Problem List Patient Active Problem List   Diagnosis Date Noted  . Periodic limb movements of sleep 05/14/2015  . 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 Glucose intolerance (impaired glucose tolerance) 11/16/2014     , ,[place], CCC-SLP  02/18/2020, 5:19 PM  [facility] [address] [place], [place], [address] Phone: [phone]   Fax:  [phone]   Name: Guy Payne MRN: [id number] Date of Birth: [birth year]

## 2020-02-26 ENCOUNTER — Other Ambulatory Visit: Payer: Self-pay

## 2020-02-26 ENCOUNTER — Ambulatory Visit: Payer: Medicare PPO | Attending: Family Medicine

## 2020-02-26 DIAGNOSIS — R41841 Cognitive communication deficit: Secondary | ICD-10-CM | POA: Diagnosis not present

## 2020-02-26 DIAGNOSIS — R471 Dysarthria and anarthria: Secondary | ICD-10-CM | POA: Diagnosis not present

## 2020-02-26 NOTE — Therapy (Signed)
[facility] [address] [place], [place], [address] Phone: [phone]   Fax:  [phone]  Speech Language Pathology Treatment/Progress note  Patient Details  Name: Guy Payne MRN: [id number] Date of Birth: December 30, 1950 Referring Provider (SLP): Sarina Ill, MD   Encounter Date: 02/26/2020   End of Session - 02/26/20 1103    Visit Number 10    Number of Visits 17    Date for SLP Re-Evaluation 03/25/20    Authorization Time Period 12-30-19 to 03-04-20    Authorization - Visit Number 8    Authorization - Number of Visits 15    SLP Start Time 0848    SLP Stop Time  0930    SLP Time Calculation (min) 42 min    Activity Tolerance Patient tolerated treatment well           Past Medical History:  Diagnosis Date  . Fall 2021  . MCI (mild cognitive impairment)   . Memory loss   . Prostate disorder    blockage  . Restless leg     Past Surgical History:  Procedure Laterality Date  . genital surgery     was not specific  . NO PAST SURGERIES    . prostate repair for blockage      There were no vitals filed for this visit.   Subjective Assessment - 02/26/20 0854    Subjective "I have to have a magnifier in front of me for some of those (online apps for attention)." Wife states diagnosis is likely ALS, per [facility]. Pt scheduled for 04-22-20 for ALS clinic.    Patient is accompained by: Family member   Jacqlyn Larsen - wife   Currently in Pain? No/denies                 ADULT SLP TREATMENT - 02/26/20 0855      General Information   Behavior/Cognition Alert;Cooperative;Pleasant mood      Treatment Provided   Treatment provided Cognitive-Linquistic      Dysphagia Treatment   Other treatment/comments In this session SLP leared that during meals pt coughing once/day. SLP asked pt to begin to write down when he coughs and what food item he is coughing on, in order to track trends. Pt cont to complain of copious  saliva when he wakes at night to use restroom- has to expectorate. SLP reminded pt better to swallow than to expectorate. SLP asked pt about use of lozenges and mints. Pt unsure at this time if these are helpful during the day but pt only rarely uses these currently. Pt may require follow up modified barium swallow (MBSS)- pt to bring in food next session for assessment of swallow at bedside and assess for necessity of MBSS.      Cognitive-Linquistic Treatment   Treatment focused on Cognition    Skilled Treatment Initially (first 30 seconds) pt did not use speech compensations, but began to do so after that initial segment. SLP told Zafir SLP recommended at least 4 days a week with these apps. SLP told pt 1 hour of constant therapy at least 4 days/week, no less than 20 minutes at a time. SLP suggested pt and wife write down their schedule for following day and pt can choose when to do tasks, fitting them in to his day. SLP targeted pragmatics of language with pt and rehearsed some scenarios in which pt stated comments he might say, and SLP guided pt to understanding why the comment would not be  appropriate for the social situation. SLP provided pt some more socially appropriate comments (e.g., "I like that watch," or "That sweater is a good color on you!") During this entire session, Demorio maintained speech compensations at least 90% of the time.      Assessment / Recommendations / Plan   Plan Continue with current plan of care      Progression Toward Goals   Progression toward goals Progressing toward goals            SLP Education - 02/26/20 1102    Education Details lozenges/mints for controlling saliva during the day    Person(s) Educated Patient;Spouse    Methods Explanation    Comprehension Verbalized understanding            SLP Short Term Goals - 02/04/20 1018      SLP SHORT TERM GOAL #1   Title pt will perform HEP for dysarthria with rare min A from SLP x2 sesisons    Baseline  01-20-20    Status Partially Met      SLP SHORT TERM GOAL #2   Title pt will demonstrate compensations for dysarthria in 5 mintues simple conversation x2 sessions    Baseline 01-20-20    Status Achieved      SLP SHORT TERM GOAL #3   Title pt will report 4 attemtps at functional means to decr hypersalivation (lozenge, gum, alarms to swalllow)    Baseline 12-30-19    Status Partially Met            SLP Long Term Goals - 02/26/20 1103      SLP LONG TERM GOAL #1   Title pt will demonstrate 75% use of compensations for dysarthria in 10 mintues simple conversation x3 sessions    Baseline 02-03-20, 02-04-20    Status Achieved      SLP LONG TERM GOAL #2   Title pt will complete HEP for dysarthria/intelligiblity with rare min A from SLP for overarticulation ("tongue twisters") x3 sessions    Baseline 02-18-20, 02-26-19    Time 2    Period Weeks    Status On-going      SLP LONG TERM GOAL #3   Title pt will report better speech related QOL score than the score obtained during pt's first 2 sessions    Baseline wife-72, pt-60 reported 12-30-19 (session #2)    Time 2    Period Weeks    Status On-going      SLP LONG TERM GOAL #4   Title pt will verbalize a 1-2 sentence summary of what a voice bank is to SLP with modified indpendence    Time 2    Period Weeks    Status On-going            Plan - 02/26/20 1352    Clinical Impression Statement Kushal Saunders presents today with 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 MDs at [facility] have postulated bulbar ALS. He also is dx'd with frontotemporal dementia and due to this demonstrates decr'd memory and attention skills - pt also demonstrates impulsivity which affects his pragmatics (social language). 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 Pt will benefit from cont'd skilled ST to treat his decr'ing speech  intelligbility - and education for pt/wife as to how to manage pt's decr'd pragmatics.    Speech Therapy Frequency 2x / week  Duration 8 weeks   or 17 total sessions   Treatment/Interventions Environmental controls;Compensatory techniques;Functional tasks;Multimodal communcation approach;SLP instruction and feedback;Cueing hierarchy;Patient/family education;Cognitive reorganization;Internal/external aids    Potential to Achieve Goals Fair    Potential Considerations Medical prognosis   (possible) referral to ALS specialist pending December 2021   SLP Home Exercise Plan provided    Consulted and Agree with Plan of Care Patient;Family member/caregiver    Family Member Consulted wife           Patient will benefit from skilled therapeutic intervention in order to improve the following deficits and impairments:   Dysarthria and anarthria  Cognitive communication deficit   Speech Therapy Progress Note  Dates of Reporting Period: 12-26-19 to present  Subjective Statement: Pt has underwent 10 ST sessions focusing on dysarthria, cognitive linguistic deficit, and dysphagia.   Objective: Pt's dysarthria has improved due to pt following compensatory measures. SLP to assess pt with food next session, and determine if referral for swallow test is warranted. He is completing cognitve linguistic tasks at home via Constant Therapy.  Goal Update: see above  Plan: see above  Reason Skilled Services are Required: Pt will require 2-3 more sessions for pt/family education. If pt requires swallow assessment he may need to be seen longer course due to possibly requiring swallowing exercises.   Problem List Patient Active Problem List   Diagnosis Date Noted  . Periodic limb movements of sleep 05/14/2015  . 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 Glucose intolerance (impaired glucose tolerance) 11/16/2014    Alick Lecomte ,MS, CCC-SLP  02/26/2020, 1:54  PM  [facility] [address] [place], [place], [address] Phone: [phone]   Fax:  [phone]   Name: DEMITRIOS MOLYNEUX MRN: [id number] Date of Birth: 03-Aug-1950

## 2020-02-28 ENCOUNTER — Ambulatory Visit: Payer: Medicare PPO

## 2020-02-28 ENCOUNTER — Other Ambulatory Visit: Payer: Self-pay

## 2020-02-28 DIAGNOSIS — R41841 Cognitive communication deficit: Secondary | ICD-10-CM | POA: Diagnosis not present

## 2020-02-28 DIAGNOSIS — R471 Dysarthria and anarthria: Secondary | ICD-10-CM

## 2020-02-29 NOTE — Therapy (Signed)
[facility] [address] [place], [place], [address] Phone: [phone]   Fax:  [phone]  Speech Language Pathology Treatment  Patient Details  Name: Guy Payne MRN: [id number] Date of Birth: [birth year] Referring Provider (SLP): Sarina Ill, MD   Encounter Date: 02/28/2020   End of Session - 02/29/20 0017    Visit Number 11    Number of Visits 17    Date for SLP Re-Evaluation 03/25/20    Authorization Time Period 12-30-19 to 03-04-20    Authorization - Visit Number 9    Authorization - Number of Visits 15    SLP Start Time [phone]    SLP Stop Time  0849    SLP Time Calculation (min) 38 min    Activity Tolerance Patient tolerated treatment well           Past Medical History:  Diagnosis Date  . Fall 2021  . MCI (mild cognitive impairment)   . Memory loss   . Prostate disorder    blockage  . Restless leg     Past Surgical History:  Procedure Laterality Date  . genital surgery     was not specific  . NO PAST SURGERIES    . prostate repair for blockage      There were no vitals filed for this visit.   Subjective Assessment - 02/28/20 0815    Subjective "Oh, we forgot! (the food)"    Patient is accompained by: Family member   becky   Currently in Pain? No/denies                 ADULT SLP TREATMENT - 02/28/20 0823      General Information   Behavior/Cognition Alert;Cooperative;Pleasant mood      Treatment Provided   Treatment provided Cognitive-Linquistic      Cognitive-Linquistic Treatment   Treatment focused on Cognition    Skilled Treatment Pt states he has difficulty with names, with how to get places, and details of discussions. SLP suggested some compensations for each of these difficulties and pt wife is using many of the suggesteions SLP mentioned. SLP talked about pt difficulty with selective attentoin due to example by wife of pt not being able to finish breakfast but doing  10 other things so he takes 90 minutes to eat breakfast. SLP told pt and wife time to decr to once/week or once every other week might be coming so that SLP can act as a Optometrist.      Assessment / Recommendations / Plan   Plan Continue with current plan of care      Progression Toward Goals   Progression toward goals Progressing toward goals              SLP Short Term Goals - 02/29/20 0014      SLP SHORT TERM GOAL #1   Title pt will perform HEP for dysarthria with rare min A from SLP x2 sesisons    Baseline 01-20-20    Status Partially Met      SLP SHORT TERM GOAL #2   Title pt will demonstrate compensations for dysarthria in 5 mintues simple conversation x2 sessions    Baseline 01-20-20    Status Achieved      SLP SHORT TERM GOAL #3   Title pt will report 4 attemtps at functional means to decr hypersalivation (lozenge, gum, alarms to swalllow)    Baseline 12-30-19    Status Partially Met  SLP Long Term Goals - 02/29/20 0014      SLP LONG TERM GOAL #1   Title pt will demonstrate 75% use of compensations for dysarthria in 10 mintues simple conversation x3 sessions    Baseline 02-03-20, 02-04-20    Status Achieved      SLP LONG TERM GOAL #2   Title pt will complete HEP for dysarthria/intelligiblity with rare min A from SLP for overarticulation ("tongue twisters") x3 sessions    Baseline 02-18-20, 02-26-19    Time 2    Period Weeks    Status On-going      SLP LONG TERM GOAL #3   Title pt will report better speech related QOL score than the score obtained during pt's first 2 sessions    Baseline wife-72, pt-60 reported 12-30-19 (session #2)    Time 2    Period Weeks    Status On-going      SLP LONG TERM GOAL #4   Title pt will verbalize a 1-2 sentence summary of what a voice bank is to SLP with modified indpendence    Time 2    Period Weeks    Status On-going      SLP LONG TERM GOAL #5   Title pt and/or wife will report pt and wife engaging in  compensations for pt's attention and pragmatic challenges with strangers in 2 sessions    Time 2    Period [place] - 02/29/20 0017    Clinical Impression Statement Guy Payne presents today with 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 He also is dx'd with mesiotemporal atrophy and due to this demonstrates decr'd memory and attention skills - pt also demonstrates impulsivity which affects his pragmatics (social language). 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 Pt will benefit from cont'd skilled ST to treat his decr'ing speech intelligbility - and education for pt/wife as to how to manage pt's decr'd pragmatics.    Speech Therapy Frequency 2x / week    Duration 8 weeks   or 17 total sessions   Treatment/Interventions Environmental controls;Compensatory techniques;Functional tasks;Multimodal communcation approach;SLP instruction and feedback;Cueing hierarchy;Patient/family education;Cognitive reorganization;Internal/external aids    Potential to Achieve Goals Fair    Potential Considerations Medical prognosis   (possible) referral to ALS specialist pending December 2021   SLP Home Exercise Plan provided    Consulted and Agree with Plan of Care Patient;Family member/caregiver    Family Member Consulted wife           Patient will benefit from skilled therapeutic intervention in order to improve the following deficits and impairments:   Dysarthria and anarthria  Cognitive communication deficit    Problem List Patient Active Problem List   Diagnosis Date Noted  . Periodic limb movements of sleep 05/14/2015  . 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 Glucose intolerance (impaired glucose tolerance) 11/16/2014    Karlina Suares ,MS,  CCC-SLP  02/29/2020, 12:19 AM  [facility] [address] [place], [place], [address] Phone: [phone]   Fax:  [phone]   Name: Guy Payne MRN: [id number] Date of Birth: 12/17/1950

## 2020-03-02 ENCOUNTER — Ambulatory Visit: Payer: Medicare PPO

## 2020-03-04 ENCOUNTER — Ambulatory Visit: Payer: Medicare PPO

## 2020-03-16 ENCOUNTER — Other Ambulatory Visit: Payer: Self-pay

## 2020-03-16 ENCOUNTER — Ambulatory Visit: Payer: Medicare PPO

## 2020-03-16 DIAGNOSIS — R41841 Cognitive communication deficit: Secondary | ICD-10-CM | POA: Diagnosis not present

## 2020-03-16 DIAGNOSIS — R471 Dysarthria and anarthria: Secondary | ICD-10-CM | POA: Diagnosis not present

## 2020-03-16 NOTE — Patient Instructions (Signed)
  Remember to let Kriste Basque be your filter - sometimes you forget what makes people feel happy and what makes them feel uncomfortable. Strangers may laugh at your jokes because they feel awkward.

## 2020-03-16 NOTE — Therapy (Addendum)
[facility] [address] [place], [place], [address] Phone: [phone]   Fax:  [phone]  Speech Language Pathology Treatment/Renewal Summary  Patient Details  Name: Guy Payne MRN: [id number] Date of Birth: [birth year] Referring Provider (SLP): Naomie Dean, MD   Encounter Date: 03/16/2020   End of Session - 03/16/20 1157    Visit Number 12    Number of Visits 17    Date for SLP Re-Evaluation 04/16/20    Authorization Time Period pending    Authorization - Visit Number 10    Authorization - Number of Visits 15    SLP Start Time 0806    SLP Stop Time  0846    SLP Time Calculation (min) 40 min    Activity Tolerance Patient tolerated treatment well           Past Medical History:  Diagnosis Date  . Fall 2021  . MCI (mild cognitive impairment)   . Memory loss   . Prostate disorder    blockage  . Restless leg     Past Surgical History:  Procedure Laterality Date  . genital surgery     was not specific  . NO PAST SURGERIES    . prostate repair for blockage      There were no vitals filed for this visit.   Subjective Assessment - 03/16/20 0811    Subjective "You can hear him cough sometimes when he's eating." (wife, Kriste Basque)    Currently in Pain? No/denies                 ADULT SLP TREATMENT - 03/16/20 0814      General Information   Behavior/Cognition Alert;Cooperative;Pleasant mood      Treatment Provided   Treatment provided Dysphagia      Dysphagia Treatment   Treatment Methods Skilled observation;Compensation strategy training;Patient/caregiver education    Patient observed directly with PO's Yes    Type of PO's observed Dysphagia 3 (soft);Thin liquids    Pharyngeal Phase Signs & Symptoms Delayed cough    Amount of cueing Moderate   wife's arm on pt's arm to stop nibbling   Other treatment/comments Pt coughs occasionally - wife states with hot liquids more often than with  food. Pt coughed x2 with liquids (immediate) and once with solids (delayed). SLP noted pt nibbling his food and suggested pt take small bites instead of nibbling many tiny bites - pt wife needed to cue pt with tactile cues on arm due to decr'd memory/attention. Pt talking with POs but these times did not produce pt's three cough responses. Pt/wife did not make cough log so SLP asked again to make log      Cognitive-Linquistic Treatment   Treatment focused on Cognition    Skilled Treatment Becky and family needed to cue pt to not joke with strangers, twice, since last session. SLP reiterated to pt pragmatic rules of language make it uncomfortable for strangers to be joked with using pt's means of joking. Wife confirmed this and SLP stressed to patient to allow Kriste Basque to be his filter for what is and what is not appropriate in conversation with strangers - pt reminded SLP his goal of wanting people to "be happy" but SLP suggested a smile instead, to meet his goal to have strangers "be happy".      Assessment / Recommendations / Plan   Plan Continue with current plan of care      Dysphagia Recommendations   Diet recommendations --  as tolerated     Progression Toward Goals   Progression toward goals Progressing toward goals            SLP Education - 03/16/20 1156    Education Details take small bites instead of nibbling    Person(s) Educated Patient;Spouse    Methods Explanation;Tactile cues;Verbal cues    Comprehension Verbalized understanding;Tactile cues required;Need further instruction;Verbal cues required;Returned demonstration            SLP Short Term Goals - 02/29/20 0014      SLP SHORT TERM GOAL #1   Title pt will perform HEP for dysarthria with rare min A from SLP x2 sesisons    Baseline 01-20-20    Status Partially Met      SLP SHORT TERM GOAL #2   Title pt will demonstrate compensations for dysarthria in 5 mintues simple conversation x2 sessions    Baseline 01-20-20     Status Achieved      SLP SHORT TERM GOAL #3   Title pt will report 4 attemtps at functional means to decr hypersalivation (lozenge, gum, alarms to swalllow)    Baseline 12-30-19    Status Partially Met            SLP Long Term Goals - 03/16/20 1201      SLP LONG TERM GOAL #1   Title pt will demonstrate 75% use of compensations for dysarthria in 10 mintues simple conversation x3 sessions    Baseline 02-03-20, 02-04-20    Status Achieved      SLP LONG TERM GOAL #2   Title pt will complete HEP for dysarthria/intelligiblity with rare min A from SLP for overarticulation ("tongue twisters") x3 sessions    Baseline 02-18-20, 02-26-19    Time 4    Period Weeks    Status On-going      SLP LONG TERM GOAL #3   Title pt will report better speech related QOL score than the score obtained during pt's first 2 sessions    Baseline wife-72, pt-60 reported 12-30-19 (session #2)    Time 4    Period Weeks    Status On-going      SLP LONG TERM GOAL #4   Title pt will verbalize a 1-2 sentence summary of what a voice bank is to SLP with modified indpendence    Time 4    Period Weeks    Status On-going      SLP LONG TERM GOAL #5   Title pt and/or wife will report pt and wife engaging in compensations for pt's attention and pragmatic challenges with strangers in 2 sessions    Time 4    Period Weeks    Status On-going            Plan - 03/16/20 1158    Clinical Impression Statement Anjelo Pullman presents today with 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 Pt with three coughs today with POs (one with solids and two with liquids - see "other treatment" for more details. No need, in this SLP's opinion, for objective swallow test at this time. SLP to screen pt's swallow next session as well, and pt and wife will keep cough log to show to SLP  (were supposed to have this today but both pt and wife forgot to begin this). He also is  dx'd with mesiotemporal atrophy and due to this demonstrates decr'd memory and attention skills - pt also demonstrates impulsivity which affects his pragmatics (social language). 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 Pt will benefit from as many as 4 more skilled ST sessions to ensure speech intelligbility remains good- and continued education and training for pt/wife how to manage pt's decr'd cogntive-pragmatic skills.    Speech Therapy Frequency 2x / week    Duration 4 weeks   or 17 total sessions   Treatment/Interventions Environmental controls;Compensatory techniques;Functional tasks;Multimodal communcation approach;SLP instruction and feedback;Cueing hierarchy;Patient/family education;Cognitive reorganization;Internal/external aids    Potential to Achieve Goals Fair    Potential Considerations Medical prognosis   (possible) referral to ALS specialist pending December 2021   SLP Home Exercise Plan provided    Consulted and Agree with Plan of Care Patient;Family member/caregiver    Family Member Consulted wife           Patient will benefit from skilled therapeutic intervention in order to improve the following deficits and impairments:   Dysarthria and anarthria - Plan: SLP plan of care cert/re-cert  Cognitive communication deficit - Plan: SLP plan of care cert/re-cert    Problem List Patient Active Problem List   Diagnosis Date Noted  . Periodic limb movements of sleep 05/14/2015  . 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 Glucose intolerance (impaired glucose tolerance) 11/16/2014    Guy Payne ,MS, CCC-SLP  03/16/2020, 12:06 PM  [facility] [address] [place], [place], [address] Phone: [phone]   Fax:  [phone]   Name: Guy Payne MRN: [id number] Date of Birth: 06-30-1950

## 2020-03-30 DIAGNOSIS — R7301 Impaired fasting glucose: Secondary | ICD-10-CM | POA: Diagnosis not present

## 2020-03-30 DIAGNOSIS — F0280 Dementia in other diseases classified elsewhere without behavioral disturbance: Secondary | ICD-10-CM | POA: Diagnosis not present

## 2020-03-30 DIAGNOSIS — Z125 Encounter for screening for malignant neoplasm of prostate: Secondary | ICD-10-CM | POA: Diagnosis not present

## 2020-03-30 DIAGNOSIS — Z136 Encounter for screening for cardiovascular disorders: Secondary | ICD-10-CM | POA: Diagnosis not present

## 2020-03-30 DIAGNOSIS — Z79899 Other long term (current) drug therapy: Secondary | ICD-10-CM | POA: Diagnosis not present

## 2020-03-30 DIAGNOSIS — G309 Alzheimer's disease, unspecified: Secondary | ICD-10-CM | POA: Diagnosis not present

## 2020-04-01 ENCOUNTER — Other Ambulatory Visit: Payer: Self-pay

## 2020-04-01 ENCOUNTER — Ambulatory Visit: Payer: Medicare PPO | Attending: Family Medicine

## 2020-04-01 DIAGNOSIS — R471 Dysarthria and anarthria: Secondary | ICD-10-CM | POA: Insufficient documentation

## 2020-04-01 DIAGNOSIS — R41841 Cognitive communication deficit: Secondary | ICD-10-CM | POA: Diagnosis not present

## 2020-04-01 NOTE — Patient Instructions (Signed)
   [id number] - code for [employer] monthly subscription 15% off

## 2020-04-01 NOTE — Therapy (Signed)
[facility] [address] [place], [place], [address] Phone: [phone]   Fax:  [phone]  Speech Language Pathology Treatment  Patient Details  Name: Guy Payne MRN: [id number] Date of Birth: [birth year] Referring Provider (SLP): Naomie Dean, MD   Encounter Date: 04/01/2020   End of Session - 04/01/20 1453    Visit Number 13    Number of Visits 17    Date for SLP Re-Evaluation 04/16/20    Authorization Time Period pending    Authorization - Visit Number 11    Authorization - Number of Visits 15    SLP Start Time 7124    SLP Stop Time  0845    SLP Time Calculation (min) 41 min    Activity Tolerance Patient tolerated treatment well           Past Medical History:  Diagnosis Date  . Fall 2021  . MCI (mild cognitive impairment)   . Memory loss   . Prostate disorder    blockage  . Restless leg     Past Surgical History:  Procedure Laterality Date  . genital surgery     was not specific  . NO PAST SURGERIES    . prostate repair for blockage      There were no vitals filed for this visit.   Subjective Assessment - 04/01/20 0812    Subjective Pt brought in cough log. No real commonalities to pick out from it.    Patient is accompained by: Family member   Guy Payne- wife   Currently in Pain? No/denies                 ADULT SLP TREATMENT - 04/01/20 0815      General Information   Behavior/Cognition Alert;Cooperative;Pleasant mood;Distractible      Treatment Provided   Treatment provided Dysphagia      Dysphagia Treatment   Type of PO's observed Dysphagia 3 (soft);Thin liquids    Other treatment/comments Pt drank coffee throughout the session (21 sips) without overt s/sx oral dysphagia or aspiration. Pt stated to SLP that he was trying to take small sips. Pt's cough log does not indicate anything consistent to ascertain any type of food/liquid nor any time of day which causes pt more  difficulty than others. At this time SLP told pt to take small sips and bites (instead of nibbling like last sesion) which pt stated wife has to remind him of, occasionally.      Cognitive-Linquistic Treatment   Treatment focused on Cognition    Skilled Treatment Guy Payne cont to need to cue pt to not joke with strangers, or talk at innapropriate times (talking to people at an annual meeting). SLP reiterated to pt pragmatic rules of language make it uncomfortable for strangers to be joked with at times. SLP stressed to patient to allow Guy Payne to be his filter for what is and what is not appropriate in conversation with strangers or times to talk or be silent. Pt continues to work on [employer] for attention, at home. SLP provided a code for pt to receive 15% off monthly subscriptions.      Assessment / Recommendations / Plan   Plan Continue with current plan of care      Progression Toward Goals   Progression toward goals Progressing toward goals            SLP Education - 04/01/20 1452    Education Details wife will cont to need to remind pt  of social appropriateness of joking/not joking and of when to talk and when to stay silent, code for 15% off [employer], wife may need to remind pt of small sips/bites and not nibbling his food    Person(s) Educated Patient;Spouse    Methods Explanation;Handout    Comprehension Verbalized understanding            SLP Short Term Goals - 02/29/20 0014      SLP SHORT TERM GOAL #1   Title pt will perform HEP for dysarthria with rare min A from SLP x2 sesisons    Baseline 01-20-20    Status Partially Met      SLP SHORT TERM GOAL #2   Title pt will demonstrate compensations for dysarthria in 5 mintues simple conversation x2 sessions    Baseline 01-20-20    Status Achieved      SLP SHORT TERM GOAL #3   Title pt will report 4 attemtps at functional means to decr hypersalivation (lozenge, gum, alarms to swalllow)    Baseline 12-30-19     Status Partially Met            SLP Long Term Goals - 04/01/20 1455      SLP LONG TERM GOAL #1   Title pt will demonstrate 75% use of compensations for dysarthria in 10 mintues simple conversation x3 sessions    Baseline 02-03-20, 02-04-20    Status Achieved      SLP LONG TERM GOAL #2   Title pt will complete HEP for dysarthria/intelligiblity with rare min A from SLP for overarticulation ("tongue twisters") x3 sessions    Baseline 02-18-20, 02-26-19    Time 3    Period Weeks    Status On-going      SLP LONG TERM GOAL #3   Title pt will report better speech related QOL score than the score obtained during pt's first 2 sessions    Baseline wife-72, pt-60 reported 12-30-19 (session #2)    Time 3    Period Weeks    Status On-going      SLP LONG TERM GOAL #4   Title pt will verbalize a 1-2 sentence summary of what a voice bank is to SLP with modified indpendence    Time 3    Period Weeks    Status On-going      SLP LONG TERM GOAL #5   Title pt and/or wife will report pt and wife engaging in compensations for pt's attention and pragmatic challenges with strangers in 2 sessions    Baseline 04-01-20    Time 3    Period Weeks    Status On-going            Plan - 04/01/20 1454    Clinical Impression Statement Guy Payne presents today with 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 With liuquid POs pt without any overt s/sx of oral or phaygeal difficulty - see "other treatment" for more details. He also is dx'd with mesiotemporal atrophy and due to this demonstrates decr'd memory and attention skills - pt also demonstrates impulsivity which affects his pragmatics (social language). 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 Pt will benefit from 1-2 more skilled ST sessions to ensure speech intelligbility remains good- and continued  education and training for pt/wife how to manage pt's decr'd cogntive-pragmatic skills.    Speech  Therapy Frequency 2x / week    Duration 4 weeks   or 17 total sessions   Treatment/Interventions Environmental controls;Compensatory techniques;Functional tasks;Multimodal communcation approach;SLP instruction and feedback;Cueing hierarchy;Patient/family education;Cognitive reorganization;Internal/external aids    Potential to Achieve Goals Fair    Potential Considerations Medical prognosis   (possible) referral to ALS specialist pending December 2021   SLP Home Exercise Plan provided    Consulted and Agree with Plan of Care Patient;Family member/caregiver    Family Member Consulted wife           Patient will benefit from skilled therapeutic intervention in order to improve the following deficits and impairments:   Dysarthria and anarthria  Cognitive communication deficit    Problem List Patient Active Problem List   Diagnosis Date Noted  . Periodic limb movements of sleep 05/14/2015  . 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 Glucose intolerance (impaired glucose tolerance) 11/16/2014    Guy Payne ,MS, CCC-SLP  04/01/2020, 3:00 PM  Guy Payne, Guy Payne, Guy Payne Phone: [phone]   Fax:  [phone]   Name: NICOLI Payne MRN: [id number] Date of Birth: 1950/08/04

## 2020-04-13 ENCOUNTER — Other Ambulatory Visit: Payer: Self-pay

## 2020-04-13 ENCOUNTER — Ambulatory Visit: Payer: Medicare PPO

## 2020-04-13 DIAGNOSIS — Z0000 Encounter for general adult medical examination without abnormal findings: Secondary | ICD-10-CM | POA: Diagnosis not present

## 2020-04-13 DIAGNOSIS — R41841 Cognitive communication deficit: Secondary | ICD-10-CM

## 2020-04-13 DIAGNOSIS — Z1389 Encounter for screening for other disorder: Secondary | ICD-10-CM | POA: Diagnosis not present

## 2020-04-13 DIAGNOSIS — R471 Dysarthria and anarthria: Secondary | ICD-10-CM

## 2020-04-13 NOTE — Patient Instructions (Signed)
  Speech Exercises  Repeat 2 times, 2 times a day Use to practice overarticulation for your speech   Call the cat "Buttercup" A calendar of [place] Four floors to cover Yellow oil ointment Fellow lovers of felines Catastrophe in [place] Plump plumbers' plums The church's chimes chimed Telling time 'til eleven Five valve levers Keep the gate closed Go see that guy Fat cows give milk [employer] Gophers Fat frogs flip freely [employer] into bed Get that game to [employer] Thick thistles stick together Cinnamon aluminum linoleum Black bugs blood Lovely lemon linament Red leather, yellow leather  Big grocery buggy    Purple baby carriage [facility] Proper copper coffee pot Ripe purple cabbage Three free throws [employer] tackled  [employer] dipped the dessert  [employer] Buckle that [employer] Shirts shrink, shells shouldn't [employer] Take the tackle box File the flash message Give me five flapjacks Fundamental relatives Dye the pets purple Talking [place] time after time Dark chocolate chunks Political landscape of the kingdom Actuary genius We played yo-yos yesterday

## 2020-04-13 NOTE — Therapy (Signed)
[facility] [address] [place], [place], [address] Phone: [phone]   Fax:  [phone]  Speech Language Pathology Treatment/Discharge Summary  Patient Details  Name: Guy Payne MRN: [id number] Date of Birth: Nov 23, 1950 Referring Provider (SLP): Sarina Ill, MD   Encounter Date: 04/13/2020   End of Session - 04/13/20 1720    Visit Number 14    Number of Visits 17    Date for SLP Re-Evaluation 04/16/20    Authorization Time Period pending    Authorization - Visit Number 12    Authorization - Number of Visits 15    SLP Start Time 0805    SLP Stop Time  1443    SLP Time Calculation (min) 41 min    Activity Tolerance Patient tolerated treatment well           Past Medical History:  Diagnosis Date  . Fall 2021  . MCI (mild cognitive impairment)   . Memory loss   . Prostate disorder    blockage  . Restless leg     Past Surgical History:  Procedure Laterality Date  . genital surgery     was not specific  . NO PAST SURGERIES    . prostate repair for blockage      SPEECH THERAPY DISCHARGE SUMMARY  Visits from Start of Care: 14  Current functional level related to goals / functional outcomes: See goals below   Remaining deficits: Mild dysarthria corrected with speech compensations. Pt reports mild dysphagia chararcterized by coughing with meals. Coughing occurs less frequently than prior to initiation of ST. SLP highly recommended pt limit distractions during POs (inlcuding conversation), and use small bites and sips (no "nibbling"). No overt s/sx aspiration PNA or pulmonary difficulties for the course of ST. SLP stated that pt may require formal objective swallow assessment in the future, given his diagnosis.   Education / Equipment: Swallow assessment (objective) likely necessary at some point, voice banking/speech generating device, speech compensations, swallowing compensations, pragmatic  expectations for communication with strangers, education for wife how best to cue pt in situations where he likely to exhibit impulsivity due to decr'd pragmatics.   Plan: Patient agrees to discharge.  Patient goals were partially met. Patient is being discharged due to                                                     ?????meeting current rehab potential. Pt may require refresher course re: speech compensations in the future, and/or voice banking or SGD assistance.         There were no vitals filed for this visit.   Subjective Assessment - 04/13/20 0811    Subjective Pt brought chicken biscuit and coffee.    Patient is accompained by: Family member   wife   Currently in Pain? No/denies                 ADULT SLP TREATMENT - 04/13/20 0813      General Information   Behavior/Cognition Alert;Cooperative;Pleasant mood;Distractible      Treatment Provided   Treatment provided Dysphagia;Cognitive-Linquistic      Dysphagia Treatment   Temperature Spikes Noted No    Respiratory Status Room air    Treatment Methods Skilled observation;Compensation strategy training;Patient/caregiver education    Patient observed directly with PO's Yes  Type of PO's observed Thin liquids;Dysphagia 3 (soft)    Oral Phase Signs & Symptoms --   no overt s/sx   Pharyngeal Phase Signs & Symptoms Other (comment)   no overt s/sx   Amount of cueing Minimal    Other treatment/comments Pt ate chicken biscuit and drank coffee during session today. No overt s/sx oral or pharyngeal difficulties during this time. Wife states pt still coughs during meals but both Govani and [place] agree that frequency of coughing has decr'd since beginning of ST. SLP noted pt and Becky commented often during pt's POs - encouraging pt's conversation during POs. SLP reminded Jenny Reichmann and Jacqlyn Larsen to minimize distractions during POs to assist pt's focus on mealtime.      Cognitive-Linquistic Treatment   Treatment focused on Dysarthria     Skilled Treatment Pt walked into ST room and said, "I know I shouldn't talk to strangers but I still do." Jacqlyn Larsen tells SLP she cont to need to cue pt to not joke with strangers, or talk at innapropriate times. Pt cont to not heed Becky's suggestion. SLP reiterated to pt pragmatic rules of language make it uncomfortable for strangers to be joked with at times.SLP provided dysarthria practice phrases again for pt, and it was suggested pt cont with these phrases ~3 times a week to continue his incr'd habit of slowing and exaggerating his speech. Pt's wife and pt both state pt's eating and speaking are better than at beginning of ST.      Assessment / Recommendations / Plan   Plan Discharge SLP treatment due to (comment)   reached max rehab potential at this time     Progression Toward Goals   Progression toward goals --   d/c day= see goals             SLP Short Term Goals - 02/29/20 0014      SLP SHORT TERM GOAL #1   Title pt will perform HEP for dysarthria with rare min A from SLP x2 sesisons    Baseline 01-20-20    Status Partially Met      SLP SHORT TERM GOAL #2   Title pt will demonstrate compensations for dysarthria in 5 mintues simple conversation x2 sessions    Baseline 01-20-20    Status Achieved      SLP SHORT TERM GOAL #3   Title pt will report 4 attemtps at functional means to decr hypersalivation (lozenge, gum, alarms to swalllow)    Baseline 12-30-19    Status Partially Met            SLP Long Term Goals - 04/13/20 1724      SLP LONG TERM GOAL #1   Title pt will demonstrate 75% use of compensations for dysarthria in 10 mintues simple conversation x3 sessions    Baseline 02-03-20, 02-04-20    Status Achieved      SLP LONG TERM GOAL #2   Title pt will complete HEP for dysarthria/intelligiblity with rare min A from SLP for overarticulation ("tongue twisters") x3 sessions    Baseline 02-18-20, 02-26-19    Status Achieved      SLP LONG TERM GOAL #3   Title pt will  report better speech related QOL score than the score obtained during pt's first 2 sessions    Baseline wife-72, pt-60 reported 12-30-19 (session #2)    Status Partially Met   (subjectively, did not fill out QOL)     SLP LONG TERM GOAL #4   Title  pt will verbalize a 1-2 sentence summary of what a voice bank is to SLP with modified indpendence    Status Achieved      SLP LONG TERM GOAL #5   Title pt and/or wife will report pt and wife engaging in compensations for pt's attention and pragmatic challenges with strangers in 2 sessions    Baseline 04-01-20    Status Partially Met            Plan - 04/13/20 1721    Clinical Impression Statement Azarion Hove presents today with 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 With POs today pt without any overt s/sx of oral or pharyngeal difficulty - see "other treatment" for more details. He also is dx'd with mesiotemporal atrophy and due to this demonstrates decr'd memory and attention skills - pt also demonstrates impulsivity which continues to affect his pragmatics (social language). Wife reports continuing to need to cue/remind pt not to "joke" uncomfortably with strangers. 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 Pt has reached max rehab potential at this time but may benefit from a short course ST in the future to work more on compensatory strategies. Information provided today re: voice banking and SLP encouraged Jacqlyn Larsen to ask about this at May 12, 2020 appointment with ALS clinic.    Treatment/Interventions Environmental controls;Compensatory techniques;Functional tasks;Multimodal communcation approach;SLP instruction and feedback;Cueing hierarchy;Patient/family education;Cognitive reorganization;Internal/external aids    Potential to Achieve Goals Fair    Potential Considerations Medical prognosis   (possible)  referral to ALS specialist pending December 2021   SLP Home Exercise Plan provided    Consulted and Agree with Plan of Care Patient;Family member/caregiver    Family Member Consulted wife           Patient will benefit from skilled therapeutic intervention in order to improve the following deficits and impairments:   Dysarthria and anarthria  Cognitive communication deficit    Problem List Patient Active Problem List   Diagnosis Date Noted  . Periodic limb movements of sleep 05/14/2015  . 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 Glucose intolerance (impaired glucose tolerance) 11/16/2014    Cowen Pesqueira ,MS, CCC-SLP  04/13/2020, 5:25 PM  [facility] [address] [place], [place], [address] Phone: [phone]   Fax:  [phone]   Name: TREYVEN LAFAUCI MRN: [id number] Date of Birth: 1951/01/01

## 2020-04-17 DIAGNOSIS — R030 Elevated blood-pressure reading, without diagnosis of hypertension: Secondary | ICD-10-CM | POA: Diagnosis not present

## 2020-04-17 DIAGNOSIS — L719 Rosacea, unspecified: Secondary | ICD-10-CM | POA: Diagnosis not present

## 2020-04-17 DIAGNOSIS — Z803 Family history of malignant neoplasm of breast: Secondary | ICD-10-CM | POA: Diagnosis not present

## 2020-04-17 DIAGNOSIS — F0280 Dementia in other diseases classified elsewhere without behavioral disturbance: Secondary | ICD-10-CM | POA: Diagnosis not present

## 2020-04-17 DIAGNOSIS — G309 Alzheimer's disease, unspecified: Secondary | ICD-10-CM | POA: Diagnosis not present

## 2020-04-17 DIAGNOSIS — Z833 Family history of diabetes mellitus: Secondary | ICD-10-CM | POA: Diagnosis not present

## 2020-04-24 DIAGNOSIS — Z1211 Encounter for screening for malignant neoplasm of colon: Secondary | ICD-10-CM | POA: Diagnosis not present

## 2020-04-24 DIAGNOSIS — L719 Rosacea, unspecified: Secondary | ICD-10-CM | POA: Diagnosis not present

## 2020-04-24 DIAGNOSIS — L578 Other skin changes due to chronic exposure to nonionizing radiation: Secondary | ICD-10-CM | POA: Diagnosis not present

## 2020-04-24 DIAGNOSIS — L570 Actinic keratosis: Secondary | ICD-10-CM | POA: Diagnosis not present

## 2020-04-24 DIAGNOSIS — D225 Melanocytic nevi of trunk: Secondary | ICD-10-CM | POA: Diagnosis not present

## 2020-04-24 DIAGNOSIS — L821 Other seborrheic keratosis: Secondary | ICD-10-CM | POA: Diagnosis not present

## 2020-04-24 DIAGNOSIS — D1801 Hemangioma of skin and subcutaneous tissue: Secondary | ICD-10-CM | POA: Diagnosis not present

## 2020-04-24 DIAGNOSIS — L814 Other melanin hyperpigmentation: Secondary | ICD-10-CM | POA: Diagnosis not present

## 2020-06-23 DIAGNOSIS — H25013 Cortical age-related cataract, bilateral: Secondary | ICD-10-CM | POA: Diagnosis not present

## 2020-06-23 DIAGNOSIS — H2513 Age-related nuclear cataract, bilateral: Secondary | ICD-10-CM | POA: Diagnosis not present

## 2020-06-23 DIAGNOSIS — H2512 Age-related nuclear cataract, left eye: Secondary | ICD-10-CM | POA: Diagnosis not present

## 2020-06-23 DIAGNOSIS — H25043 Posterior subcapsular polar age-related cataract, bilateral: Secondary | ICD-10-CM | POA: Diagnosis not present

## 2020-06-23 DIAGNOSIS — H18413 Arcus senilis, bilateral: Secondary | ICD-10-CM | POA: Diagnosis not present

## 2020-07-22 DIAGNOSIS — G1221 Amyotrophic lateral sclerosis: Secondary | ICD-10-CM | POA: Diagnosis not present

## 2020-07-22 DIAGNOSIS — F0280 Dementia in other diseases classified elsewhere without behavioral disturbance: Secondary | ICD-10-CM | POA: Diagnosis not present

## 2020-07-22 DIAGNOSIS — G3109 Other frontotemporal dementia: Secondary | ICD-10-CM | POA: Diagnosis not present

## 2020-07-22 DIAGNOSIS — G1220 Motor neuron disease, unspecified: Secondary | ICD-10-CM | POA: Diagnosis not present

## 2020-07-22 DIAGNOSIS — R1319 Other dysphagia: Secondary | ICD-10-CM | POA: Diagnosis not present

## 2020-07-22 DIAGNOSIS — K117 Disturbances of salivary secretion: Secondary | ICD-10-CM | POA: Diagnosis not present

## 2020-07-22 DIAGNOSIS — R471 Dysarthria and anarthria: Secondary | ICD-10-CM | POA: Diagnosis not present

## 2020-08-12 DIAGNOSIS — N138 Other obstructive and reflux uropathy: Secondary | ICD-10-CM | POA: Diagnosis not present

## 2020-08-12 DIAGNOSIS — R3915 Urgency of urination: Secondary | ICD-10-CM | POA: Diagnosis not present

## 2020-08-12 DIAGNOSIS — N401 Enlarged prostate with lower urinary tract symptoms: Secondary | ICD-10-CM | POA: Diagnosis not present

## 2020-08-12 DIAGNOSIS — R350 Frequency of micturition: Secondary | ICD-10-CM | POA: Diagnosis not present

## 2020-09-02 ENCOUNTER — Telehealth: Payer: Self-pay | Admitting: *Deleted

## 2020-09-02 ENCOUNTER — Ambulatory Visit: Payer: Medicare PPO | Admitting: Neurology

## 2020-09-02 ENCOUNTER — Telehealth: Payer: Self-pay | Admitting: Neurology

## 2020-09-02 NOTE — Telephone Encounter (Signed)
Cancelled pt appt today at 1300 per Dr. Lucia Gaskins, pt followed at [facility] for ALS. LMVM for wife.

## 2020-09-02 NOTE — Telephone Encounter (Signed)
I called and LMVM for pt wife, Kriste Basque,  cancelled the appt today at 1300 with Dr. Lucia Gaskins (per Lucia Gaskins) as he is followed for ALS at [facility].  Wife to callback if needed.

## 2020-09-02 NOTE — Telephone Encounter (Signed)
Patient diagnosed with bulbar-onset ALS and follows with [facility]. He will continue to follow with [facility]. Please cancel appointment. Block the spot please.

## 2020-09-07 DIAGNOSIS — H2513 Age-related nuclear cataract, bilateral: Secondary | ICD-10-CM | POA: Diagnosis not present

## 2020-09-07 DIAGNOSIS — H2512 Age-related nuclear cataract, left eye: Secondary | ICD-10-CM | POA: Diagnosis not present

## 2020-09-07 DIAGNOSIS — H52202 Unspecified astigmatism, left eye: Secondary | ICD-10-CM | POA: Diagnosis not present

## 2020-09-08 DIAGNOSIS — H2511 Age-related nuclear cataract, right eye: Secondary | ICD-10-CM | POA: Diagnosis not present

## 2020-09-14 DIAGNOSIS — Z800 Family history of malignant neoplasm of digestive organs: Secondary | ICD-10-CM | POA: Diagnosis not present

## 2020-09-14 DIAGNOSIS — Z86010 Personal history of colonic polyps: Secondary | ICD-10-CM | POA: Diagnosis not present

## 2020-09-21 DIAGNOSIS — H2513 Age-related nuclear cataract, bilateral: Secondary | ICD-10-CM | POA: Diagnosis not present

## 2020-09-21 DIAGNOSIS — H2511 Age-related nuclear cataract, right eye: Secondary | ICD-10-CM | POA: Diagnosis not present

## 2020-09-21 DIAGNOSIS — H52201 Unspecified astigmatism, right eye: Secondary | ICD-10-CM | POA: Diagnosis not present

## 2020-11-22 ENCOUNTER — Other Ambulatory Visit: Payer: Self-pay | Admitting: Neurology

## 2020-11-25 DIAGNOSIS — R1310 Dysphagia, unspecified: Secondary | ICD-10-CM | POA: Diagnosis not present

## 2020-11-25 DIAGNOSIS — G3109 Other frontotemporal dementia: Secondary | ICD-10-CM | POA: Diagnosis not present

## 2020-11-25 DIAGNOSIS — R471 Dysarthria and anarthria: Secondary | ICD-10-CM | POA: Diagnosis not present

## 2020-11-25 DIAGNOSIS — G1220 Motor neuron disease, unspecified: Secondary | ICD-10-CM | POA: Diagnosis not present

## 2020-11-25 DIAGNOSIS — G1221 Amyotrophic lateral sclerosis: Secondary | ICD-10-CM | POA: Diagnosis not present

## 2020-11-25 DIAGNOSIS — Z79899 Other long term (current) drug therapy: Secondary | ICD-10-CM | POA: Diagnosis not present

## 2020-11-25 DIAGNOSIS — F0280 Dementia in other diseases classified elsewhere without behavioral disturbance: Secondary | ICD-10-CM | POA: Diagnosis not present

## 2020-11-25 DIAGNOSIS — K117 Disturbances of salivary secretion: Secondary | ICD-10-CM | POA: Diagnosis not present

## 2020-11-25 DIAGNOSIS — G1222 Progressive bulbar palsy: Secondary | ICD-10-CM | POA: Diagnosis not present

## 2021-01-04 ENCOUNTER — Other Ambulatory Visit: Payer: Self-pay | Admitting: Neurology

## 2021-02-06 IMAGING — RF DG SWALLOWING FUNCTION
10 series · 19 of 24 positions shown · non-contrast
Comparison: None

CLINICAL DATA: Dysphagia, pooling of saliva

EXAM:
MODIFIED BARIUM SWALLOW
TECHNIQUE: Different consistencies of barium were administered orally to the
patient by the Speech Pathologist. Imaging of the pharynx was
performed in the lateral projection. The radiologist was present in
the fluoroscopy room for this study, providing personal supervision.
FLUOROSCOPY TIME:  Fluoroscopy Time:  1 minutes 30 seconds
Radiation Exposure Index (if provided by the fluoroscopic device):
3.8 mGy
Number of Acquired Spot Images: 0

[Series 1: cp_standard · 0.34mm/px · 2 of 129 frames shown (1 of 10)]
[frame 20/129]
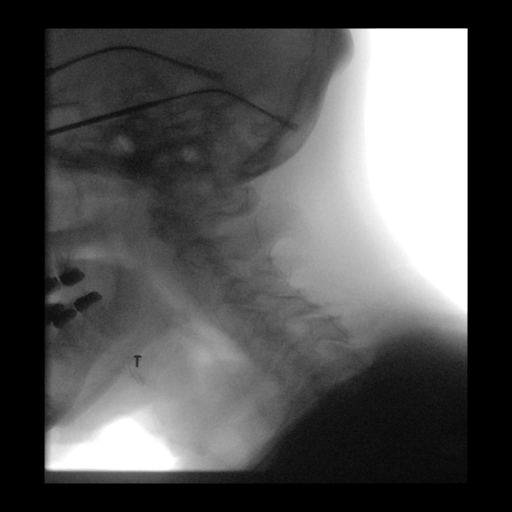
[frame 110/129]
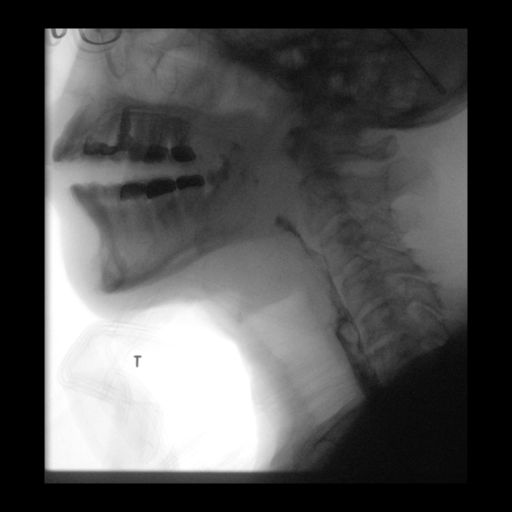

[Series 2: cp_standard · 0.34mm/px · 2 of 281 frames shown (2 of 10)]
[frame 115/281]
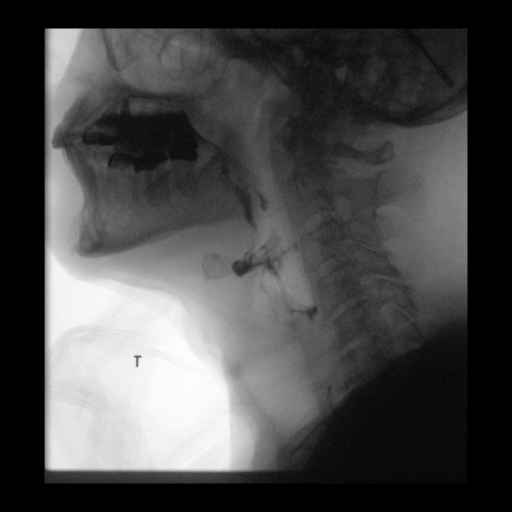
[frame 239/281]
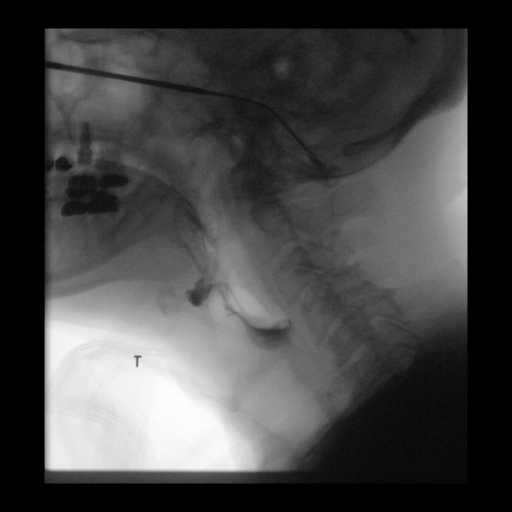

[Series 3: cp_standard · 0.34mm/px · 2 of 98 frames shown (3 of 10)]
[frame 13/98]
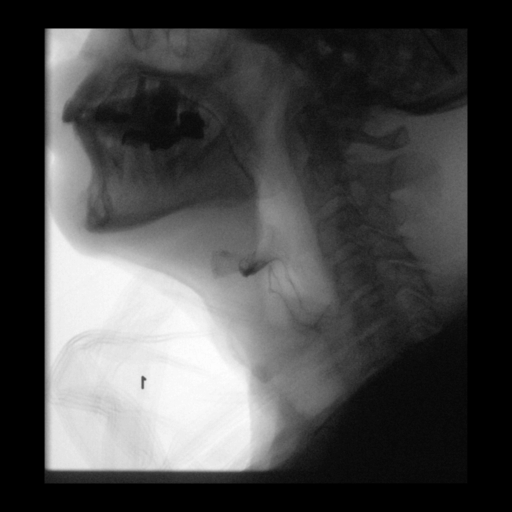
[frame 50/98]
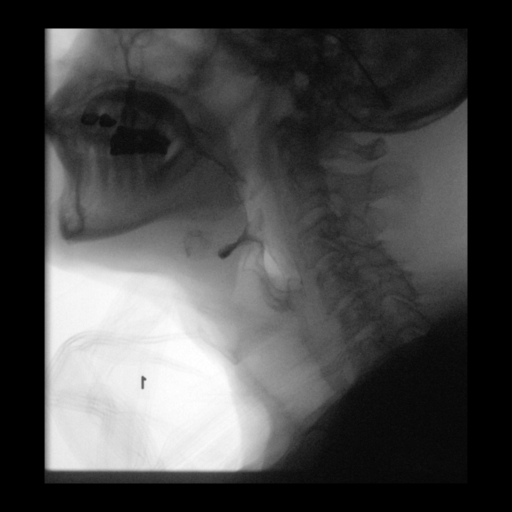

[Series 4: cp_standard · 0.34mm/px · 2 of 240 frames shown (4 of 10)]
[frame 121/240]
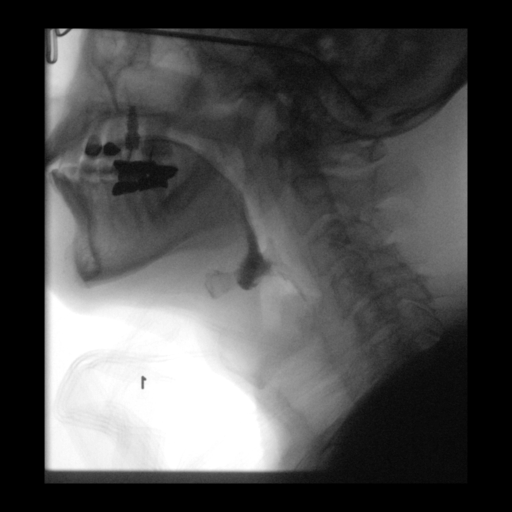
[frame 205/240]
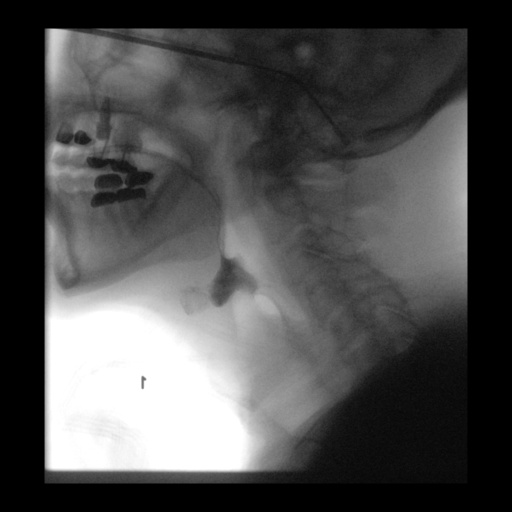

[Series 5: cp_standard · 0.34mm/px · 1 of 45 frames shown (5 of 10)]
[frame 7/45]
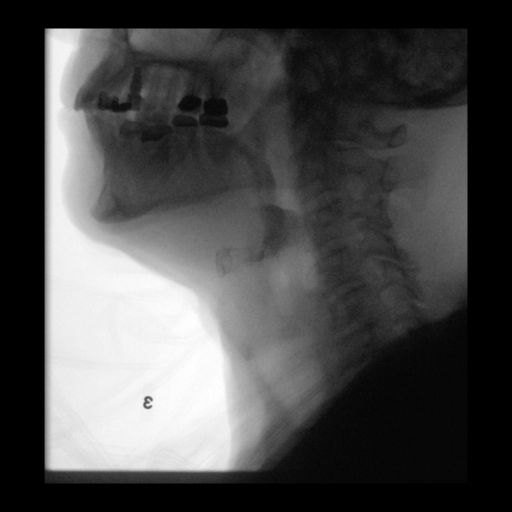

[Series 6: cp_standard · 0.34mm/px · 2 of 102 frames shown (6 of 10)]
[frame 16/102]
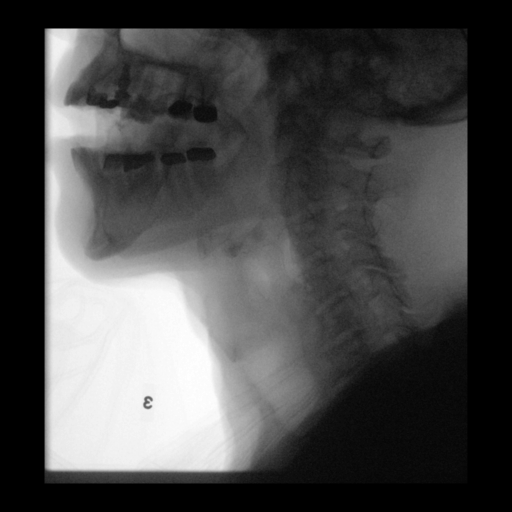
[frame 64/102]
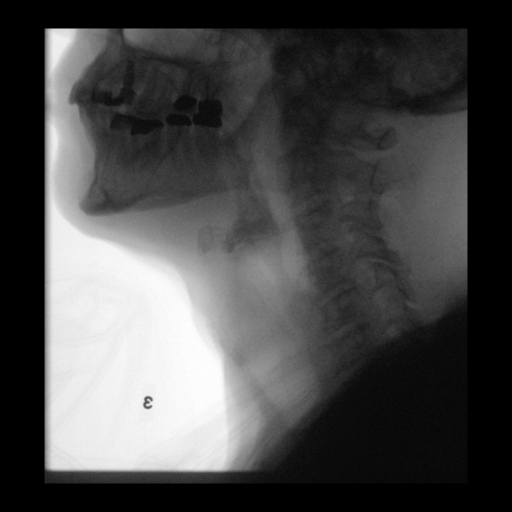

[Series 7: cp_standard · 0.34mm/px · 2 of 79 frames shown (7 of 10)]
[frame 12/79]
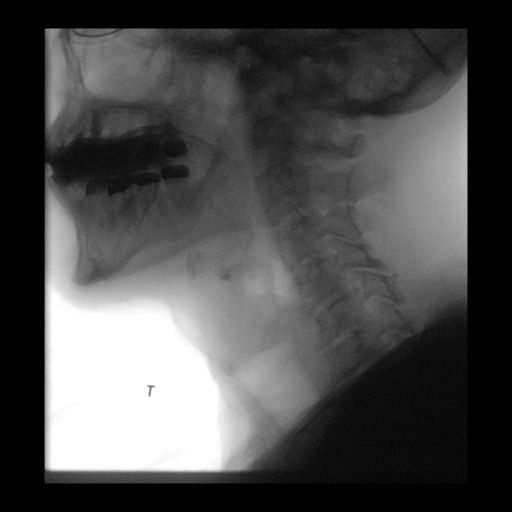
[frame 18/79]
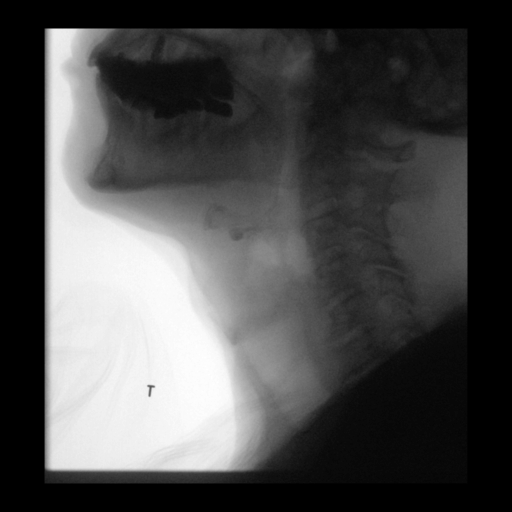

[Series 8: cp_standard · 0.34mm/px · 2 of 125 frames shown (8 of 10)]
[frame 48/125]
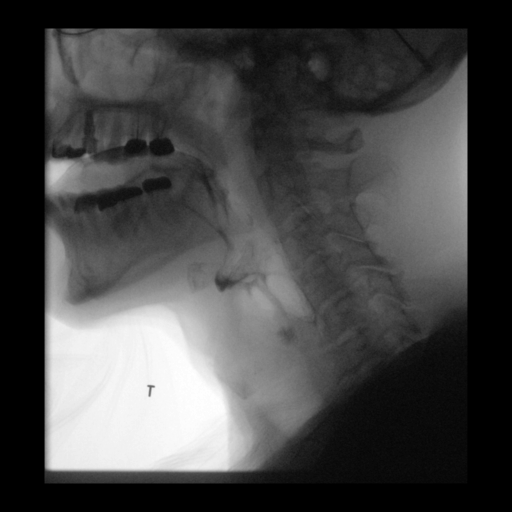
[frame 107/125]
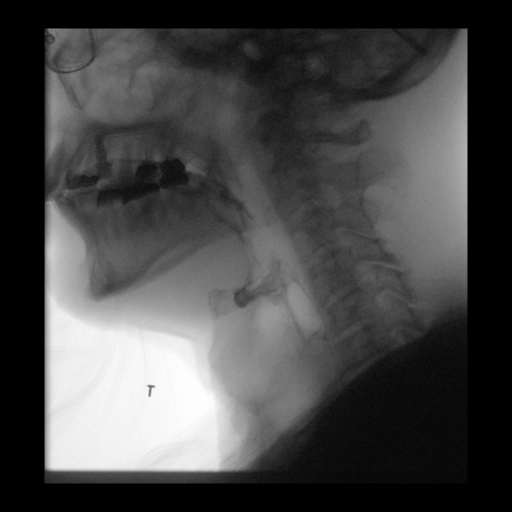

[Series 9: cp_standard · 0.34mm/px · 2 of 87 frames shown (9 of 10)]
[frame 14/87]
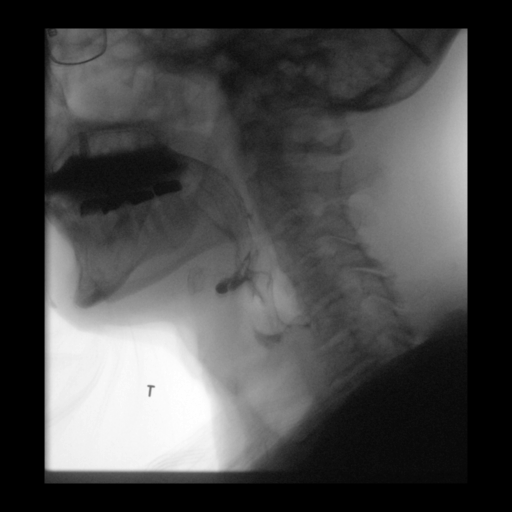
[frame 44/87]
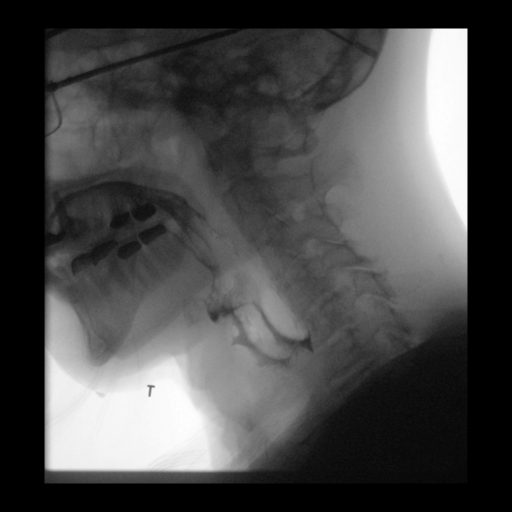

[Series 10: cp_standard · 0.34mm/px · 2 of 55 frames shown (10 of 10)]
[frame 28/55]
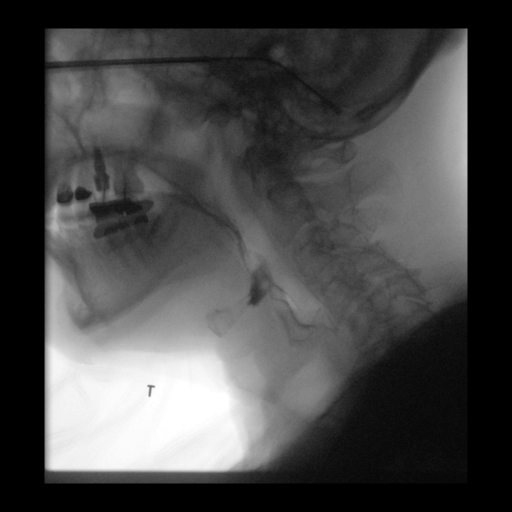
[frame 55/55]
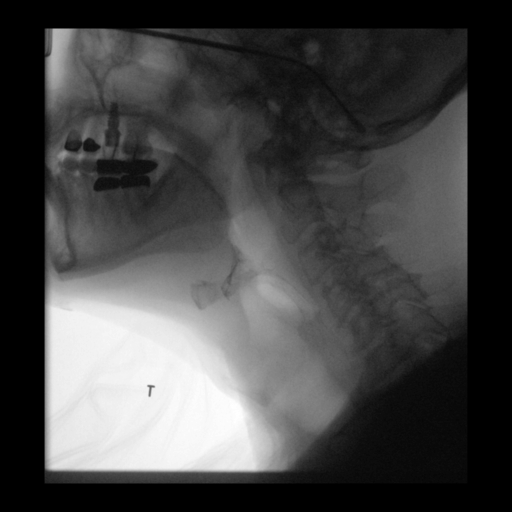

[19 of 24 positions shown; findings below may reference images not displayed]

FINDINGS: Degenerative changes are noted in the cervical spine. Swallowing was
performed across consistencies ranging from thin barium to barium
with cracker. No signs of penetration or aspiration with some mild
post swallow residual that cleared with subsequent swallows.
IMPRESSION: No sign of penetration or aspiration.

Please refer to the Speech Pathologists report for complete details
and recommendations.

## 2021-03-14 ENCOUNTER — Other Ambulatory Visit: Payer: Self-pay | Admitting: Neurology

## 2021-03-24 DIAGNOSIS — G1221 Amyotrophic lateral sclerosis: Secondary | ICD-10-CM | POA: Diagnosis not present

## 2021-03-24 DIAGNOSIS — R0989 Other specified symptoms and signs involving the circulatory and respiratory systems: Secondary | ICD-10-CM | POA: Diagnosis not present

## 2021-03-24 DIAGNOSIS — F0280 Dementia in other diseases classified elsewhere without behavioral disturbance: Secondary | ICD-10-CM | POA: Diagnosis not present

## 2021-03-24 DIAGNOSIS — R471 Dysarthria and anarthria: Secondary | ICD-10-CM | POA: Diagnosis not present

## 2021-03-24 DIAGNOSIS — F02818 Dementia in other diseases classified elsewhere, unspecified severity, with other behavioral disturbance: Secondary | ICD-10-CM | POA: Diagnosis not present

## 2021-03-24 DIAGNOSIS — R1310 Dysphagia, unspecified: Secondary | ICD-10-CM | POA: Diagnosis not present

## 2021-03-24 DIAGNOSIS — G1220 Motor neuron disease, unspecified: Secondary | ICD-10-CM | POA: Diagnosis not present

## 2021-03-24 DIAGNOSIS — G3109 Other frontotemporal dementia: Secondary | ICD-10-CM | POA: Diagnosis not present

## 2021-03-24 DIAGNOSIS — Z79899 Other long term (current) drug therapy: Secondary | ICD-10-CM | POA: Diagnosis not present

## 2021-03-24 DIAGNOSIS — F03918 Unspecified dementia, unspecified severity, with other behavioral disturbance: Secondary | ICD-10-CM | POA: Diagnosis not present

## 2021-03-25 DIAGNOSIS — Z833 Family history of diabetes mellitus: Secondary | ICD-10-CM | POA: Diagnosis not present

## 2021-03-25 DIAGNOSIS — F03A0 Unspecified dementia, mild, without behavioral disturbance, psychotic disturbance, mood disturbance, and anxiety: Secondary | ICD-10-CM | POA: Diagnosis not present

## 2021-03-25 DIAGNOSIS — Z803 Family history of malignant neoplasm of breast: Secondary | ICD-10-CM | POA: Diagnosis not present

## 2021-03-25 DIAGNOSIS — N529 Male erectile dysfunction, unspecified: Secondary | ICD-10-CM | POA: Diagnosis not present

## 2021-04-26 DIAGNOSIS — N400 Enlarged prostate without lower urinary tract symptoms: Secondary | ICD-10-CM | POA: Diagnosis not present

## 2021-04-26 DIAGNOSIS — Z1211 Encounter for screening for malignant neoplasm of colon: Secondary | ICD-10-CM | POA: Diagnosis not present

## 2021-04-26 DIAGNOSIS — Z0000 Encounter for general adult medical examination without abnormal findings: Secondary | ICD-10-CM | POA: Diagnosis not present

## 2021-04-26 DIAGNOSIS — R7301 Impaired fasting glucose: Secondary | ICD-10-CM | POA: Diagnosis not present

## 2021-04-26 DIAGNOSIS — Z1389 Encounter for screening for other disorder: Secondary | ICD-10-CM | POA: Diagnosis not present

## 2021-04-26 DIAGNOSIS — N529 Male erectile dysfunction, unspecified: Secondary | ICD-10-CM | POA: Diagnosis not present

## 2021-04-26 DIAGNOSIS — N1831 Chronic kidney disease, stage 3a: Secondary | ICD-10-CM | POA: Diagnosis not present

## 2021-04-26 DIAGNOSIS — E782 Mixed hyperlipidemia: Secondary | ICD-10-CM | POA: Diagnosis not present

## 2021-04-26 DIAGNOSIS — Z125 Encounter for screening for malignant neoplasm of prostate: Secondary | ICD-10-CM | POA: Diagnosis not present

## 2021-04-28 DIAGNOSIS — R4701 Aphasia: Secondary | ICD-10-CM | POA: Diagnosis not present

## 2021-04-28 DIAGNOSIS — Z1211 Encounter for screening for malignant neoplasm of colon: Secondary | ICD-10-CM | POA: Diagnosis not present

## 2021-04-28 DIAGNOSIS — Z0000 Encounter for general adult medical examination without abnormal findings: Secondary | ICD-10-CM | POA: Diagnosis not present

## 2021-04-28 DIAGNOSIS — N1831 Chronic kidney disease, stage 3a: Secondary | ICD-10-CM | POA: Diagnosis not present

## 2021-04-28 DIAGNOSIS — E782 Mixed hyperlipidemia: Secondary | ICD-10-CM | POA: Diagnosis not present

## 2021-04-28 DIAGNOSIS — G1221 Amyotrophic lateral sclerosis: Secondary | ICD-10-CM | POA: Diagnosis not present

## 2021-04-28 DIAGNOSIS — G309 Alzheimer's disease, unspecified: Secondary | ICD-10-CM | POA: Diagnosis not present

## 2021-05-19 DIAGNOSIS — D485 Neoplasm of uncertain behavior of skin: Secondary | ICD-10-CM | POA: Diagnosis not present

## 2021-05-19 DIAGNOSIS — L570 Actinic keratosis: Secondary | ICD-10-CM | POA: Diagnosis not present

## 2021-05-19 DIAGNOSIS — L578 Other skin changes due to chronic exposure to nonionizing radiation: Secondary | ICD-10-CM | POA: Diagnosis not present

## 2021-05-19 DIAGNOSIS — D1801 Hemangioma of skin and subcutaneous tissue: Secondary | ICD-10-CM | POA: Diagnosis not present

## 2021-05-19 DIAGNOSIS — L821 Other seborrheic keratosis: Secondary | ICD-10-CM | POA: Diagnosis not present

## 2021-05-19 DIAGNOSIS — L719 Rosacea, unspecified: Secondary | ICD-10-CM | POA: Diagnosis not present

## 2021-07-28 DIAGNOSIS — Z79899 Other long term (current) drug therapy: Secondary | ICD-10-CM | POA: Diagnosis not present

## 2021-07-28 DIAGNOSIS — G1221 Amyotrophic lateral sclerosis: Secondary | ICD-10-CM | POA: Diagnosis not present

## 2021-07-28 DIAGNOSIS — F919 Conduct disorder, unspecified: Secondary | ICD-10-CM | POA: Diagnosis not present

## 2021-07-28 DIAGNOSIS — R1310 Dysphagia, unspecified: Secondary | ICD-10-CM | POA: Diagnosis not present

## 2021-07-28 DIAGNOSIS — R471 Dysarthria and anarthria: Secondary | ICD-10-CM | POA: Diagnosis not present

## 2021-07-28 DIAGNOSIS — E440 Moderate protein-calorie malnutrition: Secondary | ICD-10-CM | POA: Diagnosis not present

## 2021-07-28 DIAGNOSIS — Z6821 Body mass index (BMI) 21.0-21.9, adult: Secondary | ICD-10-CM | POA: Diagnosis not present

## 2021-07-28 DIAGNOSIS — F03918 Unspecified dementia, unspecified severity, with other behavioral disturbance: Secondary | ICD-10-CM | POA: Diagnosis not present

## 2021-11-10 DIAGNOSIS — H524 Presbyopia: Secondary | ICD-10-CM | POA: Diagnosis not present

## 2021-11-10 DIAGNOSIS — H35033 Hypertensive retinopathy, bilateral: Secondary | ICD-10-CM | POA: Diagnosis not present

## 2021-11-10 DIAGNOSIS — H52223 Regular astigmatism, bilateral: Secondary | ICD-10-CM | POA: Diagnosis not present

## 2021-11-10 DIAGNOSIS — Z961 Presence of intraocular lens: Secondary | ICD-10-CM | POA: Diagnosis not present

## 2021-12-01 DIAGNOSIS — K117 Disturbances of salivary secretion: Secondary | ICD-10-CM | POA: Diagnosis not present

## 2021-12-01 DIAGNOSIS — G1220 Motor neuron disease, unspecified: Secondary | ICD-10-CM | POA: Diagnosis not present

## 2021-12-01 DIAGNOSIS — F0280 Dementia in other diseases classified elsewhere without behavioral disturbance: Secondary | ICD-10-CM | POA: Diagnosis not present

## 2021-12-01 DIAGNOSIS — R1310 Dysphagia, unspecified: Secondary | ICD-10-CM | POA: Diagnosis not present

## 2021-12-01 DIAGNOSIS — Z79899 Other long term (current) drug therapy: Secondary | ICD-10-CM | POA: Diagnosis not present

## 2021-12-01 DIAGNOSIS — F0390 Unspecified dementia without behavioral disturbance: Secondary | ICD-10-CM | POA: Diagnosis not present

## 2021-12-01 DIAGNOSIS — G3109 Other frontotemporal dementia: Secondary | ICD-10-CM | POA: Diagnosis not present

## 2022-03-11 DIAGNOSIS — G1221 Amyotrophic lateral sclerosis: Secondary | ICD-10-CM | POA: Diagnosis not present

## 2022-04-11 DIAGNOSIS — G1221 Amyotrophic lateral sclerosis: Secondary | ICD-10-CM | POA: Diagnosis not present

## 2022-04-27 DIAGNOSIS — G1221 Amyotrophic lateral sclerosis: Secondary | ICD-10-CM | POA: Diagnosis not present

## 2022-05-02 DIAGNOSIS — Z6822 Body mass index (BMI) 22.0-22.9, adult: Secondary | ICD-10-CM | POA: Diagnosis not present

## 2022-05-02 DIAGNOSIS — Z1211 Encounter for screening for malignant neoplasm of colon: Secondary | ICD-10-CM | POA: Diagnosis not present

## 2022-05-02 DIAGNOSIS — Z0000 Encounter for general adult medical examination without abnormal findings: Secondary | ICD-10-CM | POA: Diagnosis not present

## 2022-05-02 DIAGNOSIS — Z1389 Encounter for screening for other disorder: Secondary | ICD-10-CM | POA: Diagnosis not present

## 2022-05-03 ENCOUNTER — Other Ambulatory Visit: Payer: Medicare PPO

## 2022-05-03 DIAGNOSIS — Z515 Encounter for palliative care: Secondary | ICD-10-CM

## 2022-05-03 NOTE — Progress Notes (Signed)
COMMUNITY PALLIATIVE CARE SW NOTE  PATIENT NAME: Guy Payne DOB: [birth year] MRN: [id number]  PRIMARY CARE PROVIDER: Hulan Fess, MD  RESPONSIBLE PARTY:  Acct ID - Guarantor Home Phone Work Phone Relationship Acct Type  [id number] Prescilla [phone]  Self P/F     [place], [place], [place] [address]   Initial Palliative Telephonic Encounter/Clinical Social Work   [employer] SW completed an initial palliative care encounter with patient's wife. SW provided education regarding the palliative care services, role in patient's care, visit frequency and contact information as his wife is open to palliative care services. She provided a status update on patient.  She report that patient is aspirating a lot. He is on a soft diet and regular liquids. He does not eat alone Patient has difficulty speaking. He is no longer recognizes some family and friends. He still ambulates, but his gait is unsteady. He had a fall recently.  He can't take his own shower and dress himself.  As his wife anticipates his decline they are planning family trips to [place] (3/22-3/29/24)  and [place] cruise for May 17 th. She is also having renovations done to her home to accommodate patient decline.  SW scheduled a follow-up visit for 06/01/22 @ 10 am.    Social History   Tobacco Use   Smoking status: Never   Smokeless tobacco: Never  Substance Use Topics   Alcohol use: Not Currently    Alcohol/week: 0.0 standard drinks of alcohol    Comment: 2 days per week. No more than 2 glasses    CODE STATUS: Full Code ADVANCED DIRECTIVES: No MOST FORM COMPLETE:  No HOSPICE EDUCATION PROVIDED: No  Duration of encounter and documentation: 30 minutes  Brier Reid, LCSW

## 2022-05-04 DIAGNOSIS — G1221 Amyotrophic lateral sclerosis: Secondary | ICD-10-CM | POA: Diagnosis not present

## 2022-05-04 DIAGNOSIS — Z1211 Encounter for screening for malignant neoplasm of colon: Secondary | ICD-10-CM | POA: Diagnosis not present

## 2022-05-10 DIAGNOSIS — G1221 Amyotrophic lateral sclerosis: Secondary | ICD-10-CM | POA: Diagnosis not present

## 2022-05-12 DIAGNOSIS — G1221 Amyotrophic lateral sclerosis: Secondary | ICD-10-CM | POA: Diagnosis not present

## 2022-05-26 DIAGNOSIS — E7800 Pure hypercholesterolemia, unspecified: Secondary | ICD-10-CM | POA: Diagnosis not present

## 2022-05-26 DIAGNOSIS — Z6822 Body mass index (BMI) 22.0-22.9, adult: Secondary | ICD-10-CM | POA: Diagnosis not present

## 2022-05-26 DIAGNOSIS — G1221 Amyotrophic lateral sclerosis: Secondary | ICD-10-CM | POA: Diagnosis not present

## 2022-05-26 DIAGNOSIS — G309 Alzheimer's disease, unspecified: Secondary | ICD-10-CM | POA: Diagnosis not present

## 2022-05-26 DIAGNOSIS — R296 Repeated falls: Secondary | ICD-10-CM | POA: Diagnosis not present

## 2022-05-26 DIAGNOSIS — R7301 Impaired fasting glucose: Secondary | ICD-10-CM | POA: Diagnosis not present

## 2022-05-26 DIAGNOSIS — F0280 Dementia in other diseases classified elsewhere without behavioral disturbance: Secondary | ICD-10-CM | POA: Diagnosis not present

## 2022-05-26 DIAGNOSIS — Z125 Encounter for screening for malignant neoplasm of prostate: Secondary | ICD-10-CM | POA: Diagnosis not present

## 2022-05-27 DIAGNOSIS — G1221 Amyotrophic lateral sclerosis: Secondary | ICD-10-CM | POA: Diagnosis not present

## 2022-05-27 DIAGNOSIS — M791 Myalgia, unspecified site: Secondary | ICD-10-CM | POA: Diagnosis not present

## 2022-05-27 DIAGNOSIS — M6289 Other specified disorders of muscle: Secondary | ICD-10-CM | POA: Diagnosis not present

## 2022-05-31 DIAGNOSIS — R1312 Dysphagia, oropharyngeal phase: Secondary | ICD-10-CM | POA: Diagnosis not present

## 2022-05-31 DIAGNOSIS — G1221 Amyotrophic lateral sclerosis: Secondary | ICD-10-CM | POA: Diagnosis not present

## 2022-06-01 ENCOUNTER — Other Ambulatory Visit: Payer: Medicare PPO | Admitting: Hospice

## 2022-06-01 ENCOUNTER — Other Ambulatory Visit: Payer: Medicare PPO

## 2022-06-01 VITALS — BP 122/68 | HR 51 | Temp 98.1°F

## 2022-06-01 DIAGNOSIS — L578 Other skin changes due to chronic exposure to nonionizing radiation: Secondary | ICD-10-CM | POA: Diagnosis not present

## 2022-06-01 DIAGNOSIS — L821 Other seborrheic keratosis: Secondary | ICD-10-CM | POA: Diagnosis not present

## 2022-06-01 DIAGNOSIS — D225 Melanocytic nevi of trunk: Secondary | ICD-10-CM | POA: Diagnosis not present

## 2022-06-01 DIAGNOSIS — L719 Rosacea, unspecified: Secondary | ICD-10-CM | POA: Diagnosis not present

## 2022-06-01 DIAGNOSIS — Z515 Encounter for palliative care: Secondary | ICD-10-CM

## 2022-06-01 NOTE — Progress Notes (Signed)
COMMUNITY PALLIATIVE CARE SW NOTE  PATIENT NAME: Guy Payne DOB: 22-Jul-1950 MRN: [id number]  PRIMARY CARE PROVIDER: Catha Gosselin, MD  RESPONSIBLE PARTY:  Acct ID - Guarantor Home Phone Work Phone Relationship Acct Type  [id number] [name]* [phone]  Self P/F     [address], [place], [place] [address]   Palliative Care Follow-Up/Clinical Social Work   SW and Nurse-D. Clementeen Graham completed a visit with patient at his home. His wife-[name] was present with him. Patient opened the door.  He is non-verbal, but he is able to write his thoughts/responses. Patient drools constantly. He denied pain. He report that he does have discomfort with his throat when he eats. He will choke on his saliva. Patient has a referral for swallowing through [facility] neurology. Patient had two falls last month.   He has difficulty and strain but now put Metamucil daily. He has a BM every 1-2 days. Patient is on a soft food diet and eats 6-7 meals per day. His current weight is approximately 145 lbs. Patient receives medical massage therapy weekly. He goes to ALS clinic at [facility] every 3-4 months.  Patient ambulates independently.  Patient is independent for all personal care. Patient's behavior is spontaneous, jovial and can be aggressive  Patient was born in [place]. 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 He has been married 48 years. They have one son and four grandchildren. Patient is of Christian faith. He is in a men's Bible study group every Saturday.  Patient/wife have great support through their son, neighbors. His wife is able to do self-care, by line-dancing every Monday. He walks daily with the neighbors. [name] requested that patient be a DNR. The NP added to the visit virtually for advance care planning-DNR left in the home.[name] serve as his POA/HCPOA.   Goal is to get better and to remain in his home.    Social History    Tobacco Use   Smoking status: Never   Smokeless tobacco: Never  Substance Use Topics   Alcohol use: Not Currently    Alcohol/week: 0.0 standard drinks of alcohol    Comment: 2 days per week. No more than 2 glasses    CODE STATUS: DNR ADVANCED DIRECTIVES: NO MOST FORM COMPLETE:  No HOSPICE EDUCATION PROVIDED: No  Duration of visit and documentation: 60 minutes.  [address] [place], [place]

## 2022-06-01 NOTE — Progress Notes (Signed)
PATIENT NAME: Guy Payne DOB: 01-27-1951 MRN: [id number]  PRIMARY CARE PROVIDER: Catha Gosselin, MD  RESPONSIBLE PARTY:  Acct ID - Guarantor Home Phone Work Phone Relationship Acct Type  [id number] Guy Payne* [phone]  Self P/F     [address], [place], [place] [address]     Palliative Care Initial Encounter Note   Completed home visit with Guy Payne, SW. Wife Guy Payne also present.     HISTORY OF PRESENT ILLNESS: ALS, Dementia   TODAY'S VISIT:  Respiratory: no SOB at this time  Cardiac: no edema  Cognitive: alert; answers all questions appropriately   Appetite: eats 6-7 small meals; soft diet; doesn't eat nuts; can eat Hawaiian bread if cut up; can enjoy the flavors of the foods he eats; can't suck thru a straw; uses a spoon; wears clothing protectors  GI/GU: every 1-2 days; takes Metamucil; no urinary issues   Mobility: independent; no assistive devices needed  ADLs: independent; cooking, cleaning, driving done by wife   Sleeping Pattern: lately wakes up at night  Pain: denies pain at this time; pt reports he has some pain while eating  Wt: now weighs approx. 145 lbs  Palliative Care/ Hospice: LPN explained role and purpose of palliative care including visit frequency. Also discussed benefits of hospice care as well as the differences between the two with patient.   Goals of Care: To stay in the home with his wife    Next Appt Scheduled For: 05/31/22 at 8:30am  CODE STATUS: DNR ADVANCED DIRECTIVES: N MOST FORM: N PPS: 90%   PHYSICAL EXAM:   VITALS: Today's Vitals   06/01/22 1014  BP: 122/68  Pulse: (!) 51  Temp: 98.1 F (36.7 C)  TempSrc: Temporal  SpO2: 96%  PainSc: 0-No pain    LUNGS: clear to auscultation  CARDIAC: Cor RRR EXTREMITIES: AROM x4; no edema SKIN: Skin color, texture, turgor normal. No rashes or lesions  NEURO: positive for memory problems and speech problems   Will be having medical massage  therapy    Guy Payne Clementeen Graham, LPN

## 2022-06-01 NOTE — Progress Notes (Signed)
    Therapist, nutritional Palliative Care Consult Note Telephone: [phone]  Fax: [phone]    Date of encounter: 06/01/22 PATIENT NAME: Tasman Edelman [place] [address] [place] [address]   [phone] (home)  DOB: 1950-07-24 MRN: [id number] PRIMARY CARE PROVIDER:    Catha Gosselin, MD,  [address] [place] [address] [phone]  REFERRING PROVIDER:   Catha Gosselin, MD [address] [place],  [place] [address] [phone]  RESPONSIBLE PARTY:    Contact Information     Name Relation Home Work Mobile   Krontz,[name] Spouse [phone]  [phone]      TELEHEALTH VISIT STATEMENT This visit was done via telemedicine from my office and it was initiated and consent by this patient and or family. ACC Social worker Air traffic controller.  I connected with patient OR PROXY by a telephone/video  and verified that I am speaking with the correct person. I discussed the limitations of evaluation and management by telemedicine. Patient/proxy expressed understanding and agreed to proceed. Palliative Care was asked to follow this patient to address advance care planning and goals of care clarification.   Advance Care Planning: Advance care planning conversation included a discussion about:    The value and importance of advance care planning  Decision not to resuscitate or to de-escalate disease focused treatments due to poor prognosis.  CODE STATUS: DO NOT RESUSCITATE  I spent 16 minutes providing this consultation. More than 50% of the time in this consultation was spent in counseling and care coordination.  Thank you for the opportunity to participate in the care of Mr. Forest.  The palliative care team will continue to follow. Please call our office at [phone] if we can be of additional assistance.   Rosaura Carpenter, NP

## 2022-06-02 ENCOUNTER — Ambulatory Visit: Payer: Medicare PPO | Attending: Family Medicine

## 2022-06-02 DIAGNOSIS — R1312 Dysphagia, oropharyngeal phase: Secondary | ICD-10-CM | POA: Diagnosis not present

## 2022-06-02 DIAGNOSIS — R41841 Cognitive communication deficit: Secondary | ICD-10-CM | POA: Diagnosis not present

## 2022-06-02 DIAGNOSIS — R471 Dysarthria and anarthria: Secondary | ICD-10-CM | POA: Insufficient documentation

## 2022-06-02 DIAGNOSIS — R1311 Dysphagia, oral phase: Secondary | ICD-10-CM | POA: Diagnosis not present

## 2022-06-02 NOTE — Patient Instructions (Signed)
   Signs of Aspiration Pneumonia   Chest pain/tightness Fever (can be low grade) Cough  With foul-smelling phlegm (sputum) With sputum containing pus or blood With greenish sputum Fatigue  Shortness of breath  Wheezing   **IF YOU HAVE THESE SIGNS, CONTACT YOUR DOCTOR OR GO TO THE EMERGENCY DEPARTMENT OR URGENT CARE AS SOON AS POSSIBLE**     

## 2022-06-03 ENCOUNTER — Other Ambulatory Visit: Payer: Self-pay

## 2022-06-03 DIAGNOSIS — G1221 Amyotrophic lateral sclerosis: Secondary | ICD-10-CM | POA: Diagnosis not present

## 2022-06-03 DIAGNOSIS — M6289 Other specified disorders of muscle: Secondary | ICD-10-CM | POA: Diagnosis not present

## 2022-06-03 DIAGNOSIS — M791 Myalgia, unspecified site: Secondary | ICD-10-CM | POA: Diagnosis not present

## 2022-06-03 NOTE — Therapy (Signed)
OUTPATIENT SPEECH LANGUAGE PATHOLOGY SWALLOW EVALUATION   Patient Name: Guy Payne MRN: [id number] DOB:28-Nov-1950, 72 y.o., male Today's Date: 06/03/2022  PCP: Jackelyn Poling, DO REFERRING PROVIDER: Jackelyn Poling, DO  END OF SESSION:  End of Session - 06/03/22 1304     Visit Number 1    Number of Visits 5    Date for SLP Re-Evaluation 07/11/22    SLP Start Time 0803    SLP Stop Time  0845    SLP Time Calculation (min) 42 min    Activity Tolerance Patient tolerated treatment well             Past Medical History:  Diagnosis Date   Fall 2021   MCI (mild cognitive impairment)    Memory loss    Prostate disorder    blockage   Restless leg    Past Surgical History:  Procedure Laterality Date   genital surgery     was not specific   NO PAST SURGERIES     prostate repair for blockage     Patient Active Problem List   Diagnosis Date Noted   Periodic limb movements of sleep 05/14/2015   Amnestic MCI, 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) 11/16/2014   Glucose intolerance (impaired glucose tolerance) 11/16/2014    ONSET DATE: script dated 05/27/22, ALS dx since  approx 2021  REFERRING DIAG:  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  K08.89 (ICD-10-CM) - Difficulty chewing    THERAPY DIAG:  Dysphagia, oropharyngeal phase - Plan: SLP plan of care cert/re-cert  Cognitive communication disorder - Plan: SLP plan of care cert/re-cert  Dysarthria and anarthria - Plan: SLP plan of care cert/re-cert  Oral motor dysfunction - Plan: SLP plan of care cert/re-cert  Rationale for Evaluation and Treatment: Rehabilitation  SUBJECTIVE:   SUBJECTIVE STATEMENT: "Use your pad, Ory."  "He ate a McGriddle in the parking lot."   Pt accompanied by: significant other wife, [name]  PERTINENT HISTORY: Pt known to this clinician from 14 session course of ST in early 2022 targeting dysarthria, and cognitive communication, and briefly dysphagia. Pt  has declined in function since that course.  Palliative care visit 06/01/22: "SW and Nurse-D. Clementeen Graham completed a visit with patient at his home. His wife-[name] was present with him. Patient opened the door.  He is non-verbal, but he is able to write his thoughts/responses. Patient drools constantly. He denied pain. He report that he does have discomfort with his throat when he eats. He will choke on his saliva. Patient has a referral for swallowing through [facility] neurology. Patient had two falls last month.  He has difficulty and strain but now put Metamucil daily. He has a BM every 1-2 days. Patient is on a soft food diet and eats 6-7 meals per day. His current weight is approximately 145 lbs. Patient receives medical massage therapy weekly. He goes to ALS clinic at [facility] every 3-4 months.  Patient ambulates independently.  Patient is independent for all personal care. Patient's behavior is spontaneous, jovial and can be aggressive Patient was born in [place]. 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 He has been married 48 years. They have one son and four grandchildren. Patient is of Christian faith. He is in a men's Bible study group every Saturday.  Patient/wife have great support through their son, neighbors. His wife is able to do self-care, by line-dancing every Monday. He walks daily with the neighbors. [name] requested that patient  be a DNR. The NP added to the visit virtually for advance care planning-DNR left in the home.[name] serve as his POA/HCPOA."  MD note from last ALS clinic at [facility] (04/27/22): Assessment: 72 y.o. 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 He has had progression of dysarthria and dysphagia, though respiratory status is stable. Primary concern remains  dysphagia, weight and behavioral changes/cognitive issues. Plan: -Continue Robinul 1 mg daily -Continue donepezil 10 mg QHS -Script for bathroom renovations can be given, though this may not provide any financial benefit. His wife will message with details. -Doesn't want PEG because he couldn't use hot tub -Follow-up in 3 months Electronically signed by: Tanna Furry, MD 04/27/2022 5:15 PM  SLP note from last ALS clinic at [facility]: April 27, 2022 ALS CLINIC - Non-billable note Mr. Gieske was seen briefly to discuss his swallowing status. Known mod-severe oral dysphagia Reports more coughing during meals- occurs every day Good appetite and weight stable Does not want a feeding tube Risks of aspiration reviewed but patient has not had pneumonia, remains active and ambulatory and performs good oral care Interested in getting a swallow study at the next clinic he attends. FEES to be scheduled for his return date to clinic. Please contact me if there are any questions. Atha Starks, Med/CCC-SLP Speech Language Pathology Specialist [phone]   PAIN:  Are you having pain? No (indicates no)  FALLS: Has patient fallen in last 6 months?  Number of falls: 2   LIVING ENVIRONMENT: Lives with: lives with their spouse Lives in: House/apartment undergoing renovations in light of pt decline  PLOF:  Level of assistance: Needed assistance with ADLs, Needed assistance with IADLS Employment: Retired  PATIENT GOALS: Improve QOL  OBJECTIVE:   RECOMMENDATIONS FROM OBJECTIVE SWALLOW STUDY (MBSS/FEES):  See SLP note Atha Starks, SLP) from last ALS clinic. Pt to receive FEES next clinic visit. See note above for details.   COGNITION: Overall cognitive status: Impaired Areas of impairment:  Attention: Impaired: Selective, Alternating, Divided Memory: Impaired: Short term Long term Prospective Auditory Visual Awareness: Impaired: Emergent and Anticipatory Executive  function: Impaired: Impulse control, Problem solving, Organization, Planning, Error awareness, and Self-correction Behavior: Restless and Impulsive Functional deficits: Pt requires assistance for ADLs/IADLs  ORAL MOTOR EXAMINATION: Overall status: Impaired:   Labial: Bilateral (ROM and Coordination) Lingual: Bilateral (ROM and Coordination) Velum: ROM and Coordination Cough: Unable to elicit volitional cough Comments: Pt did not move lingual and labial musculature on command. It appeared he made intent to do so. Pt's motor speech was profoundly impaired. He wore a bandana to compensate for drooling and for oral dysphagia. Request to repeat simple bilabial words resulted in grunting, with <10% labial and lingual movement.   COMMUNICATION: Written means were functional for pt to communicate. Pt is unable to verbally communicate at this time.   CLINICAL SWALLOW ASSESSMENT:   Current diet: Dysphagia 3 (mechanical soft) and thin liquids Dentition: adequate natural dentition Patient directly observed with POs: Yes: dysphagia 1 (puree) and thin liquids  Feeding: able to feed self and needs assist due to decr'd cognition/impulsivity Liquids provided by: cup Oral phase signs and symptoms: anterior loss/spillage and prolonged bolus formation; pt tilted head back to move applesauce posteriorly in oral cavity.  Pharyngeal phase signs and symptoms: suspected delayed swallow initiation, wet vocal quality, delayed throat clear, and delayed cough SLP  assessed pt for lingual movement with POs and placed dot of applesauce on pt's rt labial margin, lt labial margin, and in the center of bottom lip. In all instances, pt's tongue made one fleeting movement approx 1mm forward in an attempt to reach for the dot in center of pt's bottom lip. No lateral movement was noted whatsoever, today.  PATIENT REPORTED OUTCOME MEASURES (PROM): EAT-10: provided next session   TODAY'S TREATMENT:                                                                                                                                          DATE:  06/02/22 (eval): SLP discussed eval results, tx plan, and other education below.  PATIENT EDUCATION: Education details: overt s/sx aspiration PNA and ramifications of this, SLP suspects pt is aspirating to some degree based on delayed cough and throat clears with POs, progression of speech and swallow decline in ALS, SLP willing to attempt to improve labial and lingual movement by encouraging root swallowing behaviors with pt once/week x4 weeks, tx plan may be less or more than 4 visits based on level of progress. Person educated: Patient and Spouse Education method: Explanation and Handouts Education comprehension: verbalized understanding and needs further education   ASSESSMENT:  CLINICAL IMPRESSION: Patient is a 72 y.o. male who was seen today for consult for swallowing in light of pt's FTD-ALS. [name] suggested SLP assist pt with closing his lips to decr drooling. SLP provided some education re: speech and swallowing progression with ALS, and also told pt/[name] SLP would be happy to attempt to target basic/root oral movements to improve pt QOL for 4 sessions, with frequency once a week. SLP to provide written material from ALS.ORG about swallowing changes and nutritional management next session. Pt is likely aspirating solids and liquids but does not report any PNA or bronchitis or other pulmonary issues to date. Pt does not want feeding tube placed. Plan is to have FEES at next ALS clinic, per SLP Atha Starks. According to last ALS clinic note pt also has good oral care. Pt/wife were provided with overt s/sx aspiration PNA today.  OBJECTIVE IMPAIRMENTS: include attention, memory, awareness, executive functioning, dysarthria, and dysphagia. These impairments are limiting patient from managing medications, managing appointments, managing finances, household responsibilities,  ADLs/IADLs, effectively communicating at home and in community, and safety when swallowing. Factors affecting potential to achieve goals and functional outcome are ability to learn/carryover information, medical prognosis, previous level of function, and severity of impairments. Patient will benefit from skilled SLP services to address above impairments and improve overall function.  REHAB POTENTIAL: Poor based upon medical diagnosis   GOALS: Goals reviewed with patient? Yes   LONG TERM GOALS: Target date: 07/04/22  Pt will demo incr'd lingual and/or labial movement given a gustatory stimulus on labial structures, x10/session x2 sessions Baseline:  Goal status: INITIAL  2.  Pt and/or wife  will provide 3 overt s/sx aspiration PNA, in 2 sessions Baseline:  Goal status: INITIAL  3.  Pt will take information home about swallowing changes and nutrition management in ALS, provided by SLP Baseline:  Goal status: INITIAL   PLAN:  SLP FREQUENCY: 1x/week  SLP DURATION: 4 weeks  PLANNED INTERVENTIONS: Environmental controls, Oral motor exercises, SLP instruction and feedback, and Patient/family education    [facility], CCC-SLP 06/03/2022, 1:04 PM

## 2022-06-10 DIAGNOSIS — G1221 Amyotrophic lateral sclerosis: Secondary | ICD-10-CM | POA: Diagnosis not present

## 2022-06-15 ENCOUNTER — Ambulatory Visit: Payer: Medicare PPO

## 2022-06-15 DIAGNOSIS — R41841 Cognitive communication deficit: Secondary | ICD-10-CM

## 2022-06-15 DIAGNOSIS — R1312 Dysphagia, oropharyngeal phase: Secondary | ICD-10-CM | POA: Diagnosis not present

## 2022-06-15 DIAGNOSIS — R1311 Dysphagia, oral phase: Secondary | ICD-10-CM

## 2022-06-15 DIAGNOSIS — R471 Dysarthria and anarthria: Secondary | ICD-10-CM | POA: Diagnosis not present

## 2022-06-16 NOTE — Therapy (Signed)
OUTPATIENT SPEECH LANGUAGE PATHOLOGY SWALLOW EVALUATION   Patient Name: Guy Payne MRN: [id number] DOB:1950/09/06, 72 y.o., male Today's Date: 06/16/2022  PCP: Jackelyn Poling, DO REFERRING PROVIDER: Jackelyn Poling, DO  END OF SESSION:  End of Session - 06/16/22 0011     Visit Number 2    Number of Visits 5    Date for SLP Re-Evaluation 07/11/22    SLP Start Time 0803    SLP Stop Time  0845    SLP Time Calculation (min) 42 min    Activity Tolerance Patient tolerated treatment well             Past Medical History:  Diagnosis Date   Fall 2021   MCI (mild cognitive impairment)    Memory loss    Prostate disorder    blockage   Restless leg    Past Surgical History:  Procedure Laterality Date   genital surgery     was not specific   NO PAST SURGERIES     prostate repair for blockage     Patient Active Problem List   Diagnosis Date Noted   Periodic limb movements of sleep 05/14/2015   Amnestic MCI, 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) 11/16/2014   Glucose intolerance (impaired glucose tolerance) 11/16/2014    ONSET DATE: script dated 05/27/22, ALS dx since  approx 2021  REFERRING DIAG:  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 Pt has declined in function since that course.  Palliative care visit 06/01/22: "SW and Nurse-D. Clementeen Graham completed a visit with patient at his home. His  wife-Guy Payne was present with him. Patient opened the door.  He is non-verbal, but he is able to write his thoughts/responses. Patient drools constantly. He denied pain. He report that he does have discomfort with his throat when he eats. He will choke on his saliva. Patient has a referral for swallowing through [facility] neurology. Patient had two falls last month.  He has difficulty and strain but now put Metamucil daily. He has a BM every 1-2 days. Patient is on a soft food diet and eats 6-7 meals per day. His current weight is approximately 145 lbs. Patient receives medical massage therapy weekly. He goes to ALS clinic at [facility] every 3-4 months.  Patient ambulates independently.  Patient is independent for all personal care. Patient's behavior is spontaneous, jovial and can be aggressive Patient was born [place]. 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 He has been married 48 years. They have one son and four grandchildren. Patient is of Christian faith. He is in a men's Bible study group every Saturday.  Patient/wife have great support through their son, neighbors. His wife is able to do self-care, by line-dancing every Monday. He walks daily with the neighbors. [name] requested that patient be a DNR. The NP added to the visit virtually for advance care planning-DNR left in the home.[name] serve as his POA/HCPOA."  MD note from last  ALS clinic at [facility] (04/27/22): Assessment: 72 y.o. 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 He has had progression of dysarthria and dysphagia, though respiratory status is stable. Primary concern remains dysphagia, weight and behavioral changes/cognitive issues. Plan: -Continue Robinul 1 mg daily -Continue donepezil 10 mg QHS -Script for bathroom  renovations can be given, though this may not provide any financial benefit. His wife will message with details. -Doesn't want PEG because he couldn't use hot tub -Follow-up in 3 months Electronically signed by: Tanna Furry, MD 04/27/2022 5:15 PM  SLP note from last ALS clinic at [facility]: April 27, 2022 ALS CLINIC - Non-billable note Mr. Kalmbach was seen briefly to discuss his swallowing status. Known mod-severe oral dysphagia Reports more coughing during meals- occurs every day Good appetite and weight stable Does not want a feeding tube Risks of aspiration reviewed but patient has not had pneumonia, remains active and ambulatory and performs good oral care Interested in getting a swallow study at the next clinic he attends. FEES to be scheduled for his return date to clinic. Please contact me if there are any questions. Atha Starks, Med/CCC-SLP Speech Language Pathology Specialist [phone]   PAIN:  Are you having pain? No (indicates no)  FALLS: Has patient fallen in last 6 months?  Number of falls: 2   LIVING ENVIRONMENT: Lives with: lives with their spouse Lives in: House/apartment undergoing renovations in light of pt decline  PLOF:  Level of assistance: Needed assistance with ADLs, Needed assistance with IADLS Employment: Retired  PATIENT GOALS: Improve QOL  OBJECTIVE:   RECOMMENDATIONS FROM OBJECTIVE SWALLOW STUDY (MBSS/FEES):  See SLP note Atha Starks, SLP) from last ALS clinic. Pt to receive FEES next clinic visit. See note above for details.   COGNITION: Overall cognitive status: Impaired Areas of impairment:  Attention: Impaired: Selective, Alternating, Divided Memory: Impaired: Short term Long term Prospective Auditory Visual Awareness: Impaired: Emergent and Anticipatory Executive function: Impaired: Impulse control, Problem solving, Organization, Planning, Error awareness, and Self-correction Behavior: Restless and  Impulsive Functional deficits: Pt requires assistance for ADLs/IADLs  ORAL MOTOR EXAMINATION: Overall status: Impaired:   Labial: Bilateral (ROM and Coordination) Lingual: Bilateral (ROM and Coordination) Velum: ROM and Coordination Cough: Unable to elicit volitional cough Comments: Pt did not move lingual and labial musculature on command. It appeared he made intent to do so. Pt's motor speech was profoundly impaired. He wore a bandana to compensate for drooling and for oral dysphagia. Request to repeat simple bilabial words resulted in grunting, with <10% labial and lingual movement.   COMMUNICATION: Written means were functional for pt to communicate. Pt is unable to verbally communicate at this time.   CLINICAL SWALLOW ASSESSMENT:   Current diet: Dysphagia 3 (mechanical soft) and thin liquids Dentition: adequate natural dentition Patient directly observed with POs: Yes: dysphagia 1 (puree) and thin liquids  Feeding: able to feed self and needs assist due to decr'd cognition/impulsivity Liquids provided by: cup Oral phase signs and symptoms: anterior loss/spillage and prolonged bolus formation; pt tilted head back to move applesauce posteriorly in oral cavity.  Pharyngeal phase signs and symptoms: suspected delayed swallow initiation, wet vocal quality, delayed throat clear, and delayed cough SLP assessed pt for lingual movement with POs and placed dot of applesauce on pt's rt labial margin, lt labial margin, and in the center of bottom lip. In  all instances, pt's tongue made one fleeting movement approx 1mm forward in an attempt to reach for the dot in center of pt's bottom lip. No lateral movement was noted whatsoever, today.  PATIENT REPORTED OUTCOME MEASURES (PROM): EAT-10: provided next session   TODAY'S TREATMENT:                                                                                                                                         DATE:  06/15/22: SLP provided  education for pt's wife the entire session about pt's swallowing, oral motor movement/absence of movement, reviewed overt s/sx aspiration PNA, safest food to eat (puree), swallow precautions (small bites/sips, double swallow with all POs, oral care following POs). Pt entered room with "wet" voice, SLP told pt clear throat and swallow, and voice cleared. SLP noted delay of swallow trigger approx 5 seconds. SLP presented information on from ALS Association on changes in swallowing in ALS, and suggested wife and or look through and read for information and SLP can discuss ?s with pt/wife next session.  Pt brought McGriddle from [employer], a gogo squeeze, and an Ensure with him. SLP had to queue patient x2 not to begin eating McGriddle while SLP explaining handout to pt/wife. Pt req'd usual initial cues for smaller bite (faded to rare min when pt did not suspect SLP was still watching him eat). Copious amounts of anterior labial leakage noted; pt had to take index finger to move bolus from rt-lt and lt-rt. Pt with vertical and not rotary chewing pattern, very light so that he was swallowing inadequately masticated solid bolus. Avyay required SLP reiterated x3-4 during session that SLP would, based on what SLP was seeing today, likely be recommending feeding tube after objective swallow eval at ALS clinic in May, but that if pt wanted to cont to eat despite of knowing the risks of aspiration/aspiration PNA that was his decision. Pt cont to eat and drink.It is documented in pt's [facility] notes that pt does not wish PEG due to he could no longer enjoy the hot tub. SLP explained to pt/wife that pt is very likely aspirating but is active and PNA has not yet formed in pt's lungs. SLP educated wife that when pt coughs with meals, encourage HARD cough and reswallow. Guy Payne cued pt appropriately x4 during session today for this.  SLP stressed pureed and dys II foods today with pt and wife, and also ensuring meticulous oral  care following POs - and SLP explained rationale for this to pt/wife.  Lastly, SLP placed very small dabs of applesauce on pt's lt lower, rt lower, and middle of pt's lower lip without volitional lingual movement to those areas. SLP educated Guy Payne about volitional lingual movement vs. Reflexive movements.    06/02/22 (eval): SLP discussed eval results, tx plan, and other education below.  PATIENT EDUCATION: Education details: overt s/sx aspiration PNA and ramifications of this,  SLP suspects pt is aspirating to some degree based on delayed cough and throat clears with POs, progression of speech and swallow decline in ALS, SLP willing to attempt to improve labial and lingual movement by encouraging root swallowing behaviors with pt once/week x4 weeks, tx plan may be less or more than 4 visits based on level of progress. Person educated: Patient and Spouse Education method: Explanation and Handouts Education comprehension: verbalized understanding and needs further education   ASSESSMENT:  CLINICAL IMPRESSION: Patient is a 72 y.o. male who was seen today for therapy for swallowing in light of pt's FTD-ALS. Guy Payne suggested SLP assist pt with closing his lips to decr drooling. SLP provided written material from ALS.ORG about swallowing changes and nutritional management today Pt is likely aspirating solids and liquids but does not report any PNA or bronchitis or other pulmonary issues to date. Pt does not want feeding tube placed. Plan is to have FEES at next ALS clinic, per SLP Atha Starks. According to last ALS clinic note pt also has good oral care. Pt/wife were provided with overt s/sx aspiration PNA today.  OBJECTIVE IMPAIRMENTS: include attention, memory, awareness, executive functioning, dysarthria, and dysphagia. These impairments are limiting patient from managing medications, managing appointments, managing finances, household responsibilities, ADLs/IADLs, effectively communicating at home  and in community, and safety when swallowing. Factors affecting potential to achieve goals and functional outcome are ability to learn/carryover information, medical prognosis, previous level of function, and severity of impairments. Patient will benefit from skilled SLP services to address above impairments and improve overall function.  REHAB POTENTIAL: Poor based upon medical diagnosis   GOALS: Goals reviewed with patient? Yes   LONG TERM GOALS: Target date: 07/04/22  Pt will demo incr'd lingual and/or labial movement given a gustatory stimulus on labial structures, x10/session x2 sessions Baseline:  Goal status: Ongoing  2.  Pt and/or wife will provide 3 overt s/sx aspiration PNA, in 2 sessions Baseline:  Goal status: Ongoing  3.  Pt will take information home about swallowing changes and nutrition management in ALS, provided by SLP Baseline:  Goal status: Ongoing   PLAN:  SLP FREQUENCY: 1x/week  SLP DURATION: 4 weeks  PLANNED INTERVENTIONS: Environmental controls, Oral motor exercises, SLP instruction and feedback, and Patient/family education    [facility], CCC-SLP 06/16/2022, 12:12 AM

## 2022-06-18 DIAGNOSIS — G1221 Amyotrophic lateral sclerosis: Secondary | ICD-10-CM | POA: Diagnosis not present

## 2022-06-20 ENCOUNTER — Ambulatory Visit: Payer: Medicare PPO

## 2022-06-20 DIAGNOSIS — R1312 Dysphagia, oropharyngeal phase: Secondary | ICD-10-CM

## 2022-06-20 DIAGNOSIS — R41841 Cognitive communication deficit: Secondary | ICD-10-CM | POA: Diagnosis not present

## 2022-06-20 DIAGNOSIS — R1311 Dysphagia, oral phase: Secondary | ICD-10-CM | POA: Diagnosis not present

## 2022-06-20 DIAGNOSIS — R471 Dysarthria and anarthria: Secondary | ICD-10-CM

## 2022-06-20 NOTE — Therapy (Signed)
OUTPATIENT SPEECH LANGUAGE PATHOLOGY SWALLOW TREATMENT   Patient Name: Guy Payne MRN: [id number] DOB:May 02, 1950, 72 y.o., male Today's Date: 06/20/2022  PCP: Jackelyn Poling, DO REFERRING PROVIDER: Jackelyn Poling, DO  END OF SESSION:  End of Session - 06/20/22 0826     Visit Number 3    Number of Visits 5    Date for SLP Re-Evaluation 07/11/22    SLP Start Time 0805    SLP Stop Time  0841    SLP Time Calculation (min) 36 min    Activity Tolerance Patient tolerated treatment well             Past Medical History:  Diagnosis Date   Fall 2021   MCI (mild cognitive impairment)    Memory loss    Prostate disorder    blockage   Restless leg    Past Surgical History:  Procedure Laterality Date   genital surgery     was not specific   NO PAST SURGERIES     prostate repair for blockage     Patient Active Problem List   Diagnosis Date Noted   Periodic limb movements of sleep 05/14/2015   Amnestic MCI, 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  Pt entered with mashed (dys II) fruit mixture, wet scrambled eggs, and mini-muffins with a protein shake.   Pt accompanied by: significant other wife, Guy Payne  PERTINENT HISTORY: Pt known to this clinician from 14 session course of ST in early 2022 targeting dysarthria, and cognitive communication, and briefly dysphagia. Pt has declined in function since that course.  Palliative care visit 06/01/22: "SW and Nurse-D.  Clementeen Graham completed a visit with patient at his home. His wife-Guy Payne was present with him. Patient opened the door.  He is non-verbal, but he is able to write his thoughts/responses. Patient drools constantly. He denied pain. He report that he does have discomfort with his throat when he eats. He will choke on his saliva. Patient has a referral for swallowing through [facility] neurology. Patient had two falls last month.  He has difficulty and strain but now put Metamucil daily. He has a BM every 1-2 days. Patient is on a soft food diet and eats 6-7 meals per day. His current weight is approximately 145 lbs. Patient receives medical massage therapy weekly. He goes to ALS clinic at [facility] every 3-4 months.  Patient ambulates independently.  Patient is independent for all personal care. Patient's behavior is spontaneous, jovial and can be aggressive Patient was born in [place]. 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 He has been married 48 years. They have one son and four grandchildren. Patient is of Christian faith. He is in a men's Bible study group every Saturday.  Patient/wife have great support through their son, neighbors. His wife is able to do self-care, by line-dancing every Monday. He walks daily with the neighbors. [name] requested that patient be a DNR. The NP added to the visit virtually for advance care planning-DNR left in the home.[name]  serve as his POA/HCPOA."  MD note from last ALS clinic at [facility] (04/27/22): Assessment: 72 y.o. 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 He has had progression of dysarthria and dysphagia, though respiratory status is stable. Primary concern remains dysphagia, weight and behavioral changes/cognitive issues. Plan: -Continue Robinul 1 mg  daily -Continue donepezil 10 mg QHS -Script for bathroom renovations can be given, though this may not provide any financial benefit. His wife will message with details. -Doesn't want PEG because he couldn't use hot tub -Follow-up in 3 months Electronically signed by: Tanna Furry, MD 04/27/2022 5:15 PM  SLP note from last ALS clinic at [facility]: April 27, 2022 ALS CLINIC - Non-billable note Mr. Guy Payne was seen briefly to discuss his swallowing status. Known mod-severe oral dysphagia Reports more coughing during meals- occurs every day Good appetite and weight stable Does not want a feeding tube Risks of aspiration reviewed but patient has not had pneumonia, remains active and ambulatory and performs good oral care Interested in getting a swallow study at the next clinic he attends. FEES to be scheduled for his return date to clinic. Please contact me if there are any questions. Atha Starks, Med/CCC-SLP Speech Language Pathology Specialist [phone]   PAIN:  Are you having pain? No (indicates no)  FALLS: Has patient fallen in last 6 months?  Number of falls: 2   LIVING ENVIRONMENT: Lives with: lives with their spouse Lives in: House/apartment undergoing renovations in light of pt decline  PLOF:  Level of assistance: Needed assistance with ADLs, Needed assistance with IADLS Employment: Retired  PATIENT GOALS: Improve QOL  OBJECTIVE:   RECOMMENDATIONS FROM OBJECTIVE SWALLOW STUDY (MBSS/FEES):  See SLP note Atha Starks, SLP) from last ALS clinic. Pt to receive FEES next clinic visit. See note above for details.    COMMUNICATION: Written means were functional for pt to communicate. Pt is unable to verbally communicate at this time.   CLINICAL SWALLOW ASSESSMENT:   Current diet: Dysphagia 3 (mechanical soft) and thin liquids Dentition: adequate natural dentition Patient directly observed with POs: Yes: dysphagia 1 (puree) and thin liquids   Feeding: able to feed self and needs assist due to decr'd cognition/impulsivity Liquids provided by: cup Oral phase signs and symptoms: anterior loss/spillage and prolonged bolus formation; pt tilted head back to move applesauce posteriorly in oral cavity.  Pharyngeal phase signs and symptoms: suspected delayed swallow initiation, wet vocal quality, delayed throat clear, and delayed cough SLP assessed pt for lingual movement with POs and placed dot of applesauce on pt's rt labial margin, lt labial margin, and in the center of bottom lip. In all instances, pt's tongue made one fleeting movement approx 1mm forward in an attempt to reach for the dot in center of pt's bottom lip. No lateral movement was noted whatsoever, today.  PATIENT REPORTED OUTCOME MEASURES (PROM): EAT-10: provided next session   TODAY'S TREATMENT:  DATE:  06/20/22: EAT-10 will be provided next session. Today Darek req'd consistent mod cues for double swallows due to impulsivity. "I'm not sure why he can't just remember fo swallow it," Guy Payne stated. SLP shared again that due to pt's impulsivity he will likely always need cueing for him to slow down and finish what is in his mouth prior to administering next bite. Pt became better in terms of requiring less frequent cues (faded to occasional min-mod A) but con't to demo impulsive behavior while eating. Pt used finger for oral manipulation and used head tilt and tongue rocking to propel food to posterior oral cavity. SLP again ensured pt and wife know risk of pt eating POs at this time, and were educated on overt s/sx of aspiration PNA. SLP strongly urged pt and wife keep those s/sx in an important location should they need to consult them. Today, "wet" voice is pt's only concerning behavior/sx. Anterior labial leakage was mitigated by pt wiping his  mouth 90% of the time. SLP placed applesauce on pt's rt and lt labial margins and no lingual lateral movement was noted, just reflexive tongue rocking behavior. When placed just lateral to medial position pt could remove >50% of applesauce with repeated lingual rocking/protrusion motion. SLP told pt wife to complete this at home with puree at least once/day.  06/15/22: SLP provided education for pt's wife the entire session about pt's swallowing, oral motor movement/absence of movement, reviewed overt s/sx aspiration PNA, safest food to eat (puree), swallow precautions (small bites/sips, double swallow with all POs, oral care following POs). Pt entered room with "wet" voice, SLP told pt clear throat and swallow, and voice cleared. SLP noted delay of swallow trigger approx 5 seconds. SLP presented information on from ALS Association on changes in swallowing in ALS, and suggested wife and or look through and read for information and SLP can discuss ?s with pt/wife next session.  Pt brought McGriddle from [employer], a gogo squeeze, and an Ensure with him. SLP had to queue patient x2 not to begin eating McGriddle while SLP explaining handout to pt/wife. Pt req'd usual initial cues for smaller bite (faded to rare min when pt did not suspect SLP was still watching him eat). Copious amounts of anterior labial leakage noted; pt had to take index finger to move bolus from rt-lt and lt-rt. Pt with vertical and not rotary chewing pattern, very light so that he was swallowing inadequately masticated solid bolus. Cordarious required SLP reiterated x3-4 during session that SLP would, based on what SLP was seeing today, likely be recommending feeding tube after objective swallow eval at ALS clinic in May, but that if pt wanted to cont to eat despite of knowing the risks of aspiration/aspiration PNA that was his decision. Pt cont to eat and drink.It is documented in pt's [facility] notes that pt does not wish PEG due to he could no longer  enjoy the hot tub. SLP explained to pt/wife that pt is very likely aspirating but is active and PNA has not yet formed in pt's lungs. SLP educated wife that when pt coughs with meals, encourage HARD cough and reswallow. Guy Payne cued pt appropriately x4 during session today for this.  SLP stressed pureed and dys II foods today with pt and wife, and also ensuring meticulous oral care following POs - and SLP explained rationale for this to pt/wife.  Lastly, SLP placed very small dabs of applesauce on pt's lt lower, rt lower, and middle of pt's lower lip without  volitional lingual movement to those areas. SLP educated Guy Payne about volitional lingual movement vs. Reflexive movements.    06/02/22 (eval): SLP discussed eval results, tx plan, and other education below.  PATIENT EDUCATION: Education details: overt s/sx aspiration PNA and ramifications of this, SLP suspects pt is aspirating to some degree based on delayed cough and throat clears with POs, progression of speech and swallow decline in ALS, SLP willing to attempt to improve labial and lingual movement by encouraging root swallowing behaviors with pt once/week x4 weeks, tx plan may be less or more than 4 visits based on level of progress. Person educated: Patient and Spouse Education method: Explanation and Handouts Education comprehension: verbalized understanding and needs further education   ASSESSMENT:  CLINICAL IMPRESSION: Patient is a 72 y.o. male who was seen today for therapy for swallowing in light of pt's FTD-ALS.  Pt is likely aspirating solids and liquids (SLP again told Guy Payne this today), but does not report any PNA or bronchitis or other pulmonary issues to date. Pt does not want feeding tube placed. Plan is to have FEES at next ALS clinic, per SLP Atha Starks. According to last ALS clinic note pt also has good oral care. Pt/wife were provided with overt s/sx aspiration PNA today.  OBJECTIVE IMPAIRMENTS: include attention,  memory, awareness, executive functioning, dysarthria, and dysphagia. These impairments are limiting patient from managing medications, managing appointments, managing finances, household responsibilities, ADLs/IADLs, effectively communicating at home and in community, and safety when swallowing. Factors affecting potential to achieve goals and functional outcome are ability to learn/carryover information, medical prognosis, previous level of function, and severity of impairments. Patient will benefit from skilled SLP services to address above impairments and improve overall function.  REHAB POTENTIAL: Poor based upon medical diagnosis   GOALS: Goals reviewed with patient? Yes   LONG TERM GOALS: Target date: 07/04/22  Pt will demo incr'd lingual and/or labial movement given a gustatory stimulus on labial structures, x10/session x2 sessions Baseline:  Goal status: Ongoing  2.  Pt and/or wife will provide 3 overt s/sx aspiration PNA, in 2 sessions Baseline:  Goal status: Ongoing  3.  Pt will take information home about swallowing changes and nutrition management in ALS, provided by SLP Baseline:  Goal status: Ongoing   PLAN:  SLP FREQUENCY: 1x/week  SLP DURATION: 4 weeks  PLANNED INTERVENTIONS: Environmental controls, Oral motor exercises, SLP instruction and feedback, and Patient/family education    [facility], CCC-SLP 06/20/2022, 3:25 PM

## 2022-06-24 DIAGNOSIS — G1221 Amyotrophic lateral sclerosis: Secondary | ICD-10-CM | POA: Diagnosis not present

## 2022-06-30 ENCOUNTER — Other Ambulatory Visit: Payer: Medicare PPO

## 2022-06-30 VITALS — BP 112/76 | HR 55 | Temp 97.6°F

## 2022-06-30 DIAGNOSIS — Z515 Encounter for palliative care: Secondary | ICD-10-CM

## 2022-06-30 NOTE — Progress Notes (Signed)
PATIENT NAME: Guy Payne DOB: 1950/08/19 MRN: [id number]  PRIMARY CARE PROVIDER: Catha Gosselin, MD  RESPONSIBLE PARTY:  Acct ID - Guarantor Home Phone Work Phone Relationship Acct Type  [id number] Guy Payne* [phone]  Self P/F     [address], [place], [place] [address]   Palliative Care Initial Encounter Note    Completed home visit. Wife Guy Payne also present.     HISTORY OF PRESENT ILLNESS: ALS, Dementia     TODAY'S VISIT:   Respiratory: no SOB at this time Lung sounds are clear   Cognitive: remains alert and answers all questions appropriately; writes on paper to communicate   Appetite: eats 6-7 small meals; soft diet; encouraged pt to drink more water; has difficulty swallowing   GI/GU: every 1-2 days; takes Metamucil and lets it get thick enough to eat with a spoon; no urinary issues   Mobility: independent; no assistive devices needed   ADLs: independent bathing, grooming and dressing; cooking, cleaning, driving done by wife   Sleeping Pattern: doesn't sleep through the night; takes 2-4 naps during the day   Pain: denies pain at this time      Goals of Care: To stay in the home with his wife     Next Appt Scheduled For: 08/23/22 at 8:30am   CODE STATUS: DNR ADVANCED DIRECTIVES: N MOST FORM: N PPS: 90%   PHYSICAL EXAM:   VITALS: Today's Vitals   06/30/22 0840  BP: 112/76  Pulse: (!) 55  Temp: 97.6 F (36.4 C)  TempSrc: Temporal  SpO2: 97%  PainSc: 0-No pain    LUNGS: clear to auscultation , decreased breath sounds CARDIAC: Cor RRR EXTREMITIES: AROM x4; no edema SKIN: cool, dry, intact, skin tenting noted NEURO: negative except for memory problems and speech problems      Guy Ivey Clementeen Graham, LPN

## 2022-07-01 ENCOUNTER — Ambulatory Visit: Payer: Medicare PPO | Attending: Family Medicine

## 2022-07-01 DIAGNOSIS — R471 Dysarthria and anarthria: Secondary | ICD-10-CM

## 2022-07-01 DIAGNOSIS — R1311 Dysphagia, oral phase: Secondary | ICD-10-CM | POA: Diagnosis not present

## 2022-07-01 DIAGNOSIS — R1312 Dysphagia, oropharyngeal phase: Secondary | ICD-10-CM | POA: Diagnosis not present

## 2022-07-01 DIAGNOSIS — G1221 Amyotrophic lateral sclerosis: Secondary | ICD-10-CM | POA: Diagnosis not present

## 2022-07-01 DIAGNOSIS — R41841 Cognitive communication deficit: Secondary | ICD-10-CM | POA: Diagnosis not present

## 2022-07-01 NOTE — Patient Instructions (Signed)
   Do oral care (good teeth brushing, Listerine or some other mouthwash swish with head down at sink and then swab out entire mouth) both before and after eating Before liquids (you will need to start liquids within 30 minutes)

## 2022-07-01 NOTE — Therapy (Signed)
OUTPATIENT SPEECH LANGUAGE PATHOLOGY SWALLOW TREATMENT   Patient Name: Guy Payne MRN: [id number] DOB:08-Jun-1950, 72 y.o., male Today's Date: 07/01/2022  PCP: Jackelyn Poling, DO REFERRING PROVIDER: Jackelyn Poling, DO  END OF SESSION:  End of Session - 07/01/22 0826     Visit Number 4    Number of Visits 5    Date for SLP Re-Evaluation 07/11/22    SLP Start Time 0806    SLP Stop Time  0845    SLP Time Calculation (min) 39 min    Activity Tolerance Patient tolerated treatment well             Past Medical History:  Diagnosis Date   Fall 2021   MCI (mild cognitive impairment)    Memory loss    Prostate disorder    blockage   Restless leg    Past Surgical History:  Procedure Laterality Date   genital surgery     was not specific   NO PAST SURGERIES     prostate repair for blockage     Patient Active Problem List   Diagnosis Date Noted   Periodic limb movements of sleep 05/14/2015   Amnestic MCI, 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  Pt entered with mashed (dys II) fruit mixture, wet scrambled eggs, and mini-muffins with a protein shake.   Pt accompanied by: significant other wife, Guy Payne  PERTINENT HISTORY: Pt known to this clinician from 14 session course of ST in early 2022 targeting dysarthria, and cognitive communication, and briefly dysphagia. Pt has declined in function since that course.  Palliative care visit 06/01/22: "SW and Nurse-D.  Clementeen Graham completed a visit with patient at his home. His wife-Guy Payne was present with him. Patient opened the door.  He is non-verbal, but he is able to write his thoughts/responses. Patient drools constantly. He denied pain. He report that he does have discomfort with his throat when he eats. He will choke on his saliva. Patient has a referral for swallowing through [facility] neurology. Patient had two falls last month.  He has difficulty and strain but now put Metamucil daily. He has a BM every 1-2 days. Patient is on a soft food diet and eats 6-7 meals per day. His current weight is approximately 145 lbs. Patient receives medical massage therapy weekly. He goes to ALS clinic at [facility] every 3-4 months.  Patient ambulates independently.  Patient is independent for all personal care. Patient's behavior is spontaneous, jovial and can be aggressive Patient was born in [place]. 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 He has been married 48 years. They have one son and four grandchildren. Patient is of Christian faith. He is in a men's Bible study group every Saturday.  Patient/wife have great support through their son, neighbors. His wife is able to do self-care, by line-dancing every Monday. He walks daily with the neighbors. [name] requested that patient be a DNR. The NP added to the visit virtually for advance care planning-DNR left in the home.[name]  serve as his POA/HCPOA."  MD note from last ALS clinic at [facility] (04/27/22): Assessment: 72 y.o. 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 He has had progression of dysarthria and dysphagia, though respiratory status is stable. Primary concern remains dysphagia, weight and behavioral changes/cognitive issues. Plan: -Continue Robinul 1 mg  daily -Continue donepezil 10 mg QHS -Script for bathroom renovations can be given, though this may not provide any financial benefit. His wife will message with details. -Doesn't want PEG because he couldn't use hot tub -Follow-up in 3 months Electronically signed by: Tanna Furry, MD 04/27/2022 5:15 PM  SLP note from last ALS clinic at [facility]: April 27, 2022 ALS CLINIC - Non-billable note Mr. [name] was seen briefly to discuss his swallowing status. Known mod-severe oral dysphagia Reports more coughing during meals- occurs every day Good appetite and weight stable Does not want a feeding tube Risks of aspiration reviewed but patient has not had pneumonia, remains active and ambulatory and performs good oral care Interested in getting a swallow study at the next clinic he attends. FEES to be scheduled for his return date to clinic. Please contact me if there are any questions. Atha Starks, Med/CCC-SLP Speech Language Pathology Specialist [phone]   PAIN:  Are you having pain? No (indicates no)  FALLS: Has patient fallen in last 6 months?  Number of falls: 2   LIVING ENVIRONMENT: Lives with: lives with their spouse Lives in: House/apartment undergoing renovations in light of pt decline  PLOF:  Level of assistance: Needed assistance with ADLs, Needed assistance with IADLS Employment: Retired  PATIENT GOALS: Improve QOL  OBJECTIVE:   RECOMMENDATIONS FROM OBJECTIVE SWALLOW STUDY (MBSS/FEES):  See SLP note Atha Starks, SLP) from last ALS clinic. Pt to receive FEES next clinic visit. See note above for details.    COMMUNICATION: Written means were functional for pt to communicate. Pt is unable to verbally communicate at this time.   CLINICAL SWALLOW ASSESSMENT:   Current diet: Dysphagia 3 (mechanical soft) and thin liquids Dentition: adequate natural dentition Patient directly observed with POs: Yes: dysphagia 1 (puree) and thin liquids   Feeding: able to feed self and needs assist due to decr'd cognition/impulsivity Liquids provided by: cup Oral phase signs and symptoms: anterior loss/spillage and prolonged bolus formation; pt tilted head back to move applesauce posteriorly in oral cavity.  Pharyngeal phase signs and symptoms: suspected delayed swallow initiation, wet vocal quality, delayed throat clear, and delayed cough SLP assessed pt for lingual movement with POs and placed dot of applesauce on pt's rt labial margin, lt labial margin, and in the center of bottom lip. In all instances, pt's tongue made one fleeting movement approx 1mm forward in an attempt to reach for the dot in center of pt's bottom lip. No lateral movement was noted whatsoever, today.  PATIENT REPORTED OUTCOME MEASURES (PROM): EAT-10: provided next session   TODAY'S TREATMENT:  DATE:  07/01/22: SLP did not provide EAT-10. Will do this next session. Guy Payne shared that the palliative nurse told her this week that thickened liquids do not count for liquids during the day, due to their viscosity. She told the SLP that she was going to switch to juice or flavored waters for meals. SLP strongly advised against that , due to aspiration risk. SLP suggested instead that patient have thin liquids after stringent oral care at least twice daily. SLP took this time to reiterate the importance of stringent oral care both before meals, and after meals, and explained the rationale multiple times during the session today.Overt s/sx aspiration PNA reviewed today. Pt lingual ROM with pureed POs placed on pt's lower lip just medial to midline was unchanged from previous session. Guy Payne told SLP she completed this task with pt at home this week. With POs, pt req'd usual min-mod cues for multiple swallows, and usual min cues for finishing solids  prior to drinking liquids, and occasional min cues for smaller sips. Pt took small bites with independence. Minimal lingual retraction during oral stage was noted and pt moved solid bolus posteriorly with what appeared to be mostly tongue rocking and tilting head. Anterior labial leakage noted consistently, and pt used napkin to wipe lower lip. SLP again suggested pt consider PEG for maximizing pt safety and pt shook his head. Wife stated, "He doesn't want one." Pt demonstrated "Wet" voice with all initial swallows of nutrition shake (nectar consistency).   06/20/22: EAT-10 will be provided next session. Today Helen req'd consistent mod cues for double swallows due to impulsivity. "I'm not sure why he can't just remember fo swallow it," Guy Payne stated. SLP shared again that due to pt's impulsivity he will likely always need cueing for him to slow down and finish what is in his mouth prior to administering next bite. Pt became better in terms of requiring less frequent cues (faded to occasional min-mod A) but con't to demo impulsive behavior while eating. Pt used finger for oral manipulation and used head tilt and tongue rocking to propel food to posterior oral cavity. SLP again ensured pt and wife know risk of pt eating POs at this time, and were educated on overt s/sx of aspiration PNA. SLP strongly urged pt and wife keep those s/sx in an important location should they need to consult them. Today, "wet" voice is pt's only concerning behavior/sx. Anterior labial leakage was mitigated by pt wiping his mouth 90% of the time. SLP placed applesauce on pt's rt and lt labial margins and no lingual lateral movement was noted, just reflexive tongue rocking behavior. When placed just lateral to medial position pt could remove >50% of applesauce with repeated lingual rocking/protrusion motion. SLP told pt wife to complete this at home with puree at least once/day.  06/15/22: SLP provided education for pt's wife the entire  session about pt's swallowing, oral motor movement/absence of movement, reviewed overt s/sx aspiration PNA, safest food to eat (puree), swallow precautions (small bites/sips, double swallow with all POs, oral care following POs). Pt entered room with "wet" voice, SLP told pt clear throat and swallow, and voice cleared. SLP noted delay of swallow trigger approx 5 seconds. SLP presented information on from ALS Association on changes in swallowing in ALS, and suggested wife and or look through and read for information and SLP can discuss ?s with pt/wife next session.  Pt brought McGriddle from [employer], a gogo squeeze, and an Ensure with him. SLP had to queue  patient x2 not to begin eating McGriddle while SLP explaining handout to pt/wife. Pt req'd usual initial cues for smaller bite (faded to rare min when pt did not suspect SLP was still watching him eat). Copious amounts of anterior labial leakage noted; pt had to take index finger to move bolus from rt-lt and lt-rt. Pt with vertical and not rotary chewing pattern, very light so that he was swallowing inadequately masticated solid bolus. [place] required SLP reiterated x3-4 during session that SLP would, based on what SLP was seeing today, likely be recommending feeding tube after objective swallow eval at ALS clinic in May, but that if pt wanted to cont to eat despite of knowing the risks of aspiration/aspiration PNA that was his decision. Pt cont to eat and drink.It is documented in pt's [facility] notes that pt does not wish PEG due to he could no longer enjoy the hot tub. SLP explained to pt/wife that pt is very likely aspirating but is active and PNA has not yet formed in pt's lungs. SLP educated wife that when pt coughs with meals, encourage HARD cough and reswallow. Guy Payne cued pt appropriately x4 during session today for this.  SLP stressed pureed and dys II foods today with pt and wife, and also ensuring meticulous oral care following POs - and SLP explained  rationale for this to pt/wife.  Lastly, SLP placed very small dabs of applesauce on pt's lt lower, rt lower, and middle of pt's lower lip without volitional lingual movement to those areas. SLP educated Guy Payne about volitional lingual movement vs. Reflexive movements.    06/02/22 (eval): SLP discussed eval results, tx plan, and other education below.  PATIENT EDUCATION: Education details: importance of oral care, thin liquids be done without meals, consider PEG tube to maximize safety, aspiration PNA s/sx Person educated: Patient and Spouse Education method: Explanation and Handouts Education comprehension: verbalized understanding and needs further education   ASSESSMENT:  CLINICAL IMPRESSION: Patient is a 72 y.o. male who was seen today for therapy for swallowing in light of pt's FTD-ALS. SEE TODAY'S NOTE FOR DETAILS. Pt is likely aspirating solids and liquids (SLP shared this with Guy Payne again today), but cont to not report any PNA or bronchitis or other pulmonary issues to date. Pt does not want feeding tube placed. Plan is to have FEES at next ALS clinic, per SLP Atha Starks. According to last ALS clinic note pt also has good oral care. Pt/wife were reminded of overt s/sx aspiration PNA today, and the need for stringent oral care was also strongly reiterated.  OBJECTIVE IMPAIRMENTS: include attention, memory, awareness, executive functioning, dysarthria, and dysphagia. These impairments are limiting patient from managing medications, managing appointments, managing finances, household responsibilities, ADLs/IADLs, effectively communicating at home and in community, and safety when swallowing. Factors affecting potential to achieve goals and functional outcome are ability to learn/carryover information, medical prognosis, previous level of function, and severity of impairments. Patient will benefit from skilled SLP services to address above impairments and improve overall function.  REHAB  POTENTIAL: Poor based upon medical diagnosis   GOALS: Goals reviewed with patient? Yes   LONG TERM GOALS: Target date: 07/04/22  Pt will demo incr'd lingual and/or labial movement given a gustatory stimulus on labial structures, x10/session x2 sessions Baseline:  Goal status: Ongoing  2.  Pt and/or wife will provide 3 overt s/sx aspiration PNA, in 2 sessions Baseline: 07/01/22 Goal status: Ongoing  3.  Pt will take information home about swallowing changes and nutrition management in  ALS, provided by SLP Baseline:  Goal status: Met   PLAN:  SLP FREQUENCY: 1x/week  SLP DURATION: 4 weeks  PLANNED INTERVENTIONS: Environmental controls, Oral motor exercises, SLP instruction and feedback, and Patient/family education    [place], CCC-SLP 07/01/2022, 12:26 PM

## 2022-07-04 ENCOUNTER — Ambulatory Visit: Payer: Medicare PPO | Admitting: Speech Pathology

## 2022-07-06 DIAGNOSIS — G1221 Amyotrophic lateral sclerosis: Secondary | ICD-10-CM | POA: Diagnosis not present

## 2022-07-10 DIAGNOSIS — G1221 Amyotrophic lateral sclerosis: Secondary | ICD-10-CM | POA: Diagnosis not present

## 2022-07-19 ENCOUNTER — Ambulatory Visit: Payer: Medicare PPO

## 2022-07-19 DIAGNOSIS — R471 Dysarthria and anarthria: Secondary | ICD-10-CM

## 2022-07-19 DIAGNOSIS — R41841 Cognitive communication deficit: Secondary | ICD-10-CM | POA: Diagnosis not present

## 2022-07-19 DIAGNOSIS — R1311 Dysphagia, oral phase: Secondary | ICD-10-CM | POA: Diagnosis not present

## 2022-07-19 DIAGNOSIS — R1312 Dysphagia, oropharyngeal phase: Secondary | ICD-10-CM

## 2022-07-19 NOTE — Addendum Note (Signed)
Addended by: Verdie Mosher B on: 07/19/2022 10:20 AM   Modules accepted: Orders

## 2022-07-19 NOTE — Therapy (Addendum)
OUTPATIENT SPEECH LANGUAGE PATHOLOGY SWALLOW TREATMENT/Recert-discharge   Patient Name: Guy Payne MRN: [id number] DOB:05-23-1950, 72 y.o., male Today's Date: 07/19/2022  PCP: Guy Poling, DO REFERRING PROVIDER: Jackelyn Poling, DO  END OF SESSION:  End of Session - 07/19/22 0833     Visit Number 5    Number of Visits 5    Date for SLP Re-Evaluation 07/19/22    SLP Start Time 0804    SLP Stop Time  0842    SLP Time Calculation (min) 38 min    Activity Tolerance Patient tolerated treatment well              Past Medical History:  Diagnosis Date   Fall 2021   MCI (mild cognitive impairment)    Memory loss    Prostate disorder    blockage   Restless leg    Past Surgical History:  Procedure Laterality Date   genital surgery     was not specific   NO PAST SURGERIES     prostate repair for blockage     Patient Active Problem List   Diagnosis Date Noted   Periodic limb movements of sleep 05/14/2015   Amnestic MCI, 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 Then he will be discharged.   Remaining deficits: All deficits remain.   Education / Equipment: See "today's treatment" sections below.   Patient agrees to discharge. Patient goals were partially met. Patient is being discharged due to being pleased with the current functional level..    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  Pt accompanied by: significant other wife, Guy Payne  PERTINENT HISTORY: Pt known to this clinician from 14 session course of ST in early 2022 targeting dysarthria, and cognitive communication, and briefly dysphagia. Pt has declined in function since that course.  Palliative care visit 06/01/22: "SW and Nurse-Guy Payne completed a visit with patient at his home. His wife-Guy Payne was present with him. Patient opened the door.  He is non-verbal, but he is able to write his thoughts/responses. Patient drools constantly. He denied pain. He report that he does have discomfort with his throat when he eats. He will choke on his saliva. Patient has a referral for swallowing through [facility] neurology. Patient had two falls last month.  He has difficulty and strain but now put Guy Payne daily. He has a BM every 1-2 days. Patient is on a soft food diet and eats 6-7 meals per day. His current weight is approximately 145 lbs. Patient receives medical massage therapy weekly. He goes to ALS clinic at [facility] every 3-4 months.  Patient ambulates independently.  Patient is independent for all personal care. Patient's behavior is spontaneous, jovial and can be aggressive Patient was born in [place]. He is a retired [employer] account executive. Patient has no [employer]  experience. He has been married 48 years. They have one son and four grandchildren. Patient is of Christian faith. He is in a men's Bible study group every Saturday.  Patient/wife have great support through their son, neighbors. His wife is able to do self-care, by line-dancing every Monday. He walks daily with the neighbors. [name] requested that patient be a DNR. The NP added to the visit virtually for advance care planning-DNR left in the  home.[name] serve as his POA/HCPOA."  MD note from last ALS clinic at [facility] (04/27/22): Assessment: 72 y.o. 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 He has had progression of dysarthria and dysphagia, though respiratory status is stable. Primary concern remains dysphagia, weight and behavioral changes/cognitive issues. Plan: -Continue Guy Payne 1 mg daily -Continue Guy Payne 10 mg QHS -Script for bathroom renovations can be given, though this may not provide any financial benefit. His wife will message with details. -Doesn't want PEG because he couldn't use hot tub -Follow-up in 3 months Electronically signed by: Guy Furry, MD 04/27/2022 5:15 PM  SLP note from last ALS clinic at [facility]: April 27, 2022 ALS CLINIC - Non-billable note Guy Payne was seen briefly to discuss his swallowing status. Known mod-severe oral dysphagia Reports more coughing during meals- occurs every day Good appetite and weight stable Does not want a feeding tube Risks of aspiration reviewed but patient has not had pneumonia, remains active and ambulatory and performs good oral care Interested in getting a swallow study at the next clinic he attends. FEES to be scheduled for his return date to clinic. Please contact me if there are any questions. Guy Payne, Med/CCC-SLP Speech Language Pathology Specialist [phone]   PAIN:  Are you having pain? No (indicates no)  FALLS: Has patient fallen in last 6 months?  Number of falls: 2   LIVING ENVIRONMENT: Lives with: lives with their spouse Lives in: House/apartment undergoing renovations in light of pt decline  PLOF:  Level of assistance: Needed assistance with ADLs, Needed assistance with IADLS Employment: Retired  PATIENT GOALS: Improve QOL  OBJECTIVE:    RECOMMENDATIONS FROM OBJECTIVE SWALLOW STUDY (MBSS/FEES):  See SLP note Guy Payne, SLP) from last ALS clinic. Pt to receive FEES next clinic visit. See note above for details.    COMMUNICATION: Written means were functional for pt to communicate. Pt is unable to verbally communicate at this time.   CLINICAL SWALLOW ASSESSMENT:   Current diet: Dysphagia 3 (mechanical soft) and thin liquids Dentition: adequate natural dentition Patient directly observed with POs: Yes: dysphagia 1 (puree) and thin liquids  Feeding: able to feed self and needs assist due to decr'd cognition/impulsivity Liquids provided by: cup Oral phase signs and symptoms: anterior loss/spillage and prolonged bolus formation; pt tilted head back to move applesauce posteriorly in oral cavity.  Pharyngeal phase signs and symptoms: suspected delayed swallow initiation, wet vocal quality, delayed throat clear, and delayed cough SLP assessed pt for lingual movement with POs and placed dot of applesauce on pt's rt labial margin, lt labial margin, and in the center of bottom lip. In all instances, pt's tongue made one fleeting movement approx 1mm forward in an attempt to reach for the dot in center of pt's bottom lip. No lateral movement was noted whatsoever, today.  PATIENT REPORTED OUTCOME MEASURES (PROM): Not provided this plan of care due to  pt desire to eat PO.    TODAY'S TREATMENT:                                                                                                                                         DATE:  07/19/22: Wife and pt cont with lingual exercises at home. Pt with persistent desire to eat PO and not have feeding tube, so SLP will not provide EAT-10 this plan of care. Pt entered with clear voice today. Brought mini-muffins which he used his index finger for bolus manipulation. Near-nectar protein drink, and mashed fruit with yogurt. Pt req'd initial usual min A for double swallows but faded to rare  min A by end of session. SLP reiterated swallow safety during meals with wife, need for meticulous oral care following meals/POs, and need for solely pureed with thickened liquids if pt becomes ill/weakened. Zyrion req'd cues for anterior labial leakage, rarely, other times he noted this and used napkin appropriately. Guy Payne cueing appropriately during session for double swallow, and for small bites. SLP reiterated to pt/wife that if after objective swallow assessment at ALS clinic in July they are welcome to return to therapy with a prescription, if pt and/or wife feel they need some guidance with navigating PO consumption.  07/01/22: SLP did not provide EAT-10. Will do this next session. Guy Payne shared that the palliative nurse told her this week that thickened liquids do not count for liquids during the day, due to their viscosity. She told the SLP that she was going to switch to juice or flavored waters for meals. SLP strongly advised against that , due to aspiration risk. SLP suggested instead that patient have thin liquids after stringent oral care at least twice daily. SLP took this time to reiterate the importance of stringent oral care both before meals, and after meals, and explained the rationale multiple times during the session today.Overt s/sx aspiration PNA reviewed today. Pt lingual ROM with pureed POs placed on pt's lower lip just medial to midline was unchanged from previous session. Guy Payne told SLP she completed this task with pt at home this week. With POs, pt req'd usual min-mod cues for multiple swallows, and usual min cues for finishing solids prior to drinking liquids, and occasional min cues for smaller sips. Pt took small bites with independence. Minimal lingual retraction during oral stage was noted and pt moved solid bolus posteriorly with what appeared to be mostly tongue rocking and tilting head. Anterior labial leakage noted consistently, and pt used napkin to wipe lower lip. SLP  again suggested pt consider PEG for maximizing pt safety and pt shook his head. Wife stated, "He doesn't want one." Pt demonstrated "Wet" voice with all initial swallows of nutrition shake (nectar consistency).   06/20/22: EAT-10 will be provided next session. Today Bohdi req'd consistent mod cues for double swallows due to impulsivity. "I'm not sure why he can't just  remember fo swallow it," Guy Payne stated. SLP shared again that due to pt's impulsivity he will likely always need cueing for him to slow down and finish what is in his mouth prior to administering next bite. Pt became better in terms of requiring less frequent cues (faded to occasional min-mod A) but con't to demo impulsive behavior while eating. Pt used finger for oral manipulation and used head tilt and tongue rocking to propel food to posterior oral cavity. SLP again ensured pt and wife know risk of pt eating POs at this time, and were educated on overt s/sx of aspiration PNA. SLP strongly urged pt and wife keep those s/sx in an important location should they need to consult them. Today, "wet" voice is pt's only concerning behavior/sx. Anterior labial leakage was mitigated by pt wiping his mouth 90% of the time. SLP placed applesauce on pt's rt and lt labial margins and no lingual lateral movement was noted, just reflexive tongue rocking behavior. When placed just lateral to medial position pt could remove >50% of applesauce with repeated lingual rocking/protrusion motion. SLP told pt wife to complete this at home with puree at least once/day.  06/15/22: SLP provided education for pt's wife the entire session about pt's swallowing, oral motor movement/absence of movement, reviewed overt s/sx aspiration PNA, safest food to eat (puree), swallow precautions (small bites/sips, double swallow with all POs, oral care following POs). Pt entered room with "wet" voice, SLP told pt clear throat and swallow, and voice cleared. SLP noted delay of swallow  trigger approx 5 seconds. SLP presented information on from ALS Association on changes in swallowing in ALS, and suggested wife and or look through and read for information and SLP can discuss ?s with pt/wife next session.  Pt brought Mc[employer]iddle from [employer], a gogo squeeze, and an Ensure with him. SLP had to queue patient x2 not to begin eating McGriddle while SLP explaining handout to pt/wife. Pt req'd usual initial cues for smaller bite (faded to rare min when pt did not suspect SLP was still watching him eat). Copious amounts of anterior labial leakage noted; pt had to take index finger to move bolus from rt-lt and lt-rt. Pt with vertical and not rotary chewing pattern, very light so that he was swallowing inadequately masticated solid bolus. Gottlieb required SLP reiterated x3-4 during session that SLP would, based on what SLP was seeing today, likely be recommending feeding tube after objective swallow eval at ALS clinic in May, but that if pt wanted to cont to eat despite of knowing the risks of aspiration/aspiration PNA that was his decision. Pt cont to eat and drink.It is documented in pt's [facility] notes that pt does not wish PEG due to he could no longer enjoy the hot tub. SLP explained to pt/wife that pt is very likely aspirating but is active and PNA has not yet formed in pt's lungs. SLP educated wife that when pt coughs with meals, encourage HARD cough and reswallow. Guy Payne cued pt appropriately x4 during session today for this.  SLP stressed pureed and dys II foods today with pt and wife, and also ensuring meticulous oral care following POs - and SLP explained rationale for this to pt/wife.  Lastly, SLP placed very small dabs of applesauce on pt's lt lower, rt lower, and middle of pt's lower lip without volitional lingual movement to those areas. SLP educated Guy Payne about volitional lingual movement vs. Reflexive movements.    06/02/22 (eval): SLP discussed eval results, tx plan, and other  education below.  PATIENT EDUCATION: Education details: importance of oral care, thin liquids be done without meals, consider PEG tube to maximize safety, aspiration PNA s/sx Person educated: Patient and Spouse Education method: Explanation and Handouts Education comprehension: verbalized understanding and needs further education   ASSESSMENT:  CLINICAL IMPRESSION: RECERT-D/C today. Patient is a 72 y.o. male who was seen today for therapy for swallowing in light of pt's FTD-ALS. SEE TODAY'S NOTE FOR DETAILS. Pt is likely aspirating solids and liquids (SLP shared this with Guy Payne again today), but cont to not report any PNA or bronchitis or other pulmonary issues to date. Pt does not want feeding tube placed. Plan is to have FEES at next ALS clinic, per SLP Guy Payne. According to last ALS clinic note pt also has good oral care. Pt/wife were reminded of overt s/sx aspiration PNA today, and the need for stringent oral care was also strongly reiterated.  OBJECTIVE IMPAIRMENTS: include attention, memory, awareness, executive functioning, dysarthria, and dysphagia. These impairments are limiting patient from managing medications, managing appointments, managing finances, household responsibilities, ADLs/IADLs, effectively communicating at home and in community, and safety when swallowing. Factors affecting potential to achieve goals and functional outcome are ability to learn/carryover information, medical prognosis, previous level of function, and severity of impairments. Patient will benefit from skilled SLP services to address above impairments and improve overall function.  REHAB POTENTIAL: Poor based upon medical diagnosis   GOALS: Goals reviewed with patient? Yes   LONG TERM GOALS: Target date: 07/04/22  Pt will demo incr'd lingual and/or labial movement given a gustatory stimulus on labial structures, x10/session x2 sessions Baseline:  Goal status: not met  2.  Pt and/or wife will  provide 3 overt s/sx aspiration PNA, in 2 sessions Baseline: 07/01/22 Goal status: Met  3.  Pt will take information home about swallowing changes and nutrition management in ALS, provided by SLP Baseline:  Goal status: Met   PLAN: Recert and d/c today.  PLANNED INTERVENTIONS: Environmental controls, Oral motor exercises, SLP instruction and feedback, and Patient/family education    [facility], CCC-SLP 07/19/2022, 10:00 AM

## 2022-07-22 DIAGNOSIS — R69 Illness, unspecified: Secondary | ICD-10-CM | POA: Diagnosis not present

## 2022-07-26 DIAGNOSIS — G1221 Amyotrophic lateral sclerosis: Secondary | ICD-10-CM | POA: Diagnosis not present

## 2022-08-08 DIAGNOSIS — G1221 Amyotrophic lateral sclerosis: Secondary | ICD-10-CM | POA: Diagnosis not present

## 2022-08-10 DIAGNOSIS — G1221 Amyotrophic lateral sclerosis: Secondary | ICD-10-CM | POA: Diagnosis not present

## 2022-08-23 ENCOUNTER — Other Ambulatory Visit: Payer: Medicare PPO

## 2022-08-24 DIAGNOSIS — R638 Other symptoms and signs concerning food and fluid intake: Secondary | ICD-10-CM | POA: Diagnosis not present

## 2022-08-24 DIAGNOSIS — Z9189 Other specified personal risk factors, not elsewhere classified: Secondary | ICD-10-CM | POA: Diagnosis not present

## 2022-08-24 DIAGNOSIS — R1319 Other dysphagia: Secondary | ICD-10-CM | POA: Diagnosis not present

## 2022-08-24 DIAGNOSIS — E638 Other specified nutritional deficiencies: Secondary | ICD-10-CM | POA: Diagnosis not present

## 2022-08-24 DIAGNOSIS — R1312 Dysphagia, oropharyngeal phase: Secondary | ICD-10-CM | POA: Diagnosis not present

## 2022-08-24 DIAGNOSIS — G1221 Amyotrophic lateral sclerosis: Secondary | ICD-10-CM | POA: Diagnosis not present

## 2022-08-24 DIAGNOSIS — Z008 Encounter for other general examination: Secondary | ICD-10-CM | POA: Diagnosis not present

## 2022-08-26 DIAGNOSIS — G1221 Amyotrophic lateral sclerosis: Secondary | ICD-10-CM | POA: Diagnosis not present

## 2022-09-03 DIAGNOSIS — G1221 Amyotrophic lateral sclerosis: Secondary | ICD-10-CM | POA: Diagnosis not present

## 2022-09-10 DIAGNOSIS — G1221 Amyotrophic lateral sclerosis: Secondary | ICD-10-CM | POA: Diagnosis not present

## 2022-09-22 DIAGNOSIS — G1221 Amyotrophic lateral sclerosis: Secondary | ICD-10-CM | POA: Diagnosis not present

## 2022-10-01 DIAGNOSIS — G1221 Amyotrophic lateral sclerosis: Secondary | ICD-10-CM | POA: Diagnosis not present

## 2022-10-04 DIAGNOSIS — K581 Irritable bowel syndrome with constipation: Secondary | ICD-10-CM | POA: Diagnosis not present

## 2022-10-04 DIAGNOSIS — G309 Alzheimer's disease, unspecified: Secondary | ICD-10-CM | POA: Diagnosis not present

## 2022-10-04 DIAGNOSIS — G1222 Progressive bulbar palsy: Secondary | ICD-10-CM | POA: Diagnosis not present

## 2022-10-04 DIAGNOSIS — E785 Hyperlipidemia, unspecified: Secondary | ICD-10-CM | POA: Diagnosis not present

## 2022-10-04 DIAGNOSIS — G3109 Other frontotemporal dementia: Secondary | ICD-10-CM | POA: Diagnosis not present

## 2022-10-04 DIAGNOSIS — N529 Male erectile dysfunction, unspecified: Secondary | ICD-10-CM | POA: Diagnosis not present

## 2022-10-04 DIAGNOSIS — G1221 Amyotrophic lateral sclerosis: Secondary | ICD-10-CM | POA: Diagnosis not present

## 2022-10-04 DIAGNOSIS — Z8249 Family history of ischemic heart disease and other diseases of the circulatory system: Secondary | ICD-10-CM | POA: Diagnosis not present

## 2022-10-04 DIAGNOSIS — F919 Conduct disorder, unspecified: Secondary | ICD-10-CM | POA: Diagnosis not present

## 2022-10-08 DIAGNOSIS — G1221 Amyotrophic lateral sclerosis: Secondary | ICD-10-CM | POA: Diagnosis not present

## 2022-10-18 DIAGNOSIS — G1221 Amyotrophic lateral sclerosis: Secondary | ICD-10-CM | POA: Diagnosis not present

## 2022-11-08 DIAGNOSIS — G1221 Amyotrophic lateral sclerosis: Secondary | ICD-10-CM | POA: Diagnosis not present

## 2022-11-23 DIAGNOSIS — G1221 Amyotrophic lateral sclerosis: Secondary | ICD-10-CM | POA: Diagnosis not present

## 2022-11-29 DIAGNOSIS — G1221 Amyotrophic lateral sclerosis: Secondary | ICD-10-CM | POA: Diagnosis not present

## 2022-12-06 DIAGNOSIS — R296 Repeated falls: Secondary | ICD-10-CM | POA: Diagnosis not present

## 2022-12-06 DIAGNOSIS — Z6822 Body mass index (BMI) 22.0-22.9, adult: Secondary | ICD-10-CM | POA: Diagnosis not present

## 2022-12-06 DIAGNOSIS — E782 Mixed hyperlipidemia: Secondary | ICD-10-CM | POA: Diagnosis not present

## 2022-12-06 DIAGNOSIS — Z9181 History of falling: Secondary | ICD-10-CM | POA: Diagnosis not present

## 2022-12-07 DIAGNOSIS — G1221 Amyotrophic lateral sclerosis: Secondary | ICD-10-CM | POA: Diagnosis not present

## 2022-12-23 DIAGNOSIS — R531 Weakness: Secondary | ICD-10-CM | POA: Diagnosis not present

## 2022-12-23 DIAGNOSIS — R296 Repeated falls: Secondary | ICD-10-CM | POA: Diagnosis not present

## 2022-12-23 DIAGNOSIS — G1221 Amyotrophic lateral sclerosis: Secondary | ICD-10-CM | POA: Diagnosis not present

## 2022-12-28 DIAGNOSIS — R29898 Other symptoms and signs involving the musculoskeletal system: Secondary | ICD-10-CM | POA: Diagnosis not present

## 2022-12-28 DIAGNOSIS — G1220 Motor neuron disease, unspecified: Secondary | ICD-10-CM | POA: Diagnosis not present

## 2022-12-28 DIAGNOSIS — Z7409 Other reduced mobility: Secondary | ICD-10-CM | POA: Diagnosis not present

## 2022-12-28 DIAGNOSIS — G3109 Other frontotemporal dementia: Secondary | ICD-10-CM | POA: Diagnosis not present

## 2022-12-28 DIAGNOSIS — G1221 Amyotrophic lateral sclerosis: Secondary | ICD-10-CM | POA: Diagnosis not present

## 2022-12-28 DIAGNOSIS — Z789 Other specified health status: Secondary | ICD-10-CM | POA: Diagnosis not present

## 2023-01-03 DIAGNOSIS — R296 Repeated falls: Secondary | ICD-10-CM | POA: Diagnosis not present

## 2023-01-03 DIAGNOSIS — R531 Weakness: Secondary | ICD-10-CM | POA: Diagnosis not present

## 2023-01-03 DIAGNOSIS — G1221 Amyotrophic lateral sclerosis: Secondary | ICD-10-CM | POA: Diagnosis not present

## 2023-01-20 DIAGNOSIS — R531 Weakness: Secondary | ICD-10-CM | POA: Diagnosis not present

## 2023-01-20 DIAGNOSIS — R296 Repeated falls: Secondary | ICD-10-CM | POA: Diagnosis not present

## 2023-01-20 DIAGNOSIS — G1221 Amyotrophic lateral sclerosis: Secondary | ICD-10-CM | POA: Diagnosis not present

## 2023-02-07 DIAGNOSIS — G1221 Amyotrophic lateral sclerosis: Secondary | ICD-10-CM | POA: Diagnosis not present

## 2023-02-07 DIAGNOSIS — R296 Repeated falls: Secondary | ICD-10-CM | POA: Diagnosis not present

## 2023-02-07 DIAGNOSIS — R531 Weakness: Secondary | ICD-10-CM | POA: Diagnosis not present

## 2023-02-08 DIAGNOSIS — G1221 Amyotrophic lateral sclerosis: Secondary | ICD-10-CM | POA: Diagnosis not present

## 2023-02-16 ENCOUNTER — Ambulatory Visit: Payer: Medicare PPO | Attending: Family Medicine

## 2023-02-16 DIAGNOSIS — M6281 Muscle weakness (generalized): Secondary | ICD-10-CM | POA: Diagnosis not present

## 2023-02-16 DIAGNOSIS — R278 Other lack of coordination: Secondary | ICD-10-CM | POA: Insufficient documentation

## 2023-02-16 DIAGNOSIS — R2681 Unsteadiness on feet: Secondary | ICD-10-CM | POA: Insufficient documentation

## 2023-02-16 NOTE — Therapy (Signed)
OUTPATIENT PHYSICAL THERAPY NEURO EVALUATION   Patient Name: Guy Payne MRN: [id number] DOB:25-Jan-1951, 72 y.o., male Today's Date: 02/16/2023   PCP: Jackelyn Poling, DO REFERRING PROVIDER: Jackelyn Poling, DO  END OF SESSION:  PT End of Session - 02/16/23 0803     Visit Number 1    Number of Visits 7    Date for PT Re-Evaluation 04/14/23    Authorization Type [employer]    Progress Note Due on Visit 10    PT Start Time 0802    PT Stop Time 0843    PT Time Calculation (min) 41 min    Equipment Utilized During Treatment Gait belt    Activity Tolerance Patient tolerated treatment well    Behavior During Therapy Impulsive             Past Medical History:  Diagnosis Date   Fall 2021   MCI (mild cognitive impairment)    Memory loss    Prostate disorder    blockage   Restless leg    Past Surgical History:  Procedure Laterality Date   genital surgery     was not specific   NO PAST SURGERIES     prostate repair for blockage     Patient Active Problem List   Diagnosis Date Noted   Periodic limb movements of sleep 05/14/2015   Amnestic MCI, 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) 11/16/2014   Glucose intolerance (impaired glucose tolerance) 11/16/2014    ONSET DATE: 01/27/23 referral  REFERRING DIAG:  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 History slow and limited given patients need to communicate via iPad. Wife then joined in the clinic to supplement history. Reports numerous falls, missing curbs, falling backwards down steps carrying things. Wife reports that it has not been suggested for him to use an AD. Pt accompanied by: significant other  PERTINENT HISTORY: ALS, MCI  PAIN:  Are you having pain? No  PRECAUTIONS: Fall and Other: aphasia  WEIGHT BEARING RESTRICTIONS:  No  FALLS: Has patient fallen in last 6 months? Yes. Number of falls 6-7  LIVING ENVIRONMENT: Lives with: lives with their family Lives in: House/apartment Stairs: Yes: Internal: flight steps; on right going up and External: 2 steps; none Has following equipment at home: Grab bars  PLOF: Independent and Needs assistance with homemaking  PATIENT GOALS: "to not fall"   OBJECTIVE:  Note: Objective measures were completed at Evaluation unless otherwise noted.   COGNITION: Overall cognitive status: History of cognitive impairments - at baseline   SENSATION: WFL  POSTURE: No Significant postural limitations   BED MOBILITY:  independent  TRANSFERS: Assistive device utilized: None  Sit to stand: Modified independence Stand to sit: Modified independence Chair to chair: Modified independence   STAIRS: Level of Assistance: SBA Stair Negotiation Technique: Alternating Pattern  with Bilateral Rails Number of Stairs: 4  Height of Stairs: 6   GAIT: Gait pattern: step through pattern, decreased arm swing- Right, decreased arm swing- Left, wide BOS, poor foot clearance- Right, and poor foot clearance- Left Distance walked: clinic Assistive device utilized: None Level of assistance: Modified independence and SBA Comments: often changing speed, intentionally walking toward obstacles, generally unsafe  FUNCTIONAL TESTS:   [facility] PT Assessment - 02/16/23 0001       Standardized Balance Assessment   Standardized Balance Assessment Timed  Up and Go Test    Five times sit to stand comments  20.81    10 Meter Walk 1.39m/s      Timed Up and Go Test   Normal TUG (seconds) 9.06      Functional Gait  Assessment   Gait assessed  Yes    Gait Level Surface Walks 20 ft in less than 7 sec but greater than 5.5 sec, uses assistive device, slower speed, mild gait deviations, or deviates 6-10 in outside of the 12 in walkway width.    Change in Gait Speed Able to change speed, demonstrates mild gait deviations, deviates 6-10 in outside of the 12 in walkway width, or no gait deviations, unable to achieve a major change in velocity, or uses a change in velocity, or uses an assistive device.    Gait with Horizontal Head Turns Performs head turns smoothly with slight change in gait velocity (eg, minor disruption to smooth gait path), deviates 6-10 in outside 12 in walkway width, or uses an assistive device.    Gait with Vertical Head Turns Performs task with slight change in gait velocity (eg, minor disruption to smooth gait path), deviates 6 - 10 in outside 12 in walkway width or uses assistive device    Gait and Pivot Turn Pivot turns safely within 3 sec and stops quickly with no loss of balance.    Step Over Obstacle Is able to step over one shoe box (4.5 in total height) without changing gait speed. No evidence of imbalance.    Gait with Narrow Base of Support Is able to ambulate for 10 steps heel to toe with no staggering.    Gait with Eyes Closed Walks 20 ft, uses assistive device, slower speed, mild gait deviations, deviates 6-10 in outside 12 in walkway width. Ambulates 20 ft in less than 9 sec but greater than 7 sec.    Ambulating Backwards Walks 20 ft, no assistive devices, good speed, no evidence for imbalance, normal gait    Steps Alternating feet, must use rail.    Total Score 23  TREATMENT  -assisted cough, safety precautions with  minimizing dual tasking, use of AD for safety   PATIENT EDUCATION: Education details: PT POC, exam findings, limiting multi tasking, assisted cough, use of AD for safety Person educated: Patient and Spouse Education method: Explanation and Handouts Education comprehension: verbalized understanding and needs further education  HOME EXERCISE PROGRAM: To be provided  GOALS: Goals reviewed with patient? Yes  SHORT TERM GOALS: Target date: 03/17/23  Pt will be independent with initial HEP for improved safety awareness,  functional strength, balance Baseline: to be provided Goal status: INITIAL  2.  Pt will improve 5x STS to </= 16 sec to demo improved functional LE strength and balance   Baseline: 20.81s B UE Goal status: INITIAL    LONG TERM GOALS: Target date: 04/14/23  Pt will be independent with final HEP for improved safety awareness, functional strength, balance  Baseline:  to be provided Goal status: INITIAL  2.  Pt will improve 5x STS to </= 13 sec to demo improved functional LE strength and balance   Baseline: 20.81s B UE Goal status: INITIAL  3.  Pt will improve FGA to >/= 25/30 to demonstrate improved balance and reduced fall risk  Baseline: 23/30 Goal status: INITIAL  4.  Pt will improve gait speed to >/= 1.41m/s  to demonstrate improved community ambulation  Baseline: 1.15m/s no AD Goal status: INITIAL   ASSESSMENT:  CLINICAL IMPRESSION: Patient is a 72 y.o. 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 Patient impulsive and inappropriate throughout evaluation. Initially throwing himself onto therapy mat to lay down when instructed to sit in arm chair, then, despite instructions not to, demonstrating how he loses his balance frequently forward and backward stumbling toward PT and nearly landing in this writers lap. When PT was placing gait belt around patients waist, patient  inappropriately placing his hands over this writers waist. He has an ipad, but chose to communicate primarily through gestures and deferring to his wife. He is at a significant risk for subsequent falls, but not necessarily due to impaired strength or balance, but more poor safety awareness and insight. Patient alluding to the fact that he was aware of his poor memory/cognition/insight leading this PT to believe that at times his behavior is a conscious choice and not a result of his diagnosis. His wife would benefit from education on when/how to cue patient for his safety to limit his falls. Patient reports that he will not use an AD if it is recommended that he do so. Patient scored a 23/30 on  Functional Gait Assessment.   <22/30 = predictive of falls, <20/30 = fall in 6 months, <18/30 = predictive of falls in PD MCID: 5 points stroke population, 4 points geriatric population (ANPTA Core Set of Outcome Measures for Adults with Neurologic Conditions, 2018). Five times Sit to Stand Test (FTSS) Method: Use a straight back chair with a solid seat that is 17-18" high. Ask participant to sit on the chair with arms folded across their chest.   Instructions: "Stand up and sit down as quickly as possible 5 times, keeping your arms folded across your chest."   Measurement: Stop timing when the participant touches the chair in sitting the 5th time.  TIME: 20.81 sec  Cut off scores indicative of increased fall risk: >12 sec CVA, >16 sec PD, >13 sec vestibular (ANPTA Core Set of Outcome Measures for Adults with Neurologic Conditions, 2018). 10  Meter Walk Test: Patient instructed to walk 10 meters (32.8 ft) as quickly and as safely as possible at their normal speed x2 and at a fast speed x2. Time measured from 2 meter mark to 8 meter mark to accommodate ramp-up and ramp-down.  Normal speed: 1.47m/s Cut off scores: <0.4 m/s = household Ambulator, 0.4-0.8 m/s = limited community Ambulator, >0.8 m/s = community  Ambulator, >1.2 m/s = crossing a street, <1.0 = increased fall risk MCID 0.05 m/s (small), 0.13 m/s (moderate), 0.06 m/s (significant)  (ANPTA Core Set of Outcome Measures for Adults with Neurologic Conditions, 2018). Patient completed the Timed Up and Go test (TUG) in 9.06 seconds.  Geriatrics: need for further assessment of fall risk: >/= 12 sec; Recurrent falls: > 15 sec; Vestibular Disorders fall risk: > 15 sec; Parkinson's Disease fall risk: > 16 sec ([email or web address], 2023). PT provided patient and wife with print outs on assisted cough techniques given patients wet quality of his breathing and reports of semi-frequent aspiration. He would benefit from skilled PT services to address the above mentioned deficits.    OBJECTIVE IMPAIRMENTS: Abnormal gait, decreased balance, decreased cognition, decreased knowledge of condition, decreased knowledge of use of DME, decreased safety awareness, and impaired perceived functional ability.   ACTIVITY LIMITATIONS: carrying, lifting, stairs, locomotion level, and caring for others  PARTICIPATION LIMITATIONS: meal prep, cleaning, interpersonal relationship, and community activity  PERSONAL FACTORS: Age, Behavior pattern, Time since onset of injury/illness/exacerbation, and Transportation are also affecting patient's functional outcome.   REHAB POTENTIAL: Fair dx, time since onset  CLINICAL DECISION MAKING: Evolving/moderate complexity  EVALUATION COMPLEXITY: Moderate  PLAN:  PT FREQUENCY: 1x/week  PT DURATION: 6 weeks  PLANNED INTERVENTIONS: 97164- PT Re-evaluation, 97110-Therapeutic exercises, 97530- Therapeutic activity, 97112- Neuromuscular re-education, 97535- Self Care, 09811- Manual therapy, [phone]- Gait training, [phone]- Orthotic Fit/training, [phone]- Aquatic Therapy, Patient/Family education, Balance training, Stair training, Vestibular training, Visual/preceptual remediation/compensation, Cognitive remediation, and DME instructions  PLAN FOR  NEXT SESSION: HEP, address safety awareness, FAMILY EDUCATION RE: NEED FOR SUPPORT WITH SAFETY, use of AD for safety   Westley Foots, PT Westley Foots, PT, DPT, CBIS  02/16/2023, 9:26 AM

## 2023-02-21 ENCOUNTER — Other Ambulatory Visit: Payer: Self-pay

## 2023-02-21 ENCOUNTER — Ambulatory Visit: Payer: Medicare PPO | Admitting: Occupational Therapy

## 2023-02-21 DIAGNOSIS — R278 Other lack of coordination: Secondary | ICD-10-CM

## 2023-02-21 DIAGNOSIS — M6281 Muscle weakness (generalized): Secondary | ICD-10-CM

## 2023-02-21 DIAGNOSIS — R2681 Unsteadiness on feet: Secondary | ICD-10-CM | POA: Diagnosis not present

## 2023-02-21 NOTE — Therapy (Signed)
 OUTPATIENT OCCUPATIONAL THERAPY NEURO EVALUATION  Patient Name: Guy Payne MRN: [id number] DOB:1950-05-11, 72 y.o., male Today's Date: 02/21/2023  PCP: Dayna Motto, DO  REFERRING PROVIDER: Dayna Motto, DO    END OF SESSION:  OT End of Session - 02/21/23 1606     Visit Number 1    Number of Visits 7   including eval   Date for OT Re-Evaluation 04/21/23    Authorization Type [employer] MC 2024, VL: [place]  Auth Reqd    Progress Note Due on Visit 10    OT Start Time 1320    OT Stop Time 1400    OT Time Calculation (min) 40 min    Activity Tolerance Patient tolerated treatment well    Behavior During Therapy Impulsive             Past Medical History:  Diagnosis Date   Fall 2021   MCI (mild cognitive impairment)    Memory loss    Prostate disorder    blockage   Restless leg    Past Surgical History:  Procedure Laterality Date   genital surgery     was not specific   NO PAST SURGERIES     prostate repair for blockage     Patient Active Problem List   Diagnosis Date Noted   Periodic limb movements of sleep 05/14/2015   Amnestic MCI, 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) 11/16/2014   Glucose intolerance (impaired glucose tolerance) 11/16/2014    ONSET DATE: 02/08/23 (referral date)  REFERRING DIAG:  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   Pt and pt's spouse reported difficulty with speech, moving fingers, manipulating buttons/zippers, chewing,  and sleeping. Pt wrote my tongue will not move.  Pt's spouse reported pt is having difficulty with hydration and asked about medication options. OT recommended to pt and pt's spouse to f/u with MD about medication questions. OT educated pt and pt's spouse on OT role and scope of practice. Pt's spouse verbalized understanding.  OT educated pt and pt's spouse on option for SLP referral to address chewing and swallowing concerns. Pt's spouse reported pt previously received SLP services and stated they've done all they can and there's nothing different each time we've went. OT educated pt and pt's spouse on SLP option and to f/u with MD for referral if pt and pt's spouse decide to seek SLP referral. Pt's spouse verbalized understanding.  Pt accompanied by: self and significant other  PERTINENT HISTORY: ALS, MCI, Per 02/16/23 PT eval, pt demo'd decreased safety awareness and insight, demo'd wet quality of pt's breathing and reported semi-frequent aspiration, and demo'd inappropriate behavior and impulsiveness  PRECAUTIONS: Fall and Other: aphasia , impulsivity  WEIGHT BEARING RESTRICTIONS: No  PAIN:  Are you having pain? No pain  FALLS: Has patient fallen in last 6 months? Yes. Number of falls 6-7  LIVING ENVIRONMENT:  Lives with: lives with their family Lives in: House/apartment Stairs: Yes: Internal: flight steps; on left going up and External: 2 steps; none Has following equipment at home: Grab bars  PLOF: Independent  and Needs assistance with homemaking, pt requires soft consistency of foods  PATIENT GOALS: Per pt's spouse, to be able to button clothes of pants. Per pt handwriting message: getting harder to write.  OBJECTIVE:  Note: Objective measures were completed at Evaluation unless otherwise noted.  HAND DOMINANCE: Right  ADLs: Overall ADLs: assistance Eating: Pt's spouse reported increased difficulty opening jars/containers. Pt's spouse reported difficulty with  controlling drool. Pt's spouse reported pt can bring eating utensils and cup to mouth, working on pt taking smaller bites. Pt reported through handwritten message easy to get food in but hard to swallow. Grooming: Per pt, no problem. Per pt's spouse, some issues with swallowing when toothbrushing though action of toothbrushing unaffected. UB Dressing: some assistance with donning jacket, difficulty with zippers/buttons, more frequently wearing elastic waist pants to avoid buttoning LB Dressing: some assistance with donning pants and socks, difficulty with tying laces Toileting: ind, pt's spouse reported pt having issues with constipation Bathing: ind with grab bars Tub Shower transfers: ind, slower Equipment: Grab bars and Walk in shower, raised commode, shower bench in upstairs shower  Pt uses hot tub 3-4x per day and pt's spouse reported avoiding getting feeding tube for now d/t pt's preference for hot tub.  IADLs: dependent  Handwriting: 100% legible, Pt reported handwriting is harder d/t difficulty grasping pen.  MOBILITY STATUS: Independent without A/E, decreased attention to safety  POSTURE COMMENTS:  Ind with sitting balance  ACTIVITY TOLERANCE: Activity tolerance: Pt gestured: Pt frequently taking naps, 2-3 per day.  FUNCTIONAL OUTCOME MEASURES: PSFS  = 3.3    UPPER EXTREMITY ROM:    Active ROM Right eval Left eval  Shoulder flexion [facility]  Shoulder abduction    Shoulder adduction    Shoulder extension    Shoulder internal rotation    Shoulder external rotation    Elbow flexion    Elbow extension    Wrist flexion    Wrist extension    Wrist ulnar deviation    Wrist radial deviation    Wrist pronation    Wrist supination    (Blank rows = not tested)   HAND FUNCTION: Grip strength: Right: 16.5, 17.1, 18.2 (17.2 lbs average) lbs; Left: 24.0, 20.9, 20.9 (21.9 lbs average) lbs  COORDINATION: 9 Hole Peg test: Right: 60 sec - not standardized d/t pt not  following verbal instructions to move one peg at a time despite continuous v/c. Left: did not test d/t pt demo'ing difficulty following verbal instructions.  OT noted ataxic movements and frequent drops with R UE during 9-hole peg test. Pt sometimes used LUE to stabilize peg in RUE despite verbal instructions and continuous v/c to use one hand only during 9-hole peg testing.  OT instead assessed box and blocks testing for coordination:  Box and Blocks:  LUE - 54 blocks with continuous v/c to move one block at a time. Pt attended to v/c. RUE - 55 blocks with fading v/c.  Pt's spouse reported pt has difficulty with bear claw motion (MCP ext, PIP flexed, DIP flexed) though no difficulty with composite fist. OT educated pt and spouse on FM coordination deficits compared to intact ROM and pt's spouse acknowledged understanding.  SENSATION: impaired  EDEMA: none noted  MUSCLE TONE: RUE: Within functional limits and LUE: Within functional limits  COGNITION: Overall cognitive status: Impaired  Overall cognitive status: History of cognitive impairments - at baseline   Pt demo'd impulsive and inappropriate behavior today. When pt Guy Payne) exited from restroom at beginning of OT  session, Guy Payne intercepted another individual who was walking in hallway with walker. Despite continuous v/c from OT and Guy Payne's wife, Guy Payne stepped in the individual's path, tapped the individual on shoulder several times, and attempted to grab the individual's hand to give the individual a high-five and handshake without attending to the safety and fall risk of either Guy Payne and the individual with the walker. With repeated v/c, Guy Payne continued towards clinic treatment area. Guy Payne attempted to hold OT 's hand and tap OT on shoulder. With stern and repeated v/c to Please keep hands to yourself, Guy Payne attended to v/c.  For remainder of evaluation, Guy Payne generally abided by repeated v/c. Guy Payne sometimes blew kisses towards his spouse  and towards OT though likely in jest. Guy Payne sometimes attempted to tap OT 's shoulder to get OT 's attention secondary to nonverbal communication methods. Following v/c to Please keep hands to self, Guy Payne instead began tapping tabletop to get OT 's attention. While seated in a chair with armrests, Guy Payne frequently threw himself to R side in order to hug pt's spouse on pt's R side, indicating decreased insight into safety and decreased understanding of fall risk.  VISION: not tested  PERCEPTION: Not tested  PRAXIS: Not tested  OBSERVATIONS: Pt accompanied by spouse. Pt ambulated ind without A/E. Pt demo'd some instances of impulsivity, inappropriate behavior, and decreased insight into safety and deficits. See Cognition section above in Objective Measures for additional information.  Pt nonverbal. To communicate, pt often used gestures, deferred to spouse to further explain, and later handwrote some responses on a piece of paper. Pt demo'd good legibility for handwriting in order to effectively communicate pt's thoughts to OT.                                                                                                                            TREATMENT DATE:   Eval only  PATIENT EDUCATION: Education details: OT role, OT POC, OT scope of practice, safety, keeping hands to self Person educated: Patient and Spouse Education method: Explanation, Tactile cues, and Verbal cues Education comprehension: verbalized understanding and needs further education  HOME EXERCISE PROGRAM: TBD   GOALS: Goals reviewed with patient? Yes  SHORT TERM GOALS: Target date: 03/24/23  Patient will demonstrate initial BUE HEP with 25% verbal cues or less for proper execution.  Baseline: new to outpt OT Goal status: INITIAL  2.  Pt and pt's spouse will acknowledge understanding of sleep positioning and sleep hygiene strategies to improve sleep quality. Baseline: Pt reported difficulty with sleep Goal  status: INITIAL  3.  Patient will demo maintained FM coordination for handwriting considerations as evidenced by completing nine-hole peg with use of RUE only in 65 seconds or less while attending to v/c to move one peg at a time using only RUE. Baseline: 9 Hole Peg test: Right: 60 sec - not standardized d/t pt not following verbal instructions to move one peg at a time despite continuous v/c.  Goal status: INITIAL  4.  Patient will maintain grip strength of BUE as evidenced by at least 17 lbs RUE grip strength and 21 lbs LUE grip strength as needed to open jars and other containers.  Baseline: Grip strength: Right: 16.5, 17.1, 18.2 (17.2 lbs average) lbs; Left: 24.0, 20.9, 20.9 (21.9 lbs average) lbs Goal status: INITIAL  5.  Pt and pt's spouse will verbalize understanding of shower chair and other bathing A/E options to decrease fall risk and improve safety for bathing ADL tasks. Baseline: Pt currently does not use shower chair. Pt's spouse expressed interest in obtaining shower chair. OT educated pt and pt's spouse to call insurance company to discuss options which are covered. OT educated pt and pt's spouse on option to review examples of shower chairs and transfer tub benches at a future OT session. Pt's spouse verbalized understanding. Goal status: INITIAL   LONG TERM GOALS: Target date: 04/21/23  Patient will demonstrate updated BUE HEP with 25% verbal cues or less for proper execution.  Baseline: new to outpt OT Goal status: INITIAL  2.  Pt will demo slightly improved FM coordination as evidenced by placing at least 57 blocks using B hand with completion of Box and Blocks test.  Baseline: Box and Blocks:  LUE - 54 blocks with continuous v/c to move one block at a time. Pt attended to v/c. RUE - 55 blocks with fading v/c. Goal status: INITIAL  3.  Pt and pt's spouse will verbalize understanding of A/E and adaptive strategies to improve ind for dressing tasks, including zippers,  buttons, and shoelaces. Baseline: Pt has increased difficulty with dressing tasks and manipulating buttons/zippers/shoelaces. Goal status: INITIAL  4.  Patient will report at least two-point increase in average PSFS score or at least three-point increase in a single activity score indicating functionally significant improvement given minimum detectable change. Baseline: PSFS = 3.3 total score (See above for individual activity scores)  Goal status: INITIAL   ASSESSMENT:  CLINICAL IMPRESSION: Patient is a 72 y.o. male who was seen today for occupational therapy evaluation for ALS, Alzheimer's disease, and repeated falls. Hx includes ALS, MCI. During OT evaluation today, pt demo'd decreased safety awareness and insight and demo'd inappropriate behavior and impulsiveness.  Please see Cognition section above in Objective Measures for additional information related to decreased safety awareness and impulsive and inappropriate behavior. Pt required frequent repetition of v/c and verbal instructions throughout eval today. Patient currently demonstrates functional deficits and impairments as noted below. Pt would benefit from skilled OT services in the outpatient setting to work on impairments as noted below to help maintain BUE strength and FM coordination, to improve ind for ADLs using adaptive strategies and A/E PRN, and to improve functional use of BUE.  PERFORMANCE DEFICITS: in functional skills including ADLs, IADLs, coordination, dexterity, proprioception, sensation, ROM, strength, flexibility, Fine motor control, Gross motor control, mobility, balance, body mechanics, endurance, decreased knowledge of precautions, decreased knowledge of use of DME, and UE functional use, cognitive skills including attention, energy/drive, learn, memory, safety awareness, sequencing, temperament/personality, and understand, and psychosocial skills including environmental adaptation, habits, interpersonal  interactions, and routines and behaviors.   IMPAIRMENTS: are limiting patient from ADLs, IADLs, rest and sleep, leisure, and social participation.   CO-MORBIDITIES: may have co-morbidities  that affects occupational performance. Patient will benefit from skilled OT to address above impairments and improve overall function.  MODIFICATION OR ASSISTANCE TO COMPLETE EVALUATION: Min-Moderate modification of tasks or assist with assess necessary to complete an evaluation.  OT OCCUPATIONAL PROFILE AND HISTORY: Detailed assessment: Review of records and additional review of physical, cognitive, psychosocial history related to current functional performance.  CLINICAL DECISION MAKING: Moderate - several treatment options, min-mod task modification necessary  REHAB POTENTIAL: Fair d/t progressive nature of condition, time since onset, decreased safety awareness  EVALUATION COMPLEXITY: Moderate    PLAN:  OT FREQUENCY: 1x/week  OT DURATION: 6 weeks (dates extended to allow for scheduling)  PLANNED INTERVENTIONS: 97168 OT Re-evaluation, 97535 self care/ADL training, 02889 therapeutic exercise, 97530 therapeutic activity, 97112 neuromuscular re-education, 97140 manual therapy, 97035 ultrasound, 97018 paraffin, 02960 fluidotherapy, 97760 Orthotics management and training, 02239 Splinting (initial encounter), [id number] Subsequent splinting/medication, passive range of motion, functional mobility training, energy conservation, patient/family education, and DME and/or AE instructions  RECOMMENDED OTHER SERVICES: PT eval already completed. OT discussed option for SLP eval d/t pt and pt's spouse reporting swallowing concerns. However, pt and pt's spouse seemed unsure at this time and pt's spouse stated they've [SLP has] done all they can and there's nothing different each time we've went. . Pt's spouse to f/u with MD for SLP referral if pt and pt's spouse decide to seek SLP referral.   CONSULTED AND AGREED  WITH PLAN OF CARE: Patient and family member/caregiver  PLAN FOR NEXT SESSION:   Continue to educate on safety awareness  Handouts - sleep positioning and sleep hygiene  Handout - tendon glides to improve coordination  Initiate FM coordination and theraputty (yellow or pink) HEP  Discuss A/E and adaptive strategies for ADL dressing, shower chair/tub bench options - show examples from storage room   Geofm FORBES Coder, OT 02/21/2023, 5:11 PM

## 2023-02-24 DIAGNOSIS — G1221 Amyotrophic lateral sclerosis: Secondary | ICD-10-CM | POA: Diagnosis not present

## 2023-03-01 ENCOUNTER — Ambulatory Visit: Payer: Medicare PPO

## 2023-03-01 ENCOUNTER — Ambulatory Visit: Payer: Medicare PPO | Attending: Family Medicine | Admitting: Occupational Therapy

## 2023-03-01 VITALS — BP 113/77

## 2023-03-01 DIAGNOSIS — R278 Other lack of coordination: Secondary | ICD-10-CM | POA: Diagnosis not present

## 2023-03-01 DIAGNOSIS — R2681 Unsteadiness on feet: Secondary | ICD-10-CM | POA: Diagnosis not present

## 2023-03-01 DIAGNOSIS — M6281 Muscle weakness (generalized): Secondary | ICD-10-CM | POA: Insufficient documentation

## 2023-03-01 NOTE — Therapy (Signed)
 OUTPATIENT OCCUPATIONAL THERAPY NEURO EVALUATION  Patient Name: Guy Payne MRN: [id number] DOB:08/23/1950, 73 y.o., male Today's Date: 03/01/2023  PCP: Dayna Motto, DO REFERRING PROVIDER: Dayna Motto, DO    END OF SESSION:  OT End of Session - 03/01/23 0933     Visit Number 2    Number of Visits 7   including eval   Date for OT Re-Evaluation 04/21/23    Authorization Type [employer] MC 2024, VL: [place]  Auth Reqd    Progress Note Due on Visit 10    OT Start Time 0935    OT Stop Time 1015    OT Time Calculation (min) 40 min    Equipment Utilized During Treatment yellow putty    Activity Tolerance Patient tolerated treatment well    Behavior During Therapy Impulsive;WFL for tasks assessed/performed             Past Medical History:  Diagnosis Date   Fall 2021   MCI (mild cognitive impairment)    Memory loss    Prostate disorder    blockage   Restless leg    Past Surgical History:  Procedure Laterality Date   genital surgery     was not specific   NO PAST SURGERIES     prostate repair for blockage     Patient Active Problem List   Diagnosis Date Noted   Periodic limb movements of sleep 05/14/2015   Amnestic MCI, 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  Pt came after PT who reported pt had been very tired throughout session.  Pt accompanied by: self and significant other - Becky  PERTINENT  HISTORY: ALS, MCI, Per 02/16/23 PT eval, pt demo'd decreased safety awareness and insight, demo'd wet quality of pt's breathing and reported semi-frequent aspiration, and demo'd inappropriate behavior and impulsiveness  PRECAUTIONS: Fall and Other: aphasia , impulsivity  WEIGHT BEARING RESTRICTIONS: No  PAIN:  Are you having pain? No pain  FALLS: Has patient fallen in last 6 months? Yes. Number of falls 6-7  LIVING ENVIRONMENT:  Lives with: lives with their family Lives in: House/apartment Stairs: Yes: Internal: flight steps; on left going up and External: 2 steps; none Has following equipment at home: Grab bars  PLOF: Independent and Needs assistance with homemaking, pt requires soft consistency of foods  PATIENT GOALS: Per pt's spouse, to be able to button clothes of pants. Per pt handwriting message: getting harder to write.  OBJECTIVE:  Note: Objective measures were completed at Evaluation unless otherwise noted.  HAND DOMINANCE: Right  ADLs: Overall ADLs: assistance Eating: Pt's spouse reported increased difficulty opening jars/containers. Pt's spouse reported difficulty with controlling drool. Pt's spouse reported pt can bring eating utensils and cup to mouth, working on pt taking smaller bites. Pt reported through handwritten message easy to get food in but hard to swallow. Grooming: Per pt, no problem. Per pt's spouse, some issues with swallowing when toothbrushing though action of toothbrushing unaffected. UB Dressing: some assistance with donning jacket, difficulty with zippers/buttons, more  frequently wearing elastic waist pants to avoid buttoning LB Dressing: some assistance with donning pants and socks, difficulty with tying laces Toileting: ind, pt's spouse reported pt having issues with constipation Bathing: ind with grab bars Tub Shower transfers: ind, slower Equipment: Grab bars and Walk in shower, raised commode, shower bench in upstairs shower  Pt uses  hot tub 3-4x per day and pt's spouse reported avoiding getting feeding tube for now d/t pt's preference for hot tub.  IADLs: dependent  Handwriting: 100% legible, Pt reported handwriting is harder d/t difficulty grasping pen.  MOBILITY STATUS: Independent without A/E, decreased attention to safety  POSTURE COMMENTS:  Ind with sitting balance  ACTIVITY TOLERANCE: Activity tolerance: Pt gestured: Pt frequently taking naps, 2-3 per day.  FUNCTIONAL OUTCOME MEASURES: PSFS  = 3.3    UPPER EXTREMITY ROM:    Active ROM Right eval Left eval  Shoulder flexion [facility]  Shoulder abduction    Shoulder adduction    Shoulder extension    Shoulder internal rotation    Shoulder external rotation    Elbow flexion    Elbow extension    Wrist flexion    Wrist extension    Wrist ulnar deviation    Wrist radial deviation    Wrist pronation    Wrist supination    (Blank rows = not tested)   HAND FUNCTION: Grip strength: Right: 16.5, 17.1, 18.2 (17.2 lbs average) lbs; Left: 24.0, 20.9, 20.9 (21.9 lbs average) lbs  COORDINATION: 9 Hole Peg test: Right: 60 sec - not standardized d/t pt not following verbal instructions to move one peg at a time despite continuous v/c. Left: did not test d/t pt demo'ing difficulty following verbal instructions.  OT noted ataxic movements and frequent drops with R UE during 9-hole peg test. Pt sometimes used LUE to stabilize peg in RUE despite verbal instructions and continuous v/c to use one hand only during 9-hole peg testing.  OT instead assessed box and blocks testing for coordination:  Box and Blocks:  LUE - 54 blocks with continuous v/c to move one block at a time. Pt attended to v/c. RUE - 55 blocks with fading v/c.  Pt's spouse reported pt has difficulty with bear claw motion (MCP ext, PIP flexed, DIP flexed) though no difficulty with composite fist. OT educated pt and spouse on FM coordination deficits compared to intact ROM and pt's spouse  acknowledged understanding.  SENSATION: impaired  EDEMA: none noted  MUSCLE TONE: RUE: Within functional limits and LUE: Within functional limits  COGNITION: Overall cognitive status: Impaired  Overall cognitive status: History of cognitive impairments - at baseline   Pt demo'd impulsive and inappropriate behavior today. When pt Vilinda) exited from restroom at beginning of OT session, Zakhari intercepted another individual who was walking in hallway with walker. Despite continuous v/c from OT and Kentley's wife, Deryck stepped in the individual's path, tapped the individual on shoulder several times, and attempted to grab the individual's hand to give the individual a high-five and handshake without attending to the safety and fall risk of either Jenesis and the individual with the walker. With repeated v/c, Octavis continued towards clinic treatment area. Jaishon attempted to hold OT 's hand and tap OT on shoulder. With stern and repeated v/c to Please keep hands to yourself, Jahmir attended to v/c.  For remainder of evaluation, Damyan generally abided by repeated v/c. Norleen sometimes blew kisses towards his spouse and towards OT though likely in jest. Rondell sometimes attempted to tap OT '  s shoulder to get OT 's attention secondary to nonverbal communication methods. Following v/c to Please keep hands to self, Norleen instead began tapping tabletop to get OT 's attention. While seated in a chair with armrests, Recardo frequently threw himself to R side in order to hug pt's spouse on pt's R side, indicating decreased insight into safety and decreased understanding of fall risk.  VISION: not tested  PERCEPTION: Not tested  PRAXIS: Not tested  OBSERVATIONS: Pt accompanied by spouse. Pt ambulated ind without A/E. Pt demo'd some instances of impulsivity, inappropriate behavior, and decreased insight into safety and deficits. See Cognition section above in Objective Measures for additional information.  Pt  nonverbal. To communicate, pt often used gestures, deferred to spouse to further explain, and later handwrote some responses on a piece of paper. Pt demo'd good legibility for handwriting in order to effectively communicate pt's thoughts to OT.                                                                                                                            TREATMENT DATE:   Pt issued tendon gliding exercises/handout with review of motions to isolate DIP, PIP and MCP joints for straight finger position, hook (DIP/PIP flexion), fist (DIP/PIP/MCP flexion), taco/duck (MCP flexion only) and flat fist (MCP and PIP flexion).  Patient benefited from extra time, verbal/tactile cues, and modeling of task to allow time for processing of verbal instructions and improve motor planning of unfamiliar movements.   Initiated Putty Exercises with yellow putty to begin strengthening, coordination and sensory stimulation of B UEs.  Patient provided visual demonstration, verbal and tactile cues as needed to improve performance of the various exercises/activities including:   - Putty Squeezes - cues to squeeze putty into log for use with other exercises and to fold putty in half with 1 hand  - Putty Rolls - encourage to roll putty into logs with sensory stimulation to entire length of hand, fingers and wrist as needed   - Pinch and Pull with Putty - this motion is combined with different pinches (3-Point Pinch, Key Pinch) - patient encouraged to combine tripod, and/or key pinch with pinch and pull motion of putty pulling away from midline, changing between different pinches and changing different directions to change grip   -Finger Extension with Putty - pt shown how to work on task of opening fingers and thumb inside putty loop  OT educated patient and spouse on theraputty recommendations: avoid hot environments, place in designated container, avoid contact with fabrics. Patient verbalized understanding.     Patient benefited from extra time, verbal/tactile cues, and modeling of task to allow time for processing of verbal instructions and improve motor planning of unfamiliar movements.   PATIENT EDUCATION: Education details: OT goals, Tendon glide and Putty Exercises Person educated: Patient and Spouse Education method: Explanation, Tactile cues, and Verbal cues Education comprehension: verbalized understanding and needs further education  HOME EXERCISE PROGRAM: 03/01/23: Tendon Glide and  Putty Exercises: Access Code: [email or web address]    GOALS: Goals reviewed with patient? Yes  SHORT TERM GOALS: Target date: 03/24/23  Patient will demonstrate initial BUE HEP with 25% verbal cues or less for proper execution.  Baseline: new to outpt OT Goal status: IN Progress  2.  Pt and pt's spouse will acknowledge understanding of sleep positioning and sleep hygiene strategies to improve sleep quality. Baseline: Pt reported difficulty with sleep Goal status: INITIAL  3.  Patient will demo maintained FM coordination for handwriting considerations as evidenced by completing nine-hole peg with use of RUE only in 65 seconds or less while attending to v/c to move one peg at a time using only RUE. Baseline: 9 Hole Peg test: Right: 60 sec - not standardized d/t pt not following verbal instructions to move one peg at a time despite continuous v/c.  Goal status: IN Progress  4.  Patient will maintain grip strength of BUE as evidenced by at least 17 lbs RUE grip strength and 21 lbs LUE grip strength as needed to open jars and other containers.  Baseline: Grip strength: Right: 16.5, 17.1, 18.2 (17.2 lbs average) lbs; Left: 24.0, 20.9, 20.9 (21.9 lbs average) lbs Goal status: IN Progress  5.  Pt and pt's spouse will verbalize understanding of shower chair and other bathing A/E options to decrease fall risk and improve safety for bathing ADL tasks. Baseline: Pt currently does not use shower chair. Pt's spouse expressed  interest in obtaining shower chair. OT educated pt and pt's spouse to call insurance company to discuss options which are covered. OT educated pt and pt's spouse on option to review examples of shower chairs and transfer tub benches at a future OT session. Pt's spouse verbalized understanding. Goal status: INITIAL   LONG TERM GOALS: Target date: 04/21/23  Patient will demonstrate updated BUE HEP with 25% verbal cues or less for proper execution.  Baseline: new to outpt OT Goal status: INITIAL  2.  Pt will demo slightly improved FM coordination as evidenced by placing at least 57 blocks using B hand with completion of Box and Blocks test.  Baseline: Box and Blocks:  LUE - 54 blocks with continuous v/c to move one block at a time. Pt attended to v/c. RUE - 55 blocks with fading v/c. Goal status: INITIAL  3.  Pt and pt's spouse will verbalize understanding of A/E and adaptive strategies to improve ind for dressing tasks, including zippers, buttons, and shoelaces. Baseline: Pt has increased difficulty with dressing tasks and manipulating buttons/zippers/shoelaces. Goal status: INITIAL  4.  Patient will report at least two-point increase in average PSFS score or at least three-point increase in a single activity score indicating functionally significant improvement given minimum detectable change. Baseline: PSFS = 3.3 total score (See above for individual activity scores)  Goal status: INITIAL   ASSESSMENT:  CLINICAL IMPRESSION: Patient is a 73 y.o. male who was seen today for occupational therapy treatment for ALS, Alzheimer's disease, and resulting limitations. Pt did demonstrate ongoing impulsiveness but did respond to simple hand cues/gestures to redirect him.  Patient responded well to instruction re: tendon gliding exercises to keep finger ROM as well as simple putty exercises for strength. Pt will  benefit from skilled OT services in the outpatient setting to work on impairments as noted  at eval to help maintain BUE strength and FM coordination, to improve ind for ADLs using adaptive strategies and A/E PRN, and to improve functional use of BUE.  PERFORMANCE DEFICITS: in functional  skills including ADLs, IADLs, coordination, dexterity, proprioception, sensation, ROM, strength, flexibility, Fine motor control, Gross motor control, mobility, balance, body mechanics, endurance, decreased knowledge of precautions, decreased knowledge of use of DME, and UE functional use, cognitive skills including attention, energy/drive, learn, memory, safety awareness, sequencing, temperament/personality, and understand, and psychosocial skills including environmental adaptation, habits, interpersonal interactions, and routines and behaviors.   IMPAIRMENTS: are limiting patient from ADLs, IADLs, rest and sleep, leisure, and social participation.   CO-MORBIDITIES: may have co-morbidities  that affects occupational performance. Patient will benefit from skilled OT to address above impairments and improve overall function.  REHAB POTENTIAL: Fair d/t progressive nature of condition, time since onset, decreased safety awareness  PLAN:  OT FREQUENCY: 1x/week  OT DURATION: 6 weeks (dates extended to allow for scheduling)  PLANNED INTERVENTIONS: 97168 OT Re-evaluation, 97535 self care/ADL training, 02889 therapeutic exercise, 97530 therapeutic activity, 97112 neuromuscular re-education, 97140 manual therapy, 97035 ultrasound, 97018 paraffin, 02960 fluidotherapy, 97760 Orthotics management and training, 02239 Splinting (initial encounter), [id number] Subsequent splinting/medication, passive range of motion, functional mobility training, energy conservation, patient/family education, and DME and/or AE instructions  RECOMMENDED OTHER SERVICES: PT eval already completed. OT discussed option for SLP eval d/t pt and pt's spouse reporting swallowing concerns. However, pt and pt's spouse seemed unsure at this time and  pt's spouse stated they've [SLP has] done all they can and there's nothing different each time we've went. . Pt's spouse to f/u with MD for SLP referral if pt and pt's spouse decide to seek SLP referral.   CONSULTED AND AGREED WITH PLAN OF CARE: Patient and family member/caregiver  PLAN FOR NEXT SESSION:   Continue to educate on safety awareness  Handouts - sleep positioning and sleep hygiene  Review - tendon glides to improve digital ROM and theraputty (yellow) HEP  Initiate FM coordination   Discuss A/E and adaptive strategies for ADL dressing, shower chair/tub bench options - show examples from storage room   Clarita LITTIE Pride, OT 03/01/2023, 4:48 PM

## 2023-03-01 NOTE — Patient Instructions (Signed)
   Access Code: [email or web address] URL: [email or web address] Date: 03/01/2023 Prepared by: Clarita Pride  Exercises - Putty Squeezes  - 2 x daily - 10 reps - Rolling Putty on Table  - 2 x daily - 10 reps - Finger Pinch and Pull with Putty  - 2 x daily - 10 reps - 3-Point Pinch with Putty  - 2 x daily - 10 reps - Key Pinch with Putty  - 2 x daily - 10 reps - Finger Extension with Putty  - 2 x daily - 10 reps

## 2023-03-01 NOTE — Therapy (Signed)
 OUTPATIENT PHYSICAL THERAPY TREATMENT NOTE   Patient Name: Guy Payne MRN: [id number] DOB:[birth year], 73 y.o., male Today's Date: 03/01/2023   PCP: Bernardino Boone, DO REFERRING PROVIDER: Bernardino Boone, DO  END OF SESSION:  PT End of Session - 03/01/23 0848     Visit Number 2    Number of Visits 7    Date for PT Re-Evaluation 04/14/23    Authorization Type [employer]    Progress Note Due on Visit 10    PT Start Time 0850    PT Stop Time 0930    PT Time Calculation (min) 40 min    Equipment Utilized During Treatment Gait belt    Activity Tolerance Patient tolerated treatment well    Behavior During Therapy Impulsive             Past Medical History:  Diagnosis Date   Fall 2021   MCI (mild cognitive impairment)    Memory loss    Prostate disorder    blockage   Restless leg    Past Surgical History:  Procedure Laterality Date   genital surgery     was not specific   NO PAST SURGERIES     prostate repair for blockage     Patient Active Problem List   Diagnosis Date Noted   Periodic limb movements of sleep 05/14/2015   Amnestic MCI, 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) 11/16/2014   Glucose intolerance (impaired glucose tolerance) 11/16/2014    ONSET DATE: 01/27/23 referral  REFERRING DIAG:  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 Wife reports that they are putting feeding tube on next Wednesday. His nutrition is off because he hasn't been eating well due to  dysphagia. Wife reports one fall in last month. They came home from trip and he grabbed all the hanger clothes and was trying to carry them and weight of the clothes knocked his balance off. They recently moved to downstairs room since weekend so he can be on main level and doesn't have to go up and down stairs. Wife reports marked decline in his balance since Saturday which she attributes to swallowing issues.  Pt accompanied  by: significant other  PERTINENT HISTORY: ALS, MCI  PAIN:  Are you having pain? No  PRECAUTIONS: Fall and Other: aphasia  WEIGHT BEARING RESTRICTIONS: No  FALLS: Has patient fallen in last 6 months? Yes. Number of falls 6-7  LIVING ENVIRONMENT: Lives with: lives with their family Lives in: House/apartment Stairs: Yes: Internal: flight steps; on right going up and External: 2 steps; none Has following equipment at home: Grab bars  PLOF: Independent and Needs assistance with homemaking  PATIENT GOALS: to not fall   OBJECTIVE:  Note: Objective measures were completed at Evaluation unless otherwise noted.   COGNITION: Overall cognitive status: History of cognitive impairments - at baseline   SENSATION: WFL  POSTURE: No Significant postural limitations   BED MOBILITY:  independent  TRANSFERS: Assistive device utilized: None  Sit to stand: Modified independence Stand to sit: Modified independence Chair to chair: Modified independence   STAIRS: Level of Assistance: SBA Stair Negotiation Technique: Alternating Pattern  with Bilateral Rails Number of Stairs: 4  Height of Stairs: 6   GAIT: Gait pattern: step through pattern, decreased arm swing- Right, decreased arm swing- Left, wide BOS, poor foot clearance- Right, and poor foot clearance- Left Distance walked: clinic Assistive device utilized: None Level of assistance: Modified independence and SBA Comments: often changing speed, intentionally walking toward obstacles, generally  unsafe  FUNCTIONAL TESTS:                                                                                    TREATMENT  PT helped pt from waiting area to room #3 by holding his hand. Noted crossing legs while he was walking. Pt brought to room and he laid down in the bed 2-3x while conversing with wife. Pt was asked to write his name and address on IPAD. Which patient did correctly. Pt did try to hug therapists 3x during the session, 1x trying to wipe therapist's face with his mouth towel. Pt was redirected appropriately. Wife reports some memory problems. Wife reports until Saturday, he goes to shower himself, he needs some help with don/doff clothes. He also helps her with light cleaning around the house.   Pt was then brought to gym while wife was in room. SciFit: level 4 for 10' Vitals:   03/01/23 0911 03/01/23 0914  BP: (!) 129/94 113/77   Fwd and bwd walking: 4feet x 5 with CGA Lateral stepping: 3 x 10 feet R and L with CGA, during the last round, pt had excessive lean forward (PT thought he was falling asleep so when asked, pt agreed that he was feeling drowsy). PT asked patient to sit and rest but pt preferred to lay down on his belly. Pt was asked to lay on his back to avoid straining neck muscles but patient stayed in prone position.  Sit to stand: 10x with bil UE from mat table Pt needed to lay down again. Standing box taps: 6 box: 20x R and L Pt rested in sidelying before OT session. Handoff comm provided to OT and pt was walked over to OT desk.     PATIENT EDUCATION: Education details: PT POC, exam findings, limiting multi tasking, assisted cough, use  of AD for safety Person educated: Patient and Spouse Education method: Explanation and Handouts Education comprehension: verbalized understanding and needs further education  HOME EXERCISE PROGRAM: To be provided  GOALS: Goals reviewed with patient? Yes  SHORT TERM GOALS: Target date: 03/17/23  Pt will be  independent with initial HEP for improved safety awareness,  functional strength, balance Baseline: to be provided Goal status: INITIAL  2.  Pt will improve 5x STS to </= 16 sec to demo improved functional LE strength and balance   Baseline: 20.81s B UE Goal status: INITIAL    LONG TERM GOALS: Target date: 04/14/23  Pt will be independent with final HEP for improved safety awareness, functional strength, balance  Baseline:  to be provided Goal status: INITIAL  2.  Pt will improve 5x STS to </= 13 sec to demo improved functional LE strength and balance   Baseline: 20.81s B UE Goal status: INITIAL  3.  Pt will improve FGA to >/= 25/30 to demonstrate improved balance and reduced fall risk  Baseline: 23/30 Goal status: INITIAL  4.  Pt will improve gait speed to >/= 1.75m/s  to demonstrate improved community ambulation  Baseline: 1.71m/s no AD Goal status: INITIAL   ASSESSMENT:  CLINICAL IMPRESSION: Pt has definitely impulsivity that needs to redirected during the session. Pt able to follow instructions well. Pt was lethargic and required resting breaks where he fell asleep. Wife was notified on his session progress. Most significant issues noted with gait were crossing legs over midline during walking, poor safety awareness and impulsivity that can lead to fall. Pt also attested to some dizziness with position changes and was given time to recover after position changes were made.   OBJECTIVE IMPAIRMENTS: Abnormal gait, decreased balance, decreased cognition, decreased knowledge of condition, decreased knowledge of use of DME, decreased safety awareness, and impaired perceived functional ability.   ACTIVITY LIMITATIONS: carrying, lifting, stairs, locomotion level, and caring for others  PARTICIPATION LIMITATIONS: meal prep, cleaning, interpersonal relationship, and community activity  PERSONAL FACTORS: Age, Behavior pattern, Time since onset of injury/illness/exacerbation, and  Transportation are also affecting patient's functional outcome.   REHAB POTENTIAL: Fair dx, time since onset  CLINICAL DECISION MAKING: Evolving/moderate complexity  EVALUATION COMPLEXITY: Moderate  PLAN:  PT FREQUENCY: 1x/week  PT DURATION: 6 weeks  PLANNED INTERVENTIONS: 97164- PT Re-evaluation, 97110-Therapeutic exercises, 97530- Therapeutic activity, 97112- Neuromuscular re-education, 97535- Self Care, 02859- Manual therapy, [phone]- Gait training, [phone]- Orthotic Fit/training, [phone]- Aquatic Therapy, Patient/Family education, Balance training, Stair training, Vestibular training, Visual/preceptual remediation/compensation, Cognitive remediation, and DME instructions  PLAN FOR NEXT SESSION: HEP, address safety awareness, FAMILY EDUCATION RE: NEED FOR SUPPORT WITH SAFETY, use of AD for safety   Raj LOISE Blanch, PT   03/01/2023, 8:48 AM

## 2023-03-02 DIAGNOSIS — R1312 Dysphagia, oropharyngeal phase: Secondary | ICD-10-CM | POA: Diagnosis not present

## 2023-03-02 DIAGNOSIS — Z66 Do not resuscitate: Secondary | ICD-10-CM | POA: Diagnosis not present

## 2023-03-02 DIAGNOSIS — R64 Cachexia: Secondary | ICD-10-CM | POA: Diagnosis not present

## 2023-03-02 DIAGNOSIS — E860 Dehydration: Secondary | ICD-10-CM | POA: Diagnosis not present

## 2023-03-02 DIAGNOSIS — R1310 Dysphagia, unspecified: Secondary | ICD-10-CM | POA: Diagnosis not present

## 2023-03-02 DIAGNOSIS — E43 Unspecified severe protein-calorie malnutrition: Secondary | ICD-10-CM | POA: Diagnosis not present

## 2023-03-02 DIAGNOSIS — F0280 Dementia in other diseases classified elsewhere without behavioral disturbance: Secondary | ICD-10-CM | POA: Diagnosis not present

## 2023-03-02 DIAGNOSIS — Z4659 Encounter for fitting and adjustment of other gastrointestinal appliance and device: Secondary | ICD-10-CM | POA: Diagnosis not present

## 2023-03-02 DIAGNOSIS — I10 Essential (primary) hypertension: Secondary | ICD-10-CM | POA: Diagnosis not present

## 2023-03-02 DIAGNOSIS — Z7409 Other reduced mobility: Secondary | ICD-10-CM | POA: Diagnosis not present

## 2023-03-02 DIAGNOSIS — R531 Weakness: Secondary | ICD-10-CM | POA: Diagnosis not present

## 2023-03-02 DIAGNOSIS — E870 Hyperosmolality and hypernatremia: Secondary | ICD-10-CM | POA: Diagnosis not present

## 2023-03-02 DIAGNOSIS — Z931 Gastrostomy status: Secondary | ICD-10-CM | POA: Diagnosis not present

## 2023-03-02 DIAGNOSIS — G1221 Amyotrophic lateral sclerosis: Secondary | ICD-10-CM | POA: Diagnosis not present

## 2023-03-02 DIAGNOSIS — R627 Adult failure to thrive: Secondary | ICD-10-CM | POA: Diagnosis not present

## 2023-03-02 DIAGNOSIS — R059 Cough, unspecified: Secondary | ICD-10-CM | POA: Diagnosis not present

## 2023-03-02 DIAGNOSIS — R633 Feeding difficulties, unspecified: Secondary | ICD-10-CM | POA: Diagnosis not present

## 2023-03-08 ENCOUNTER — Ambulatory Visit: Payer: Medicare PPO

## 2023-03-08 ENCOUNTER — Encounter: Payer: Medicare PPO | Admitting: Occupational Therapy

## 2023-03-08 DIAGNOSIS — R1312 Dysphagia, oropharyngeal phase: Secondary | ICD-10-CM | POA: Diagnosis not present

## 2023-03-08 DIAGNOSIS — G1221 Amyotrophic lateral sclerosis: Secondary | ICD-10-CM | POA: Diagnosis not present

## 2023-03-13 ENCOUNTER — Encounter: Payer: Self-pay | Admitting: Occupational Therapy

## 2023-03-13 NOTE — Therapy (Signed)
[facility] [address] [place], [place], [address] Phone: [phone]   Fax:  [phone]  Patient Details  Name: Guy Payne MRN: [id number] Date of Birth: May 10, 1950  OCCUPATIONAL THERAPY DISCHARGE SUMMARY  Visits from Start of Care: 1 + evaluation  Current functional level related to goals / functional outcomes: Pt has not met all goals but will be receiving home health services at this time.  Remaining deficits: Pt has various remaining functional deficits related to his progressive diagnosis.  Education / Equipment: Pt had some materials and education. See tx notes for more details.   Patient is discharged at this time due to not returning to outpatient occupational therapy following hospitalization with plans for home health therapies.    Victorino Sparrow, OT 03/13/2023, 7:56 AM  [facility] [address] [place], [place], [address] Phone: [phone]   Fax:  [phone]

## 2023-03-14 ENCOUNTER — Ambulatory Visit: Payer: Self-pay

## 2023-03-14 DIAGNOSIS — M6281 Muscle weakness (generalized): Secondary | ICD-10-CM

## 2023-03-14 DIAGNOSIS — R2681 Unsteadiness on feet: Secondary | ICD-10-CM

## 2023-03-14 NOTE — Therapy (Signed)
Patient Name: Guy Payne MRN: [id number] DOB:June 13, 1950, 73 y.o., male Today's Date: 03/14/2023  PHYSICAL THERAPY DISCHARGE SUMMARY  Visits from Start of Care: 2   Current functional level related to goals / functional outcomes: Pt has not met any goals as he was admitted to hospital. Due to medical status change and patient getting home health PT, patient will be discharged from current episode of outpatient PT.   Remaining deficits: We were not able to address any deficits as patient was only seen for evaluation.   Education / Equipment: Not currently discussed   Patient agrees to discharge. Patient goals were not met. Patient is being discharged due to  medical status change and receiving home health PT after hospital admission.    Ileana Ladd, PT 03/14/2023, 6:06 AM

## 2023-03-17 DIAGNOSIS — R1312 Dysphagia, oropharyngeal phase: Secondary | ICD-10-CM | POA: Diagnosis not present

## 2023-03-17 DIAGNOSIS — G1221 Amyotrophic lateral sclerosis: Secondary | ICD-10-CM | POA: Diagnosis not present

## 2023-03-18 DIAGNOSIS — I10 Essential (primary) hypertension: Secondary | ICD-10-CM | POA: Diagnosis not present

## 2023-03-18 DIAGNOSIS — R4701 Aphasia: Secondary | ICD-10-CM | POA: Diagnosis not present

## 2023-03-18 DIAGNOSIS — G1221 Amyotrophic lateral sclerosis: Secondary | ICD-10-CM | POA: Diagnosis not present

## 2023-03-18 DIAGNOSIS — E46 Unspecified protein-calorie malnutrition: Secondary | ICD-10-CM | POA: Diagnosis not present

## 2023-03-18 DIAGNOSIS — F0280 Dementia in other diseases classified elsewhere without behavioral disturbance: Secondary | ICD-10-CM | POA: Diagnosis not present

## 2023-03-18 DIAGNOSIS — G3109 Other frontotemporal dementia: Secondary | ICD-10-CM | POA: Diagnosis not present

## 2023-03-18 DIAGNOSIS — R627 Adult failure to thrive: Secondary | ICD-10-CM | POA: Diagnosis not present

## 2023-03-18 DIAGNOSIS — Z431 Encounter for attention to gastrostomy: Secondary | ICD-10-CM | POA: Diagnosis not present

## 2023-03-18 DIAGNOSIS — R1310 Dysphagia, unspecified: Secondary | ICD-10-CM | POA: Diagnosis not present

## 2023-03-21 DIAGNOSIS — Z6820 Body mass index (BMI) 20.0-20.9, adult: Secondary | ICD-10-CM | POA: Diagnosis not present

## 2023-03-21 DIAGNOSIS — E870 Hyperosmolality and hypernatremia: Secondary | ICD-10-CM | POA: Diagnosis not present

## 2023-03-21 DIAGNOSIS — G1221 Amyotrophic lateral sclerosis: Secondary | ICD-10-CM | POA: Diagnosis not present

## 2023-03-21 DIAGNOSIS — F0280 Dementia in other diseases classified elsewhere without behavioral disturbance: Secondary | ICD-10-CM | POA: Diagnosis not present

## 2023-03-21 DIAGNOSIS — G3109 Other frontotemporal dementia: Secondary | ICD-10-CM | POA: Diagnosis not present

## 2023-03-21 DIAGNOSIS — Z431 Encounter for attention to gastrostomy: Secondary | ICD-10-CM | POA: Diagnosis not present

## 2023-03-21 DIAGNOSIS — G309 Alzheimer's disease, unspecified: Secondary | ICD-10-CM | POA: Diagnosis not present

## 2023-03-21 DIAGNOSIS — E46 Unspecified protein-calorie malnutrition: Secondary | ICD-10-CM | POA: Diagnosis not present

## 2023-03-21 DIAGNOSIS — I10 Essential (primary) hypertension: Secondary | ICD-10-CM | POA: Diagnosis not present

## 2023-03-21 DIAGNOSIS — R4701 Aphasia: Secondary | ICD-10-CM | POA: Diagnosis not present

## 2023-03-21 DIAGNOSIS — R1310 Dysphagia, unspecified: Secondary | ICD-10-CM | POA: Diagnosis not present

## 2023-03-21 DIAGNOSIS — Z09 Encounter for follow-up examination after completed treatment for conditions other than malignant neoplasm: Secondary | ICD-10-CM | POA: Diagnosis not present

## 2023-03-21 DIAGNOSIS — R627 Adult failure to thrive: Secondary | ICD-10-CM | POA: Diagnosis not present

## 2023-03-21 DIAGNOSIS — Z931 Gastrostomy status: Secondary | ICD-10-CM | POA: Diagnosis not present

## 2023-03-22 ENCOUNTER — Encounter: Payer: Medicare PPO | Admitting: Occupational Therapy

## 2023-03-22 ENCOUNTER — Ambulatory Visit: Payer: Medicare PPO

## 2023-03-23 DIAGNOSIS — R009 Unspecified abnormalities of heart beat: Secondary | ICD-10-CM | POA: Diagnosis not present

## 2023-03-24 DIAGNOSIS — F0280 Dementia in other diseases classified elsewhere without behavioral disturbance: Secondary | ICD-10-CM | POA: Diagnosis not present

## 2023-03-24 DIAGNOSIS — R627 Adult failure to thrive: Secondary | ICD-10-CM | POA: Diagnosis not present

## 2023-03-24 DIAGNOSIS — R1310 Dysphagia, unspecified: Secondary | ICD-10-CM | POA: Diagnosis not present

## 2023-03-24 DIAGNOSIS — I10 Essential (primary) hypertension: Secondary | ICD-10-CM | POA: Diagnosis not present

## 2023-03-24 DIAGNOSIS — R4701 Aphasia: Secondary | ICD-10-CM | POA: Diagnosis not present

## 2023-03-24 DIAGNOSIS — Z431 Encounter for attention to gastrostomy: Secondary | ICD-10-CM | POA: Diagnosis not present

## 2023-03-24 DIAGNOSIS — E46 Unspecified protein-calorie malnutrition: Secondary | ICD-10-CM | POA: Diagnosis not present

## 2023-03-24 DIAGNOSIS — G1221 Amyotrophic lateral sclerosis: Secondary | ICD-10-CM | POA: Diagnosis not present

## 2023-03-24 DIAGNOSIS — G3109 Other frontotemporal dementia: Secondary | ICD-10-CM | POA: Diagnosis not present

## 2023-03-25 DIAGNOSIS — E46 Unspecified protein-calorie malnutrition: Secondary | ICD-10-CM | POA: Diagnosis not present

## 2023-03-25 DIAGNOSIS — Z431 Encounter for attention to gastrostomy: Secondary | ICD-10-CM | POA: Diagnosis not present

## 2023-03-25 DIAGNOSIS — R627 Adult failure to thrive: Secondary | ICD-10-CM | POA: Diagnosis not present

## 2023-03-25 DIAGNOSIS — R1310 Dysphagia, unspecified: Secondary | ICD-10-CM | POA: Diagnosis not present

## 2023-03-25 DIAGNOSIS — F0280 Dementia in other diseases classified elsewhere without behavioral disturbance: Secondary | ICD-10-CM | POA: Diagnosis not present

## 2023-03-25 DIAGNOSIS — I10 Essential (primary) hypertension: Secondary | ICD-10-CM | POA: Diagnosis not present

## 2023-03-25 DIAGNOSIS — R4701 Aphasia: Secondary | ICD-10-CM | POA: Diagnosis not present

## 2023-03-25 DIAGNOSIS — G1221 Amyotrophic lateral sclerosis: Secondary | ICD-10-CM | POA: Diagnosis not present

## 2023-03-25 DIAGNOSIS — G3109 Other frontotemporal dementia: Secondary | ICD-10-CM | POA: Diagnosis not present

## 2023-03-25 DIAGNOSIS — R1312 Dysphagia, oropharyngeal phase: Secondary | ICD-10-CM | POA: Diagnosis not present

## 2023-03-27 DIAGNOSIS — R1310 Dysphagia, unspecified: Secondary | ICD-10-CM | POA: Diagnosis not present

## 2023-03-27 DIAGNOSIS — Z431 Encounter for attention to gastrostomy: Secondary | ICD-10-CM | POA: Diagnosis not present

## 2023-03-27 DIAGNOSIS — G3109 Other frontotemporal dementia: Secondary | ICD-10-CM | POA: Diagnosis not present

## 2023-03-27 DIAGNOSIS — I10 Essential (primary) hypertension: Secondary | ICD-10-CM | POA: Diagnosis not present

## 2023-03-27 DIAGNOSIS — E46 Unspecified protein-calorie malnutrition: Secondary | ICD-10-CM | POA: Diagnosis not present

## 2023-03-27 DIAGNOSIS — F0280 Dementia in other diseases classified elsewhere without behavioral disturbance: Secondary | ICD-10-CM | POA: Diagnosis not present

## 2023-03-27 DIAGNOSIS — G1221 Amyotrophic lateral sclerosis: Secondary | ICD-10-CM | POA: Diagnosis not present

## 2023-03-27 DIAGNOSIS — R4701 Aphasia: Secondary | ICD-10-CM | POA: Diagnosis not present

## 2023-03-27 DIAGNOSIS — R627 Adult failure to thrive: Secondary | ICD-10-CM | POA: Diagnosis not present

## 2023-03-29 ENCOUNTER — Ambulatory Visit: Payer: Medicare PPO

## 2023-03-29 ENCOUNTER — Encounter: Payer: Medicare PPO | Admitting: Occupational Therapy

## 2023-03-29 DIAGNOSIS — G1221 Amyotrophic lateral sclerosis: Secondary | ICD-10-CM | POA: Diagnosis not present

## 2023-03-29 DIAGNOSIS — R1312 Dysphagia, oropharyngeal phase: Secondary | ICD-10-CM | POA: Diagnosis not present

## 2023-03-30 DIAGNOSIS — G3109 Other frontotemporal dementia: Secondary | ICD-10-CM | POA: Diagnosis not present

## 2023-03-30 DIAGNOSIS — R1310 Dysphagia, unspecified: Secondary | ICD-10-CM | POA: Diagnosis not present

## 2023-03-30 DIAGNOSIS — I10 Essential (primary) hypertension: Secondary | ICD-10-CM | POA: Diagnosis not present

## 2023-03-30 DIAGNOSIS — F0280 Dementia in other diseases classified elsewhere without behavioral disturbance: Secondary | ICD-10-CM | POA: Diagnosis not present

## 2023-03-30 DIAGNOSIS — R627 Adult failure to thrive: Secondary | ICD-10-CM | POA: Diagnosis not present

## 2023-03-30 DIAGNOSIS — E46 Unspecified protein-calorie malnutrition: Secondary | ICD-10-CM | POA: Diagnosis not present

## 2023-03-30 DIAGNOSIS — Z431 Encounter for attention to gastrostomy: Secondary | ICD-10-CM | POA: Diagnosis not present

## 2023-03-30 DIAGNOSIS — G1221 Amyotrophic lateral sclerosis: Secondary | ICD-10-CM | POA: Diagnosis not present

## 2023-03-30 DIAGNOSIS — R4701 Aphasia: Secondary | ICD-10-CM | POA: Diagnosis not present

## 2023-03-31 DIAGNOSIS — Z431 Encounter for attention to gastrostomy: Secondary | ICD-10-CM | POA: Diagnosis not present

## 2023-03-31 DIAGNOSIS — I10 Essential (primary) hypertension: Secondary | ICD-10-CM | POA: Diagnosis not present

## 2023-03-31 DIAGNOSIS — R4701 Aphasia: Secondary | ICD-10-CM | POA: Diagnosis not present

## 2023-03-31 DIAGNOSIS — R1310 Dysphagia, unspecified: Secondary | ICD-10-CM | POA: Diagnosis not present

## 2023-03-31 DIAGNOSIS — F0280 Dementia in other diseases classified elsewhere without behavioral disturbance: Secondary | ICD-10-CM | POA: Diagnosis not present

## 2023-03-31 DIAGNOSIS — E46 Unspecified protein-calorie malnutrition: Secondary | ICD-10-CM | POA: Diagnosis not present

## 2023-03-31 DIAGNOSIS — G3109 Other frontotemporal dementia: Secondary | ICD-10-CM | POA: Diagnosis not present

## 2023-03-31 DIAGNOSIS — R627 Adult failure to thrive: Secondary | ICD-10-CM | POA: Diagnosis not present

## 2023-03-31 DIAGNOSIS — G1221 Amyotrophic lateral sclerosis: Secondary | ICD-10-CM | POA: Diagnosis not present

## 2023-04-03 DIAGNOSIS — R4701 Aphasia: Secondary | ICD-10-CM | POA: Diagnosis not present

## 2023-04-03 DIAGNOSIS — I10 Essential (primary) hypertension: Secondary | ICD-10-CM | POA: Diagnosis not present

## 2023-04-03 DIAGNOSIS — E46 Unspecified protein-calorie malnutrition: Secondary | ICD-10-CM | POA: Diagnosis not present

## 2023-04-03 DIAGNOSIS — R627 Adult failure to thrive: Secondary | ICD-10-CM | POA: Diagnosis not present

## 2023-04-03 DIAGNOSIS — R1310 Dysphagia, unspecified: Secondary | ICD-10-CM | POA: Diagnosis not present

## 2023-04-03 DIAGNOSIS — G1221 Amyotrophic lateral sclerosis: Secondary | ICD-10-CM | POA: Diagnosis not present

## 2023-04-03 DIAGNOSIS — G3109 Other frontotemporal dementia: Secondary | ICD-10-CM | POA: Diagnosis not present

## 2023-04-03 DIAGNOSIS — Z431 Encounter for attention to gastrostomy: Secondary | ICD-10-CM | POA: Diagnosis not present

## 2023-04-03 DIAGNOSIS — F0280 Dementia in other diseases classified elsewhere without behavioral disturbance: Secondary | ICD-10-CM | POA: Diagnosis not present

## 2023-04-04 DIAGNOSIS — F0280 Dementia in other diseases classified elsewhere without behavioral disturbance: Secondary | ICD-10-CM | POA: Diagnosis not present

## 2023-04-04 DIAGNOSIS — R4701 Aphasia: Secondary | ICD-10-CM | POA: Diagnosis not present

## 2023-04-04 DIAGNOSIS — I10 Essential (primary) hypertension: Secondary | ICD-10-CM | POA: Diagnosis not present

## 2023-04-04 DIAGNOSIS — Z431 Encounter for attention to gastrostomy: Secondary | ICD-10-CM | POA: Diagnosis not present

## 2023-04-04 DIAGNOSIS — G1221 Amyotrophic lateral sclerosis: Secondary | ICD-10-CM | POA: Diagnosis not present

## 2023-04-04 DIAGNOSIS — R627 Adult failure to thrive: Secondary | ICD-10-CM | POA: Diagnosis not present

## 2023-04-04 DIAGNOSIS — R1310 Dysphagia, unspecified: Secondary | ICD-10-CM | POA: Diagnosis not present

## 2023-04-04 DIAGNOSIS — G3109 Other frontotemporal dementia: Secondary | ICD-10-CM | POA: Diagnosis not present

## 2023-04-04 DIAGNOSIS — E46 Unspecified protein-calorie malnutrition: Secondary | ICD-10-CM | POA: Diagnosis not present

## 2023-04-05 ENCOUNTER — Encounter: Payer: Medicare PPO | Admitting: Occupational Therapy

## 2023-04-05 ENCOUNTER — Ambulatory Visit: Payer: Medicare PPO

## 2023-04-06 DIAGNOSIS — R4701 Aphasia: Secondary | ICD-10-CM | POA: Diagnosis not present

## 2023-04-06 DIAGNOSIS — G3109 Other frontotemporal dementia: Secondary | ICD-10-CM | POA: Diagnosis not present

## 2023-04-06 DIAGNOSIS — R1310 Dysphagia, unspecified: Secondary | ICD-10-CM | POA: Diagnosis not present

## 2023-04-06 DIAGNOSIS — R627 Adult failure to thrive: Secondary | ICD-10-CM | POA: Diagnosis not present

## 2023-04-06 DIAGNOSIS — I10 Essential (primary) hypertension: Secondary | ICD-10-CM | POA: Diagnosis not present

## 2023-04-06 DIAGNOSIS — F0280 Dementia in other diseases classified elsewhere without behavioral disturbance: Secondary | ICD-10-CM | POA: Diagnosis not present

## 2023-04-06 DIAGNOSIS — G1221 Amyotrophic lateral sclerosis: Secondary | ICD-10-CM | POA: Diagnosis not present

## 2023-04-06 DIAGNOSIS — E46 Unspecified protein-calorie malnutrition: Secondary | ICD-10-CM | POA: Diagnosis not present

## 2023-04-06 DIAGNOSIS — Z431 Encounter for attention to gastrostomy: Secondary | ICD-10-CM | POA: Diagnosis not present

## 2023-04-10 DIAGNOSIS — F0280 Dementia in other diseases classified elsewhere without behavioral disturbance: Secondary | ICD-10-CM | POA: Diagnosis not present

## 2023-04-10 DIAGNOSIS — G3109 Other frontotemporal dementia: Secondary | ICD-10-CM | POA: Diagnosis not present

## 2023-04-10 DIAGNOSIS — G1221 Amyotrophic lateral sclerosis: Secondary | ICD-10-CM | POA: Diagnosis not present

## 2023-04-10 DIAGNOSIS — R1310 Dysphagia, unspecified: Secondary | ICD-10-CM | POA: Diagnosis not present

## 2023-04-10 DIAGNOSIS — I10 Essential (primary) hypertension: Secondary | ICD-10-CM | POA: Diagnosis not present

## 2023-04-10 DIAGNOSIS — R627 Adult failure to thrive: Secondary | ICD-10-CM | POA: Diagnosis not present

## 2023-04-10 DIAGNOSIS — E46 Unspecified protein-calorie malnutrition: Secondary | ICD-10-CM | POA: Diagnosis not present

## 2023-04-10 DIAGNOSIS — Z431 Encounter for attention to gastrostomy: Secondary | ICD-10-CM | POA: Diagnosis not present

## 2023-04-10 DIAGNOSIS — R4701 Aphasia: Secondary | ICD-10-CM | POA: Diagnosis not present

## 2023-04-11 DIAGNOSIS — R627 Adult failure to thrive: Secondary | ICD-10-CM | POA: Diagnosis not present

## 2023-04-11 DIAGNOSIS — Z431 Encounter for attention to gastrostomy: Secondary | ICD-10-CM | POA: Diagnosis not present

## 2023-04-11 DIAGNOSIS — G1221 Amyotrophic lateral sclerosis: Secondary | ICD-10-CM | POA: Diagnosis not present

## 2023-04-11 DIAGNOSIS — F0280 Dementia in other diseases classified elsewhere without behavioral disturbance: Secondary | ICD-10-CM | POA: Diagnosis not present

## 2023-04-11 DIAGNOSIS — I10 Essential (primary) hypertension: Secondary | ICD-10-CM | POA: Diagnosis not present

## 2023-04-11 DIAGNOSIS — R4701 Aphasia: Secondary | ICD-10-CM | POA: Diagnosis not present

## 2023-04-11 DIAGNOSIS — E46 Unspecified protein-calorie malnutrition: Secondary | ICD-10-CM | POA: Diagnosis not present

## 2023-04-11 DIAGNOSIS — R1310 Dysphagia, unspecified: Secondary | ICD-10-CM | POA: Diagnosis not present

## 2023-04-11 DIAGNOSIS — G3109 Other frontotemporal dementia: Secondary | ICD-10-CM | POA: Diagnosis not present

## 2023-04-12 ENCOUNTER — Encounter: Payer: Medicare PPO | Admitting: Occupational Therapy

## 2023-04-12 ENCOUNTER — Ambulatory Visit: Payer: Medicare PPO

## 2023-04-13 DIAGNOSIS — Z431 Encounter for attention to gastrostomy: Secondary | ICD-10-CM | POA: Diagnosis not present

## 2023-04-13 DIAGNOSIS — R1310 Dysphagia, unspecified: Secondary | ICD-10-CM | POA: Diagnosis not present

## 2023-04-13 DIAGNOSIS — E46 Unspecified protein-calorie malnutrition: Secondary | ICD-10-CM | POA: Diagnosis not present

## 2023-04-13 DIAGNOSIS — R627 Adult failure to thrive: Secondary | ICD-10-CM | POA: Diagnosis not present

## 2023-04-13 DIAGNOSIS — G3109 Other frontotemporal dementia: Secondary | ICD-10-CM | POA: Diagnosis not present

## 2023-04-13 DIAGNOSIS — R4701 Aphasia: Secondary | ICD-10-CM | POA: Diagnosis not present

## 2023-04-13 DIAGNOSIS — F0280 Dementia in other diseases classified elsewhere without behavioral disturbance: Secondary | ICD-10-CM | POA: Diagnosis not present

## 2023-04-13 DIAGNOSIS — I10 Essential (primary) hypertension: Secondary | ICD-10-CM | POA: Diagnosis not present

## 2023-04-13 DIAGNOSIS — G1221 Amyotrophic lateral sclerosis: Secondary | ICD-10-CM | POA: Diagnosis not present

## 2023-04-18 DIAGNOSIS — G3109 Other frontotemporal dementia: Secondary | ICD-10-CM | POA: Diagnosis not present

## 2023-04-18 DIAGNOSIS — R627 Adult failure to thrive: Secondary | ICD-10-CM | POA: Diagnosis not present

## 2023-04-18 DIAGNOSIS — I10 Essential (primary) hypertension: Secondary | ICD-10-CM | POA: Diagnosis not present

## 2023-04-18 DIAGNOSIS — F0280 Dementia in other diseases classified elsewhere without behavioral disturbance: Secondary | ICD-10-CM | POA: Diagnosis not present

## 2023-04-18 DIAGNOSIS — R4701 Aphasia: Secondary | ICD-10-CM | POA: Diagnosis not present

## 2023-04-18 DIAGNOSIS — R1310 Dysphagia, unspecified: Secondary | ICD-10-CM | POA: Diagnosis not present

## 2023-04-18 DIAGNOSIS — Z431 Encounter for attention to gastrostomy: Secondary | ICD-10-CM | POA: Diagnosis not present

## 2023-04-18 DIAGNOSIS — E46 Unspecified protein-calorie malnutrition: Secondary | ICD-10-CM | POA: Diagnosis not present

## 2023-04-18 DIAGNOSIS — G1221 Amyotrophic lateral sclerosis: Secondary | ICD-10-CM | POA: Diagnosis not present

## 2023-04-20 ENCOUNTER — Emergency Department (HOSPITAL_COMMUNITY)
Admission: EM | Admit: 2023-04-20 | Discharge: 2023-04-20 | Disposition: A | Discharge status: Home / Self Care | Payer: Medicare PPO | Attending: Emergency Medicine | Admitting: Emergency Medicine

## 2023-04-20 ENCOUNTER — Emergency Department (HOSPITAL_COMMUNITY): Payer: Medicare PPO

## 2023-04-20 DIAGNOSIS — R4701 Aphasia: Secondary | ICD-10-CM | POA: Diagnosis not present

## 2023-04-20 DIAGNOSIS — W19XXXA Unspecified fall, initial encounter: Secondary | ICD-10-CM

## 2023-04-20 DIAGNOSIS — E46 Unspecified protein-calorie malnutrition: Secondary | ICD-10-CM | POA: Diagnosis not present

## 2023-04-20 DIAGNOSIS — S199XXA Unspecified injury of neck, initial encounter: Secondary | ICD-10-CM | POA: Diagnosis not present

## 2023-04-20 DIAGNOSIS — S0101XA Laceration without foreign body of scalp, initial encounter: Secondary | ICD-10-CM | POA: Diagnosis not present

## 2023-04-20 DIAGNOSIS — G3109 Other frontotemporal dementia: Secondary | ICD-10-CM | POA: Diagnosis not present

## 2023-04-20 DIAGNOSIS — S0990XA Unspecified injury of head, initial encounter: Secondary | ICD-10-CM

## 2023-04-20 DIAGNOSIS — I6782 Cerebral ischemia: Secondary | ICD-10-CM | POA: Diagnosis not present

## 2023-04-20 DIAGNOSIS — I10 Essential (primary) hypertension: Secondary | ICD-10-CM | POA: Diagnosis not present

## 2023-04-20 DIAGNOSIS — Z431 Encounter for attention to gastrostomy: Secondary | ICD-10-CM | POA: Diagnosis not present

## 2023-04-20 DIAGNOSIS — R627 Adult failure to thrive: Secondary | ICD-10-CM | POA: Diagnosis not present

## 2023-04-20 DIAGNOSIS — R9089 Other abnormal findings on diagnostic imaging of central nervous system: Secondary | ICD-10-CM | POA: Diagnosis not present

## 2023-04-20 DIAGNOSIS — W1839XA Other fall on same level, initial encounter: Secondary | ICD-10-CM | POA: Insufficient documentation

## 2023-04-20 DIAGNOSIS — F0280 Dementia in other diseases classified elsewhere without behavioral disturbance: Secondary | ICD-10-CM | POA: Diagnosis not present

## 2023-04-20 DIAGNOSIS — R1310 Dysphagia, unspecified: Secondary | ICD-10-CM | POA: Diagnosis not present

## 2023-04-20 DIAGNOSIS — Z23 Encounter for immunization: Secondary | ICD-10-CM | POA: Insufficient documentation

## 2023-04-20 DIAGNOSIS — G1221 Amyotrophic lateral sclerosis: Secondary | ICD-10-CM | POA: Diagnosis not present

## 2023-04-20 MED ORDER — TETANUS-DIPHTH-ACELL PERTUSSIS 5-2.5-18.5 LF-MCG/0.5 IM SUSY
0.5000 mL | PREFILLED_SYRINGE | Freq: Once | INTRAMUSCULAR | Status: AC
  Administered 2023-04-20: 0.5 mL via INTRAMUSCULAR
  Filled 2023-04-20: qty 0.5

## 2023-04-20 MED ORDER — LIDOCAINE-EPINEPHRINE (PF) 2 %-1:200000 IJ SOLN
10.0000 mL | Freq: Once | INTRAMUSCULAR | Status: AC
  Administered 2023-04-20: 10 mL
  Filled 2023-04-20: qty 20

## 2023-04-20 NOTE — ED Provider Triage Note (Signed)
 Emergency Medicine Provider Triage Evaluation Note  Guy Payne , a 72 y.o. male  was evaluated in triage.  Pt complains of Fall.  Review of Systems  Positive: LOC, pain Negative: No thinners, N/V, numbness, weakness, tinnitus, blurry vision, photophobia, dizziness, neck pain  Physical Exam  BP 114/65 (BP Location: Right Arm)   Pulse 67   Temp 98.7 F (37.1 C) (Oral)   Resp 18   SpO2 98%  Gen:   Awake, no distress   Resp:  Normal effort  MSK:   Moves extremities without difficulty  Other:    Medical Decision Making  Medically screening exam initiated at 5:03 PM.  Appropriate orders placed.  Excell Seltzer Mcgillis was informed that the remainder of the evaluation will be completed by another provider, this initial triage assessment does not replace that evaluation, and the importance of remaining in the ED until their evaluation is complete.  Imaging ordered   Dolphus Jenny, PA-C 04/20/23 2841

## 2023-04-20 NOTE — Discharge Instructions (Addendum)
 You were seen in the emergency department for evaluation of injuries from a fall.  You had a CAT scan of your head and cervical spine that did not show any fracture or or internal bleeding.  You had a scalp laceration 3 staples were placed.  These will need to be removed in 7 to 10 days.  You may shower and get them wet.

## 2023-04-20 NOTE — ED Triage Notes (Signed)
 Pt presents with wife who states that he was with a caregiver at the mall playing pool when he fell and struck the back of his head on the ground. Pt is not on blood thinners, no LOC. Pt has hx of ALS and frontotemporal dementia. Alert during triage, able to answer yes/no questions with head nods. NAD noted during triage.

## 2023-04-20 NOTE — ED Provider Notes (Signed)
 [place] EMERGENCY DEPARTMENT AT [facility] Provider Note   CSN: [id number] Arrival date & time: 04/20/23  1651     History {Add pertinent medical, surgical, social history, OB history to HPI:1} Chief Complaint  Patient presents with   Guy Payne is a 73 y.o. male.  He is brought in by his wife who is giving history.  He has a history of ALS.  He was with his caregiver when he fell backwards hitting his head.  No reported loss of consciousness.  He is at his baseline per his wife.  I do not know the last time he had tetanus shot.  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   The history is provided by the spouse.  Fall This is a new problem. The current episode started 3 to 5 hours ago. The problem has not changed since onset.He has tried nothing for the symptoms. The treatment provided no relief.       Home Medications Prior to Admission medications   Medication Sig Start Date End Date Taking? Authorizing Provider  Ascorbic Acid (VITAMIN C PO) Take by mouth daily.    [provider]  B Complex Vitamins (B COMPLEX PO) Take by mouth daily.    [provider]  Calcium-Magnesium-Zinc (CAL-MAG-ZINC PO) Take by mouth daily.    [provider]  Cholecalciferol (VITAMIN D3 PO) Take by mouth daily.    [provider]  Coenzyme Q10 (CO Q 10 PO) Take by mouth daily.    [provider]  Cyanocobalamin (VITAMIN B-12 PO) Take by mouth.    [provider]  donepezil ([name]) 10 MG tablet TAKE 1 TABLET BY MOUTH EVERYDAY AT BEDTIME 11/14/19   Anson Fret, MD  Omega-3 Fatty Acids (FISH OIL PO) Take by mouth daily.    [provider]  Sulfacetamide Sodium-Sulfur 10-2 % CREA APPLY TO FACE AT BEDTIME 03/19/15   [provider]  UNABLE TO FIND daily. Med Name: MCT powder    [provider]  VITAMIN E PO Take by mouth daily.    [provider]      Allergies    Patient has  no known allergies.    Review of Systems   Review of Systems  Physical Exam Updated Vital Signs BP (!) 122/97 (BP Location: Right Arm)   Pulse 65   Temp 98.3 F (36.8 C) (Oral)   Resp 16   SpO2 100%  Physical Exam Vitals and nursing note reviewed.  Constitutional:      General: He is not in acute distress.    Appearance: Normal appearance. He is well-developed.  HENT:     Head: Normocephalic.     Comments: He has a 3 cm laceration to the crown of his head.  There is no active bleeding Eyes:     Conjunctiva/sclera: Conjunctivae normal.  Cardiovascular:     Rate and Rhythm: Normal rate and regular rhythm.     Heart sounds: No murmur heard. Pulmonary:     Effort: Pulmonary effort is normal. No respiratory distress.     Breath sounds: Normal breath sounds.  Abdominal:     Palpations: Abdomen is soft.     Tenderness: There is no abdominal tenderness.  Musculoskeletal:        General: No deformity.     Cervical back: Neck supple.  Skin:    General: Skin is warm and dry.     Capillary Refill: Capillary  refill takes less than 2 seconds.  Neurological:     Mental Status: He is alert.     Comments: Patient is awake alert but not conversant.  He is at baseline per his wife     ED Results / Procedures / Treatments   Labs (all labs ordered are listed, but only abnormal results are displayed) Labs Reviewed - No data to display  EKG None  Radiology CT Head Wo Contrast Result Date: 04/20/2023 CLINICAL DATA:  Trauma, fell back and struck head on ground. History of ALS and frontotemporal dementia. EXAM: CT HEAD WITHOUT CONTRAST CT CERVICAL SPINE WITHOUT CONTRAST TECHNIQUE: Multidetector CT imaging of the head and cervical spine was performed following the standard protocol without intravenous contrast. Multiplanar CT image reconstructions of the cervical spine were also generated. RADIATION DOSE REDUCTION: This exam was performed according to the departmental dose-optimization  program which includes automated exposure control, adjustment of the mA and/or kV according to patient size and/or use of iterative reconstruction technique. COMPARISON:  MRI head 12/12/2019 FINDINGS: CT HEAD FINDINGS Brain: No acute intracranial hemorrhage. No CT evidence of acute infarct. Nonspecific hypoattenuation in the periventricular and subcortical white matter favored to reflect chronic microvascular ischemic changes. Generalized parenchymal volume loss with marked superimposed bitemporal atrophy. No edema, mass effect, or midline shift. The basilar cisterns are patent. Unchanged 7 mm pineal cyst. Ventricles: Prominence of the ventricles suggesting underlying parenchymal volume loss. Vascular: No hyperdense vessel or unexpected calcification. Skull: No acute or aggressive finding. Orbits: Bilateral lens replacement.  Orbits are symmetric. Sinuses: The visualized paranasal sinuses are clear. Other: Mastoid air cells are clear. Focal irregularity of skin along the posterior left parietal scalp suggestive of laceration with mild underlying soft tissue swelling. CT CERVICAL SPINE FINDINGS Alignment: Straightening of the normal cervical lordosis. Trace anterolisthesis of C4 on C5 and of C7 on T1. No facet subluxation or dislocation. Skull base and vertebrae: No acute fracture. No primary bone lesion or focal pathologic process. Soft tissues and spinal canal: No prevertebral fluid or swelling. No visible canal hematoma. Disc levels: Moderate disc space narrowing at C4-5, C5-6, and C6-7. Disc bulge at C3-4 resulting in mild spinal canal stenosis. Disc osteophyte complexes at C4-5 through C6-7 with mild spinal canal stenosis. Facet arthrosis and uncovertebral hypertrophy at multiple levels. Foraminal narrowing most pronounced on the left at C4-5. Upper chest: Negative. Other: None. IMPRESSION: CT HEAD: 1. No acute intracranial abnormality. 2. Focal laceration with mild underlying soft tissue swelling in the left  parietal scalp. 3. Generalized parenchymal volume loss with marked superimposed bitemporal atrophy. 4. Mild chronic microvascular ischemic changes. CT CERVICAL SPINE: 1. No acute fracture or traumatic malalignment of the cervical spine. 2. Cervical spondylosis as above. Electronically Signed   By: Emily Filbert M.D.   On: 04/20/2023 18:27   CT Cervical Spine Wo Contrast Result Date: 04/20/2023 CLINICAL DATA:  Trauma, fell back and struck head on ground. History of ALS and frontotemporal dementia. EXAM: CT HEAD WITHOUT CONTRAST CT CERVICAL SPINE WITHOUT CONTRAST TECHNIQUE: Multidetector CT imaging of the head and cervical spine was performed following the standard protocol without intravenous contrast. Multiplanar CT image reconstructions of the cervical spine were also generated. RADIATION DOSE REDUCTION: This exam was performed according to the departmental dose-optimization program which includes automated exposure control, adjustment of the mA and/or kV according to patient size and/or use of iterative reconstruction technique. COMPARISON:  MRI head 12/12/2019 FINDINGS: CT HEAD FINDINGS Brain: No acute intracranial hemorrhage. No CT evidence of acute  infarct. Nonspecific hypoattenuation in the periventricular and subcortical white matter favored to reflect chronic microvascular ischemic changes. Generalized parenchymal volume loss with marked superimposed bitemporal atrophy. No edema, mass effect, or midline shift. The basilar cisterns are patent. Unchanged 7 mm pineal cyst. Ventricles: Prominence of the ventricles suggesting underlying parenchymal volume loss. Vascular: No hyperdense vessel or unexpected calcification. Skull: No acute or aggressive finding. Orbits: Bilateral lens replacement.  Orbits are symmetric. Sinuses: The visualized paranasal sinuses are clear. Other: Mastoid air cells are clear. Focal irregularity of skin along the posterior left parietal scalp suggestive of laceration with mild  underlying soft tissue swelling. CT CERVICAL SPINE FINDINGS Alignment: Straightening of the normal cervical lordosis. Trace anterolisthesis of C4 on C5 and of C7 on T1. No facet subluxation or dislocation. Skull base and vertebrae: No acute fracture. No primary bone lesion or focal pathologic process. Soft tissues and spinal canal: No prevertebral fluid or swelling. No visible canal hematoma. Disc levels: Moderate disc space narrowing at C4-5, C5-6, and C6-7. Disc bulge at C3-4 resulting in mild spinal canal stenosis. Disc osteophyte complexes at C4-5 through C6-7 with mild spinal canal stenosis. Facet arthrosis and uncovertebral hypertrophy at multiple levels. Foraminal narrowing most pronounced on the left at C4-5. Upper chest: Negative. Other: None. IMPRESSION: CT HEAD: 1. No acute intracranial abnormality. 2. Focal laceration with mild underlying soft tissue swelling in the left parietal scalp. 3. Generalized parenchymal volume loss with marked superimposed bitemporal atrophy. 4. Mild chronic microvascular ischemic changes. CT CERVICAL SPINE: 1. No acute fracture or traumatic malalignment of the cervical spine. 2. Cervical spondylosis as above. Electronically Signed   By: Emily Filbert M.D.   On: 04/20/2023 18:27    Procedures .Laceration Repair  Date/Time: 04/20/2023 8:23 PM  Performed by: Terrilee Files, MD Authorized by: Terrilee Files, MD   Consent:    Consent obtained:  Verbal   Consent given by:  Patient   Risks, benefits, and alternatives were discussed: yes     Risks discussed:  Infection, nerve damage, poor wound healing, pain, retained foreign body, tendon damage and vascular damage   Alternatives discussed:  No treatment, delayed treatment and referral Universal protocol:    Procedure explained and questions answered to patient or proxy's satisfaction: yes     Patient identity confirmed:  Verbally with patient Anesthesia:    Anesthesia method:  Local infiltration   Local  anesthetic:  Lidocaine 2% WITH epi Laceration details:    Location:  Scalp   Scalp location:  Crown   Length (cm):  4 Treatment:    Area cleansed with:  Saline   Amount of cleaning:  Standard   Irrigation solution:  Sterile saline Skin repair:    Repair method:  Staples   Number of staples:  3 Approximation:    Approximation:  Close Repair type:    Repair type:  Simple Post-procedure details:    Dressing:  Open (no dressing)   Procedure completion:  Tolerated well, no immediate complications   {Document cardiac monitor, telemetry assessment procedure when appropriate:1}  Medications Ordered in ED Medications  lidocaine-EPINEPHrine (XYLOCAINE W/EPI) 2 %-1:200000 (PF) injection 10 mL (has no administration in time range)  Tdap (BOOSTRIX) injection 0.5 mL (has no administration in time range)    ED Course/ Medical Decision Making/ A&P   {   Click here for ABCD2, HEART and other calculatorsREFRESH Note before signing :1}  Medical Decision Making Risk Prescription drug management.   This patient complains of ***; this involves an extensive number of treatment Options and is a complaint that carries with it a high risk of complications and morbidity. The differential includes ***  I ordered, reviewed and interpreted labs, which included *** I ordered medication *** and reviewed PMP when indicated. I ordered imaging studies which included *** and I independently    visualized and interpreted imaging which showed *** Additional history obtained from *** Previous records obtained and reviewed *** I consulted *** and discussed lab and imaging findings and discussed disposition.  Cardiac monitoring reviewed, *** Social determinants considered, *** Critical Interventions: ***  After the interventions stated above, I reevaluated the patient and found *** Admission and further testing considered, ***   {Document critical care time when  appropriate:1} {Document review of labs and clinical decision tools ie heart score, Chads2Vasc2 etc:1}  {Document your independent review of radiology images, and any outside records:1} {Document your discussion with family members, caretakers, and with consultants:1} {Document social determinants of health affecting pt's care:1} {Document your decision making why or why not admission, treatments were needed:1} Final Clinical Impression(s) / ED Diagnoses Final diagnoses:  None    Rx / DC Orders ED Discharge Orders     None

## 2023-04-21 DIAGNOSIS — F0280 Dementia in other diseases classified elsewhere without behavioral disturbance: Secondary | ICD-10-CM | POA: Diagnosis not present

## 2023-04-21 DIAGNOSIS — G1221 Amyotrophic lateral sclerosis: Secondary | ICD-10-CM | POA: Diagnosis not present

## 2023-04-21 DIAGNOSIS — G3109 Other frontotemporal dementia: Secondary | ICD-10-CM | POA: Diagnosis not present

## 2023-04-21 DIAGNOSIS — I10 Essential (primary) hypertension: Secondary | ICD-10-CM | POA: Diagnosis not present

## 2023-04-21 DIAGNOSIS — E46 Unspecified protein-calorie malnutrition: Secondary | ICD-10-CM | POA: Diagnosis not present

## 2023-04-21 DIAGNOSIS — Z431 Encounter for attention to gastrostomy: Secondary | ICD-10-CM | POA: Diagnosis not present

## 2023-04-21 DIAGNOSIS — R4701 Aphasia: Secondary | ICD-10-CM | POA: Diagnosis not present

## 2023-04-21 DIAGNOSIS — R1310 Dysphagia, unspecified: Secondary | ICD-10-CM | POA: Diagnosis not present

## 2023-04-21 DIAGNOSIS — R627 Adult failure to thrive: Secondary | ICD-10-CM | POA: Diagnosis not present

## 2023-04-23 DIAGNOSIS — R1312 Dysphagia, oropharyngeal phase: Secondary | ICD-10-CM | POA: Diagnosis not present

## 2023-04-23 DIAGNOSIS — G1221 Amyotrophic lateral sclerosis: Secondary | ICD-10-CM | POA: Diagnosis not present

## 2023-04-25 DIAGNOSIS — Z431 Encounter for attention to gastrostomy: Secondary | ICD-10-CM | POA: Diagnosis not present

## 2023-04-25 DIAGNOSIS — G3109 Other frontotemporal dementia: Secondary | ICD-10-CM | POA: Diagnosis not present

## 2023-04-25 DIAGNOSIS — R1310 Dysphagia, unspecified: Secondary | ICD-10-CM | POA: Diagnosis not present

## 2023-04-25 DIAGNOSIS — E46 Unspecified protein-calorie malnutrition: Secondary | ICD-10-CM | POA: Diagnosis not present

## 2023-04-25 DIAGNOSIS — G1221 Amyotrophic lateral sclerosis: Secondary | ICD-10-CM | POA: Diagnosis not present

## 2023-04-25 DIAGNOSIS — F0280 Dementia in other diseases classified elsewhere without behavioral disturbance: Secondary | ICD-10-CM | POA: Diagnosis not present

## 2023-04-25 DIAGNOSIS — I10 Essential (primary) hypertension: Secondary | ICD-10-CM | POA: Diagnosis not present

## 2023-04-25 DIAGNOSIS — R4701 Aphasia: Secondary | ICD-10-CM | POA: Diagnosis not present

## 2023-04-25 DIAGNOSIS — R627 Adult failure to thrive: Secondary | ICD-10-CM | POA: Diagnosis not present

## 2023-04-26 DIAGNOSIS — Z789 Other specified health status: Secondary | ICD-10-CM | POA: Diagnosis not present

## 2023-04-26 DIAGNOSIS — G1221 Amyotrophic lateral sclerosis: Secondary | ICD-10-CM | POA: Diagnosis not present

## 2023-04-26 DIAGNOSIS — R29898 Other symptoms and signs involving the musculoskeletal system: Secondary | ICD-10-CM | POA: Diagnosis not present

## 2023-04-26 DIAGNOSIS — Z7409 Other reduced mobility: Secondary | ICD-10-CM | POA: Diagnosis not present

## 2023-04-26 DIAGNOSIS — G3109 Other frontotemporal dementia: Secondary | ICD-10-CM | POA: Diagnosis not present

## 2023-04-26 DIAGNOSIS — R1312 Dysphagia, oropharyngeal phase: Secondary | ICD-10-CM | POA: Diagnosis not present

## 2023-04-26 DIAGNOSIS — F0280 Dementia in other diseases classified elsewhere without behavioral disturbance: Secondary | ICD-10-CM | POA: Diagnosis not present

## 2023-04-26 DIAGNOSIS — Z008 Encounter for other general examination: Secondary | ICD-10-CM | POA: Diagnosis not present

## 2023-04-27 DIAGNOSIS — G3109 Other frontotemporal dementia: Secondary | ICD-10-CM | POA: Diagnosis not present

## 2023-04-27 DIAGNOSIS — R1310 Dysphagia, unspecified: Secondary | ICD-10-CM | POA: Diagnosis not present

## 2023-04-27 DIAGNOSIS — F0280 Dementia in other diseases classified elsewhere without behavioral disturbance: Secondary | ICD-10-CM | POA: Diagnosis not present

## 2023-04-27 DIAGNOSIS — E46 Unspecified protein-calorie malnutrition: Secondary | ICD-10-CM | POA: Diagnosis not present

## 2023-04-27 DIAGNOSIS — Z431 Encounter for attention to gastrostomy: Secondary | ICD-10-CM | POA: Diagnosis not present

## 2023-04-27 DIAGNOSIS — R4701 Aphasia: Secondary | ICD-10-CM | POA: Diagnosis not present

## 2023-04-27 DIAGNOSIS — R627 Adult failure to thrive: Secondary | ICD-10-CM | POA: Diagnosis not present

## 2023-04-27 DIAGNOSIS — I10 Essential (primary) hypertension: Secondary | ICD-10-CM | POA: Diagnosis not present

## 2023-04-27 DIAGNOSIS — G1221 Amyotrophic lateral sclerosis: Secondary | ICD-10-CM | POA: Diagnosis not present

## 2023-04-28 DIAGNOSIS — S0181XD Laceration without foreign body of other part of head, subsequent encounter: Secondary | ICD-10-CM | POA: Diagnosis not present

## 2023-04-28 DIAGNOSIS — Z4802 Encounter for removal of sutures: Secondary | ICD-10-CM | POA: Diagnosis not present

## 2023-05-01 DIAGNOSIS — R1310 Dysphagia, unspecified: Secondary | ICD-10-CM | POA: Diagnosis not present

## 2023-05-01 DIAGNOSIS — G1221 Amyotrophic lateral sclerosis: Secondary | ICD-10-CM | POA: Diagnosis not present

## 2023-05-01 DIAGNOSIS — R627 Adult failure to thrive: Secondary | ICD-10-CM | POA: Diagnosis not present

## 2023-05-01 DIAGNOSIS — Z431 Encounter for attention to gastrostomy: Secondary | ICD-10-CM | POA: Diagnosis not present

## 2023-05-01 DIAGNOSIS — G3109 Other frontotemporal dementia: Secondary | ICD-10-CM | POA: Diagnosis not present

## 2023-05-01 DIAGNOSIS — I10 Essential (primary) hypertension: Secondary | ICD-10-CM | POA: Diagnosis not present

## 2023-05-01 DIAGNOSIS — E46 Unspecified protein-calorie malnutrition: Secondary | ICD-10-CM | POA: Diagnosis not present

## 2023-05-01 DIAGNOSIS — F0280 Dementia in other diseases classified elsewhere without behavioral disturbance: Secondary | ICD-10-CM | POA: Diagnosis not present

## 2023-05-01 DIAGNOSIS — R4701 Aphasia: Secondary | ICD-10-CM | POA: Diagnosis not present

## 2023-05-03 DIAGNOSIS — R1310 Dysphagia, unspecified: Secondary | ICD-10-CM | POA: Diagnosis not present

## 2023-05-03 DIAGNOSIS — Z431 Encounter for attention to gastrostomy: Secondary | ICD-10-CM | POA: Diagnosis not present

## 2023-05-03 DIAGNOSIS — F0280 Dementia in other diseases classified elsewhere without behavioral disturbance: Secondary | ICD-10-CM | POA: Diagnosis not present

## 2023-05-03 DIAGNOSIS — R627 Adult failure to thrive: Secondary | ICD-10-CM | POA: Diagnosis not present

## 2023-05-03 DIAGNOSIS — I10 Essential (primary) hypertension: Secondary | ICD-10-CM | POA: Diagnosis not present

## 2023-05-03 DIAGNOSIS — E46 Unspecified protein-calorie malnutrition: Secondary | ICD-10-CM | POA: Diagnosis not present

## 2023-05-03 DIAGNOSIS — G1221 Amyotrophic lateral sclerosis: Secondary | ICD-10-CM | POA: Diagnosis not present

## 2023-05-03 DIAGNOSIS — G3109 Other frontotemporal dementia: Secondary | ICD-10-CM | POA: Diagnosis not present

## 2023-05-03 DIAGNOSIS — R4701 Aphasia: Secondary | ICD-10-CM | POA: Diagnosis not present

## 2023-05-04 DIAGNOSIS — E46 Unspecified protein-calorie malnutrition: Secondary | ICD-10-CM | POA: Diagnosis not present

## 2023-05-04 DIAGNOSIS — R627 Adult failure to thrive: Secondary | ICD-10-CM | POA: Diagnosis not present

## 2023-05-04 DIAGNOSIS — R1310 Dysphagia, unspecified: Secondary | ICD-10-CM | POA: Diagnosis not present

## 2023-05-04 DIAGNOSIS — G3109 Other frontotemporal dementia: Secondary | ICD-10-CM | POA: Diagnosis not present

## 2023-05-04 DIAGNOSIS — Z431 Encounter for attention to gastrostomy: Secondary | ICD-10-CM | POA: Diagnosis not present

## 2023-05-04 DIAGNOSIS — F0280 Dementia in other diseases classified elsewhere without behavioral disturbance: Secondary | ICD-10-CM | POA: Diagnosis not present

## 2023-05-04 DIAGNOSIS — G1221 Amyotrophic lateral sclerosis: Secondary | ICD-10-CM | POA: Diagnosis not present

## 2023-05-04 DIAGNOSIS — R4701 Aphasia: Secondary | ICD-10-CM | POA: Diagnosis not present

## 2023-05-04 DIAGNOSIS — I10 Essential (primary) hypertension: Secondary | ICD-10-CM | POA: Diagnosis not present

## 2023-05-05 DIAGNOSIS — G1221 Amyotrophic lateral sclerosis: Secondary | ICD-10-CM | POA: Diagnosis not present

## 2023-05-05 DIAGNOSIS — R627 Adult failure to thrive: Secondary | ICD-10-CM | POA: Diagnosis not present

## 2023-05-05 DIAGNOSIS — E46 Unspecified protein-calorie malnutrition: Secondary | ICD-10-CM | POA: Diagnosis not present

## 2023-05-05 DIAGNOSIS — R1310 Dysphagia, unspecified: Secondary | ICD-10-CM | POA: Diagnosis not present

## 2023-05-05 DIAGNOSIS — R4701 Aphasia: Secondary | ICD-10-CM | POA: Diagnosis not present

## 2023-05-05 DIAGNOSIS — I10 Essential (primary) hypertension: Secondary | ICD-10-CM | POA: Diagnosis not present

## 2023-05-05 DIAGNOSIS — G3109 Other frontotemporal dementia: Secondary | ICD-10-CM | POA: Diagnosis not present

## 2023-05-05 DIAGNOSIS — F0280 Dementia in other diseases classified elsewhere without behavioral disturbance: Secondary | ICD-10-CM | POA: Diagnosis not present

## 2023-05-05 DIAGNOSIS — Z431 Encounter for attention to gastrostomy: Secondary | ICD-10-CM | POA: Diagnosis not present

## 2023-05-06 DIAGNOSIS — I10 Essential (primary) hypertension: Secondary | ICD-10-CM | POA: Diagnosis not present

## 2023-05-06 DIAGNOSIS — G3109 Other frontotemporal dementia: Secondary | ICD-10-CM | POA: Diagnosis not present

## 2023-05-06 DIAGNOSIS — Z431 Encounter for attention to gastrostomy: Secondary | ICD-10-CM | POA: Diagnosis not present

## 2023-05-06 DIAGNOSIS — E46 Unspecified protein-calorie malnutrition: Secondary | ICD-10-CM | POA: Diagnosis not present

## 2023-05-06 DIAGNOSIS — R4701 Aphasia: Secondary | ICD-10-CM | POA: Diagnosis not present

## 2023-05-06 DIAGNOSIS — G1221 Amyotrophic lateral sclerosis: Secondary | ICD-10-CM | POA: Diagnosis not present

## 2023-05-06 DIAGNOSIS — R627 Adult failure to thrive: Secondary | ICD-10-CM | POA: Diagnosis not present

## 2023-05-06 DIAGNOSIS — R1310 Dysphagia, unspecified: Secondary | ICD-10-CM | POA: Diagnosis not present

## 2023-05-06 DIAGNOSIS — F0280 Dementia in other diseases classified elsewhere without behavioral disturbance: Secondary | ICD-10-CM | POA: Diagnosis not present

## 2023-05-08 DIAGNOSIS — R4701 Aphasia: Secondary | ICD-10-CM | POA: Diagnosis not present

## 2023-05-08 DIAGNOSIS — I10 Essential (primary) hypertension: Secondary | ICD-10-CM | POA: Diagnosis not present

## 2023-05-08 DIAGNOSIS — G3109 Other frontotemporal dementia: Secondary | ICD-10-CM | POA: Diagnosis not present

## 2023-05-08 DIAGNOSIS — R1310 Dysphagia, unspecified: Secondary | ICD-10-CM | POA: Diagnosis not present

## 2023-05-08 DIAGNOSIS — E46 Unspecified protein-calorie malnutrition: Secondary | ICD-10-CM | POA: Diagnosis not present

## 2023-05-08 DIAGNOSIS — R627 Adult failure to thrive: Secondary | ICD-10-CM | POA: Diagnosis not present

## 2023-05-08 DIAGNOSIS — G1221 Amyotrophic lateral sclerosis: Secondary | ICD-10-CM | POA: Diagnosis not present

## 2023-05-08 DIAGNOSIS — Z431 Encounter for attention to gastrostomy: Secondary | ICD-10-CM | POA: Diagnosis not present

## 2023-05-08 DIAGNOSIS — F0280 Dementia in other diseases classified elsewhere without behavioral disturbance: Secondary | ICD-10-CM | POA: Diagnosis not present

## 2023-05-10 DIAGNOSIS — E46 Unspecified protein-calorie malnutrition: Secondary | ICD-10-CM | POA: Diagnosis not present

## 2023-05-10 DIAGNOSIS — G1221 Amyotrophic lateral sclerosis: Secondary | ICD-10-CM | POA: Diagnosis not present

## 2023-05-10 DIAGNOSIS — F0280 Dementia in other diseases classified elsewhere without behavioral disturbance: Secondary | ICD-10-CM | POA: Diagnosis not present

## 2023-05-10 DIAGNOSIS — I10 Essential (primary) hypertension: Secondary | ICD-10-CM | POA: Diagnosis not present

## 2023-05-10 DIAGNOSIS — R627 Adult failure to thrive: Secondary | ICD-10-CM | POA: Diagnosis not present

## 2023-05-10 DIAGNOSIS — R4701 Aphasia: Secondary | ICD-10-CM | POA: Diagnosis not present

## 2023-05-10 DIAGNOSIS — Z431 Encounter for attention to gastrostomy: Secondary | ICD-10-CM | POA: Diagnosis not present

## 2023-05-10 DIAGNOSIS — R1310 Dysphagia, unspecified: Secondary | ICD-10-CM | POA: Diagnosis not present

## 2023-05-10 DIAGNOSIS — G3109 Other frontotemporal dementia: Secondary | ICD-10-CM | POA: Diagnosis not present

## 2023-05-11 DIAGNOSIS — G1221 Amyotrophic lateral sclerosis: Secondary | ICD-10-CM | POA: Diagnosis not present

## 2023-05-12 DIAGNOSIS — R1310 Dysphagia, unspecified: Secondary | ICD-10-CM | POA: Diagnosis not present

## 2023-05-12 DIAGNOSIS — G1221 Amyotrophic lateral sclerosis: Secondary | ICD-10-CM | POA: Diagnosis not present

## 2023-05-12 DIAGNOSIS — Z431 Encounter for attention to gastrostomy: Secondary | ICD-10-CM | POA: Diagnosis not present

## 2023-05-12 DIAGNOSIS — R627 Adult failure to thrive: Secondary | ICD-10-CM | POA: Diagnosis not present

## 2023-05-12 DIAGNOSIS — R4701 Aphasia: Secondary | ICD-10-CM | POA: Diagnosis not present

## 2023-05-12 DIAGNOSIS — G3109 Other frontotemporal dementia: Secondary | ICD-10-CM | POA: Diagnosis not present

## 2023-05-12 DIAGNOSIS — I10 Essential (primary) hypertension: Secondary | ICD-10-CM | POA: Diagnosis not present

## 2023-05-12 DIAGNOSIS — F0280 Dementia in other diseases classified elsewhere without behavioral disturbance: Secondary | ICD-10-CM | POA: Diagnosis not present

## 2023-05-12 DIAGNOSIS — E46 Unspecified protein-calorie malnutrition: Secondary | ICD-10-CM | POA: Diagnosis not present

## 2023-05-15 DIAGNOSIS — I10 Essential (primary) hypertension: Secondary | ICD-10-CM | POA: Diagnosis not present

## 2023-05-15 DIAGNOSIS — Z431 Encounter for attention to gastrostomy: Secondary | ICD-10-CM | POA: Diagnosis not present

## 2023-05-15 DIAGNOSIS — F0280 Dementia in other diseases classified elsewhere without behavioral disturbance: Secondary | ICD-10-CM | POA: Diagnosis not present

## 2023-05-15 DIAGNOSIS — R627 Adult failure to thrive: Secondary | ICD-10-CM | POA: Diagnosis not present

## 2023-05-15 DIAGNOSIS — G1221 Amyotrophic lateral sclerosis: Secondary | ICD-10-CM | POA: Diagnosis not present

## 2023-05-15 DIAGNOSIS — E46 Unspecified protein-calorie malnutrition: Secondary | ICD-10-CM | POA: Diagnosis not present

## 2023-05-15 DIAGNOSIS — G3109 Other frontotemporal dementia: Secondary | ICD-10-CM | POA: Diagnosis not present

## 2023-05-15 DIAGNOSIS — R1310 Dysphagia, unspecified: Secondary | ICD-10-CM | POA: Diagnosis not present

## 2023-05-15 DIAGNOSIS — R4701 Aphasia: Secondary | ICD-10-CM | POA: Diagnosis not present

## 2023-05-16 DIAGNOSIS — R1310 Dysphagia, unspecified: Secondary | ICD-10-CM | POA: Diagnosis not present

## 2023-05-16 DIAGNOSIS — R4701 Aphasia: Secondary | ICD-10-CM | POA: Diagnosis not present

## 2023-05-16 DIAGNOSIS — Z431 Encounter for attention to gastrostomy: Secondary | ICD-10-CM | POA: Diagnosis not present

## 2023-05-16 DIAGNOSIS — R627 Adult failure to thrive: Secondary | ICD-10-CM | POA: Diagnosis not present

## 2023-05-16 DIAGNOSIS — G1221 Amyotrophic lateral sclerosis: Secondary | ICD-10-CM | POA: Diagnosis not present

## 2023-05-16 DIAGNOSIS — G3109 Other frontotemporal dementia: Secondary | ICD-10-CM | POA: Diagnosis not present

## 2023-05-16 DIAGNOSIS — E46 Unspecified protein-calorie malnutrition: Secondary | ICD-10-CM | POA: Diagnosis not present

## 2023-05-16 DIAGNOSIS — I10 Essential (primary) hypertension: Secondary | ICD-10-CM | POA: Diagnosis not present

## 2023-05-16 DIAGNOSIS — F0280 Dementia in other diseases classified elsewhere without behavioral disturbance: Secondary | ICD-10-CM | POA: Diagnosis not present

## 2023-05-17 DIAGNOSIS — R1312 Dysphagia, oropharyngeal phase: Secondary | ICD-10-CM | POA: Diagnosis not present

## 2023-05-17 DIAGNOSIS — I10 Essential (primary) hypertension: Secondary | ICD-10-CM | POA: Diagnosis not present

## 2023-05-17 DIAGNOSIS — F02818 Dementia in other diseases classified elsewhere, unspecified severity, with other behavioral disturbance: Secondary | ICD-10-CM | POA: Diagnosis not present

## 2023-05-17 DIAGNOSIS — G4761 Periodic limb movement disorder: Secondary | ICD-10-CM | POA: Diagnosis not present

## 2023-05-17 DIAGNOSIS — Z431 Encounter for attention to gastrostomy: Secondary | ICD-10-CM | POA: Diagnosis not present

## 2023-05-17 DIAGNOSIS — E46 Unspecified protein-calorie malnutrition: Secondary | ICD-10-CM | POA: Diagnosis not present

## 2023-05-17 DIAGNOSIS — G3109 Other frontotemporal dementia: Secondary | ICD-10-CM | POA: Diagnosis not present

## 2023-05-17 DIAGNOSIS — G1221 Amyotrophic lateral sclerosis: Secondary | ICD-10-CM | POA: Diagnosis not present

## 2023-05-17 DIAGNOSIS — R4701 Aphasia: Secondary | ICD-10-CM | POA: Diagnosis not present

## 2023-05-18 DIAGNOSIS — R1312 Dysphagia, oropharyngeal phase: Secondary | ICD-10-CM | POA: Diagnosis not present

## 2023-05-18 DIAGNOSIS — F02818 Dementia in other diseases classified elsewhere, unspecified severity, with other behavioral disturbance: Secondary | ICD-10-CM | POA: Diagnosis not present

## 2023-05-18 DIAGNOSIS — R4701 Aphasia: Secondary | ICD-10-CM | POA: Diagnosis not present

## 2023-05-18 DIAGNOSIS — I10 Essential (primary) hypertension: Secondary | ICD-10-CM | POA: Diagnosis not present

## 2023-05-18 DIAGNOSIS — G3109 Other frontotemporal dementia: Secondary | ICD-10-CM | POA: Diagnosis not present

## 2023-05-18 DIAGNOSIS — G1221 Amyotrophic lateral sclerosis: Secondary | ICD-10-CM | POA: Diagnosis not present

## 2023-05-18 DIAGNOSIS — Z431 Encounter for attention to gastrostomy: Secondary | ICD-10-CM | POA: Diagnosis not present

## 2023-05-18 DIAGNOSIS — E46 Unspecified protein-calorie malnutrition: Secondary | ICD-10-CM | POA: Diagnosis not present

## 2023-05-18 DIAGNOSIS — G4761 Periodic limb movement disorder: Secondary | ICD-10-CM | POA: Diagnosis not present

## 2023-05-19 ENCOUNTER — Ambulatory Visit: Payer: Self-pay

## 2023-05-19 ENCOUNTER — Emergency Department (HOSPITAL_BASED_OUTPATIENT_CLINIC_OR_DEPARTMENT_OTHER)
Admission: EM | Admit: 2023-05-19 | Discharge: 2023-05-19 | Disposition: A | Discharge status: Home / Self Care | Attending: Emergency Medicine | Admitting: Emergency Medicine

## 2023-05-19 ENCOUNTER — Emergency Department (HOSPITAL_BASED_OUTPATIENT_CLINIC_OR_DEPARTMENT_OTHER)

## 2023-05-19 ENCOUNTER — Encounter (HOSPITAL_BASED_OUTPATIENT_CLINIC_OR_DEPARTMENT_OTHER): Payer: Self-pay | Admitting: Emergency Medicine

## 2023-05-19 ENCOUNTER — Other Ambulatory Visit: Payer: Self-pay

## 2023-05-19 DIAGNOSIS — R052 Subacute cough: Secondary | ICD-10-CM

## 2023-05-19 DIAGNOSIS — I10 Essential (primary) hypertension: Secondary | ICD-10-CM | POA: Diagnosis not present

## 2023-05-19 DIAGNOSIS — G4761 Periodic limb movement disorder: Secondary | ICD-10-CM | POA: Diagnosis not present

## 2023-05-19 DIAGNOSIS — Z931 Gastrostomy status: Secondary | ICD-10-CM | POA: Diagnosis not present

## 2023-05-19 DIAGNOSIS — R059 Cough, unspecified: Secondary | ICD-10-CM | POA: Diagnosis not present

## 2023-05-19 DIAGNOSIS — G3109 Other frontotemporal dementia: Secondary | ICD-10-CM | POA: Diagnosis not present

## 2023-05-19 DIAGNOSIS — G1221 Amyotrophic lateral sclerosis: Secondary | ICD-10-CM

## 2023-05-19 DIAGNOSIS — R1312 Dysphagia, oropharyngeal phase: Secondary | ICD-10-CM | POA: Diagnosis not present

## 2023-05-19 DIAGNOSIS — F02818 Dementia in other diseases classified elsewhere, unspecified severity, with other behavioral disturbance: Secondary | ICD-10-CM | POA: Diagnosis not present

## 2023-05-19 DIAGNOSIS — E46 Unspecified protein-calorie malnutrition: Secondary | ICD-10-CM | POA: Diagnosis not present

## 2023-05-19 DIAGNOSIS — Z431 Encounter for attention to gastrostomy: Secondary | ICD-10-CM | POA: Diagnosis not present

## 2023-05-19 DIAGNOSIS — R4701 Aphasia: Secondary | ICD-10-CM | POA: Diagnosis not present

## 2023-05-19 HISTORY — DX: Amyotrophic lateral sclerosis: G12.21

## 2023-05-19 LAB — URINALYSIS, ROUTINE W REFLEX MICROSCOPIC
Bilirubin Urine: NEGATIVE
Glucose, UA: NEGATIVE mg/dL
Hgb urine dipstick: NEGATIVE
Ketones, ur: 15 mg/dL — AB
Leukocytes,Ua: NEGATIVE
Nitrite: NEGATIVE
Specific Gravity, Urine: 1.028 (ref 1.005–1.030)
pH: 5.5 (ref 5.0–8.0)

## 2023-05-19 LAB — CBC WITH DIFFERENTIAL/PLATELET
Abs Immature Granulocytes: 0.03 10*3/uL (ref 0.00–0.07)
Basophils Absolute: 0.1 10*3/uL (ref 0.0–0.1)
Basophils Relative: 1 %
Eosinophils Absolute: 0.3 10*3/uL (ref 0.0–0.5)
Eosinophils Relative: 4 %
HCT: 41.2 % (ref 39.0–52.0)
Hemoglobin: 13.7 g/dL (ref 13.0–17.0)
Immature Granulocytes: 0 %
Lymphocytes Relative: 12 %
Lymphs Abs: 1 10*3/uL (ref 0.7–4.0)
MCH: 31.9 pg (ref 26.0–34.0)
MCHC: 33.3 g/dL (ref 30.0–36.0)
MCV: 95.8 fL (ref 80.0–100.0)
Monocytes Absolute: 0.7 10*3/uL (ref 0.1–1.0)
Monocytes Relative: 8 %
Neutro Abs: 6.2 10*3/uL (ref 1.7–7.7)
Neutrophils Relative %: 75 %
Platelets: 223 10*3/uL (ref 150–400)
RBC: 4.3 MIL/uL (ref 4.22–5.81)
RDW: 13.3 % (ref 11.5–15.5)
WBC: 8.2 10*3/uL (ref 4.0–10.5)
nRBC: 0 % (ref 0.0–0.2)

## 2023-05-19 LAB — BASIC METABOLIC PANEL WITH GFR
Anion gap: 11 (ref 5–15)
BUN: 27 mg/dL — ABNORMAL HIGH (ref 8–23)
CO2: 28 mmol/L (ref 22–32)
Calcium: 9.4 mg/dL (ref 8.9–10.3)
Chloride: 104 mmol/L (ref 98–111)
Creatinine, Ser: 0.8 mg/dL (ref 0.61–1.24)
GFR, Estimated: >60 mL/min (ref >60)
Glucose, Bld: 92 mg/dL (ref 70–99)
Potassium: 4.2 mmol/L (ref 3.5–5.1)
Sodium: 143 mmol/L (ref 135–145)

## 2023-05-19 NOTE — ED Notes (Signed)
 Oral suctioning performed before DC.Pt verbalized understanding of d/c instructions, meds, and followup care. Denies questions. VSS, no distress noted. Steady gait with walker to exit with all belongings.

## 2023-05-19 NOTE — ED Provider Notes (Signed)
 [place] EMERGENCY DEPARTMENT AT [facility] Provider Note   CSN: [id number] Arrival date & time: 05/19/23  1423     History  Chief Complaint  Patient presents with   Aspiration    Guy Payne is a 73 y.o. male.  The history is provided by the patient and the spouse. The history is limited by the condition of the patient.  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 Blain Pais.  Has been having coughing episodes x 1 week whenever he is lying back or whenever PEG tube was utilized for feedings.  Episodes of coughing last anywhere between 2 to 6 hours.  Patient was prescribed a CoughAssist machine but has refused to use it since last Saturday due to having reported 9 hours of groaning after using machine.  Was supposed to have a team come by the house today to provide another CoughAssist machine, but they canceled their appointment, patient and wife then decided they would just come to the ED to "take things into my own hands."  Patient notes that he is having some mild burning like sensations around central abdomen.  Patient denies headaches, fevers, shortness of breath, chest pain, diarrhea, dysuria, lower leg swelling.     Home Medications Prior to Admission medications   Medication Sig Start Date End Date Taking? Authorizing Provider  Ascorbic Acid (VITAMIN C PO) Take by mouth daily.    [provider]  B Complex Vitamins (B COMPLEX PO) Take by mouth daily.    [provider]  Calcium-Magnesium-Zinc (CAL-MAG-ZINC PO) Take by mouth daily.    [provider]  Cholecalciferol (VITAMIN D3 PO) Take by mouth daily.    [provider]  Coenzyme Q10 (CO Q 10 PO) Take by mouth daily.    [provider]  Cyanocobalamin (VITAMIN B-12 PO) Take by mouth.    [provider]  donepezil ([name]) 10 MG tablet TAKE 1 TABLET  BY MOUTH EVERYDAY AT BEDTIME 11/14/19   Anson Fret, MD  Omega-3 Fatty Acids (FISH OIL PO) Take by mouth daily.    [provider]  Sulfacetamide Sodium-Sulfur 10-2 % CREA APPLY TO FACE AT BEDTIME 03/19/15   [provider]  UNABLE TO FIND daily. Med Name: MCT powder    [provider]  VITAMIN E PO Take by mouth daily.    [provider]      Allergies    Patient has no known allergies.    Review of Systems   Review of Systems  Respiratory:  Positive for cough.   All other systems reviewed and are negative.   Physical Exam Updated Vital Signs BP (!) 172/103   Pulse 71   Temp 98.4 F (36.9 C) (Oral)   Resp 19   Ht 5\' 8"  (1.727 m)   Wt 58.2 kg   SpO2 96%   BMI 19.49 kg/m  Physical Exam Vitals and nursing note reviewed.  Constitutional:      General: He is not in acute distress.    Appearance: Normal appearance. He is not ill-appearing.  HENT:     Head: Normocephalic and atraumatic.  Eyes:     General: No scleral icterus.       Right eye: No discharge.        Left eye: No discharge.     Extraocular Movements: Extraocular movements intact.     Conjunctiva/sclera: Conjunctivae  normal.  Cardiovascular:     Rate and Rhythm: Normal rate and regular rhythm.     Pulses: Normal pulses.     Heart sounds: Normal heart sounds. No murmur heard.    No friction rub. No gallop.  Pulmonary:     Effort: Pulmonary effort is normal. No respiratory distress.     Breath sounds: Normal breath sounds. No stridor. No wheezing, rhonchi or rales.  Chest:     Chest wall: No tenderness.  Abdominal:     General: Abdomen is flat. There is no distension.     Palpations: Abdomen is soft.     Tenderness: There is no abdominal tenderness. There is no right CVA tenderness, left CVA tenderness or guarding.     Comments: PEG tube in place with no erythema or surrounding signs of infection at site  Musculoskeletal:     Cervical back: No rigidity or tenderness.   Skin:    General: Skin is warm and dry.     Findings: No bruising, erythema or rash.  Neurological:     General: No focal deficit present.     Mental Status: He is alert. Mental status is at baseline.     Comments: Patient at baseline with ALS, nonverbal.  Noted to have generalized weakness.  Psychiatric:        Mood and Affect: Mood normal.     ED Results / Procedures / Treatments   Labs (all labs ordered are listed, but only abnormal results are displayed) Labs Reviewed  BASIC METABOLIC PANEL WITH GFR - Abnormal; Notable for the following components:      Result Value   BUN 27 (*)    All other components within normal limits  URINALYSIS, ROUTINE W REFLEX MICROSCOPIC - Abnormal; Notable for the following components:   Ketones, ur 15 (*)    Protein, ur TRACE (*)    All other components within normal limits  CBC WITH DIFFERENTIAL/PLATELET    EKG None  Radiology DG Chest Port 1 View Result Date: 05/19/2023 CLINICAL DATA:  Cough, aspiration EXAM: PORTABLE CHEST 1 VIEW COMPARISON:  12/24/2009 FINDINGS: Heart and mediastinal contours are within normal limits. No focal opacities or effusions. No acute bony abnormality. IMPRESSION: No active cardiopulmonary disease. Electronically Signed   By: Charlett Nose M.D.   On: 05/19/2023 18:14   DG ABDOMEN PEG TUBE LOCATION Result Date: 05/19/2023 CLINICAL DATA:  [id number] Aspiration into airway, initial encounter [id number] Dislodged gastrostomy tube [id number] EXAM: ABDOMEN - 1 VIEW COMPARISON:  None available FINDINGS: Gastrostomy tube projects over the left upper quadrant and stomach. Contrast noted within the stomach. No contrast extravasation. No free air or evidence of bowel obstruction. No organomegaly. IMPRESSION: Gastrostomy tube within the stomach. No evidence of contrast extravasation. Electronically Signed   By: Charlett Nose M.D.   On: 05/19/2023 18:14    Procedures Procedures    Medications Ordered in ED Medications - No data  to display  ED Course/ Medical Decision Making/ A&P                                 Medical Decision Making Amount and/or Complexity of Data Reviewed Labs: ordered. Radiology: ordered.   This patient is a 73 year old male who presents with spouse to the ED for concern of coughing fits x 1 week with a previous medical history of ALS.  Spouse notes that they had stopped using CoughAssist last week due to  having a 9-hour episode of reported groaning.  Patient currently does not report any shortness of breath, chest pain, or fever.  Does report some mild central abdominal burning.  On exam, patient is noted to be alert, no acute distress, afebrile.  LCTAB.  Skin around PEG tube without signs of infection or inflammation.  No tenderness to the abdomen noted to palpation.  Labs were notable for mildly elevated BUN and ketones and protein in urine, suspicious for dehydration.  X-rays were unremarkable.  Reevaluation, patient vital signs been continued to remain stable to course of his time here.  However had been now suffering from a cough.  Lungs still clear to auscultation.  Patient's oxygen saturation also remain normal.  Low suspicion for any emergent pathology present this time.  Consulted with attending who went and saw patient as well and agreed that no other emergent workup or admission was required for this patient at this time.  Recommended that they follow-up with pulmonology and GI for further workup and evaluation.  Spent significant amount of time in the room explaining disease process and necessity for following up in outpatient setting but no need for continued evaluation in the ED or admission to the hospital.  Patient stated they both wanted a gastroenterologist and a pulmonologist to speak with.  Stated that I will provide these for them to call and also to call their PCP to help with possibly scheduling a referral.  Patient  And spouse expressed agreement and understanding of plan.   All questions answered.  Provided strict return to ED precautions.  I believe patient safe to discharge at this time and followed up in outpatient setting.  Differential diagnoses prior to evaluation: The emergent differential diagnosis includes, but is not limited to, displaced PEG tube, pneumonia, URI, chronic aspiration, peritonitis, GERD. This is not an exhaustive differential.   Past Medical History / Co-morbidities / Social History: ALS, mild cognitive impairment, restless leg.  Additional history: Chart reviewed. Pertinent results include:  Last seen by neurology on 04/26/2023, had noted to have PEG tube inserted after hospitalization in January 2025.  Noted to be communicating mainly via iPad at that time.  Was to continue Robinul and donezepil and have PT PT with OT.  Also had nutritional monitoring noted on 04/26/2023.  They also noted that he was at increased risk for respiratory infections due to diminished cough strength from respiratory insufficiency progressive from ALS.  Last seen in the ED on 04/20/2023 after a fall  Lab Tests/Imaging studies: I personally interpreted labs/imaging and the pertinent results include: CBC unremarkable BMP shows elevated BUN likely due to dehydration UA shows ketones and proteins also likely due to dehydration Chest x-ray unremarkable PEG tube in proper position with no evidence of extravasation  I agree with the radiologist interpretation.  Medications: No medications are needed at this time.  I have reviewed the patients home medicines and have made adjustments as needed.    Social Determinants of Health: Patient suffering from ALS and is already undergoing evaluation by a neurologist and having home CoughAssist teams to visit them with next visit scheduled for next week.  Disposition: After consideration of the diagnostic results and the patients response to treatment, I feel that the patient would benefit from the patient bit from  discharge and treatment as above.   emergency department workup does not suggest an emergent condition requiring admission or immediate intervention beyond what has been performed at this time. The plan is: Follow-up with neurology, follow-up  PCP, follow-up with GI and pulmonology, return for any new or worsening symptoms. The patient is safe for discharge and has been instructed to return immediately for worsening symptoms, change in symptoms or any other concerns.   Final Clinical Impression(s) / ED Diagnoses Final diagnoses:  Subacute cough  ALS (amyotrophic lateral sclerosis) [facility])    Rx / DC Orders ED Discharge Orders     None         Lavonia Drafts 05/19/23 2202    Linwood Dibbles, MD 05/22/23 [phone]

## 2023-05-19 NOTE — Telephone Encounter (Signed)
  Received warm transfer from patients wife who called in to community line.  Not established with [facility] provider. Calling for difficulty breathing and possible aspiration. He does have ALS, has home care team. On Saturday home care team had session with cough assist device, he began spontaneously coughing after this treatment and could not stop coughing constantly for several hours. Today he was supposed to have another session but staff cancelled. Wife is worried he is aspirating his tube feeds and inquiring best course of treatment today. She has been holding his feedings today.  Emergency room evaluation advised, wife will coordinate transport, advised to bring some of his formula to hospital with them. Verbalized understanding and in agreement with plan. They will follow up with care team.    Copied from CRM [id number]. Topic: Clinical - Red Word Triage >> May 19, 2023  1:24 PM Alessandra Bevels wrote: Red Word that prompted transfer to Nurse Triage: Patients wife is calling to report that the patient has been coughing for one week since the cough assistant machine was brought in. Patient has ALS. Patient ate one cartoon. Has contacted the ALS assistant. Nurse from [facility]  Chest xray  Unsure if the tube placement is correct since he has a PEG tube. Can hear fluid when he coughs. Like he is aspirating on the fluid in the feed. Reason for Disposition  [1] MODERATE difficulty breathing (e.g., speaks in phrases, SOB even at rest, pulse 100-120) AND [2] still present when not coughing  Protocols used: Cough - Chronic-A-AH

## 2023-05-19 NOTE — ED Triage Notes (Addendum)
 Pt via pov from home with possible aspiration or peg tube displacement (not sure which). Wife states that after he eats he has coughing and moaning. He is using cough assist machine. Advised to have XR to determine if peg tube is in place. Pt alert & unable to speak but is able to communicate and answer some questions with hands, nad noted.

## 2023-05-19 NOTE — ED Notes (Signed)
 UA

## 2023-05-19 NOTE — Discharge Instructions (Addendum)
 Patient was seen today for a cough that has been persistent.  I believe that this is more likely due to the inability to cough up secretions from ALS.  And is requiring further CoughAssist use to help with removal of excretions.  Lungs were clear today and chest x-ray was also reassuring.  All labs today showed that he was possibly suffering from mild dehydration but was otherwise unremarkable for any signs of infection or emergent process requiring further workup at this time.  We do not have reasons to admission to hospital today however if he ever begins to develop any sort of respiratory distress, fever, chest pain, abdominal pain, return to the ED for further evaluation as he may require further care at that time.  I have provided the pulmonology office for you to call and see if you schedule a visit with them as well as the GI doctor you can also call to schedule visit with their office as well.  Recommend you follow-up with PCP for further referrals as they will help expedite your visits if you have a referral.

## 2023-05-22 DIAGNOSIS — G3109 Other frontotemporal dementia: Secondary | ICD-10-CM | POA: Diagnosis not present

## 2023-05-22 DIAGNOSIS — G4761 Periodic limb movement disorder: Secondary | ICD-10-CM | POA: Diagnosis not present

## 2023-05-22 DIAGNOSIS — E46 Unspecified protein-calorie malnutrition: Secondary | ICD-10-CM | POA: Diagnosis not present

## 2023-05-22 DIAGNOSIS — Z431 Encounter for attention to gastrostomy: Secondary | ICD-10-CM | POA: Diagnosis not present

## 2023-05-22 DIAGNOSIS — I10 Essential (primary) hypertension: Secondary | ICD-10-CM | POA: Diagnosis not present

## 2023-05-22 DIAGNOSIS — R4701 Aphasia: Secondary | ICD-10-CM | POA: Diagnosis not present

## 2023-05-22 DIAGNOSIS — R1312 Dysphagia, oropharyngeal phase: Secondary | ICD-10-CM | POA: Diagnosis not present

## 2023-05-22 DIAGNOSIS — G1221 Amyotrophic lateral sclerosis: Secondary | ICD-10-CM | POA: Diagnosis not present

## 2023-05-22 DIAGNOSIS — F02818 Dementia in other diseases classified elsewhere, unspecified severity, with other behavioral disturbance: Secondary | ICD-10-CM | POA: Diagnosis not present

## 2023-05-22 NOTE — Progress Notes (Unsigned)
 OV 05/23/2023  Subjective:  Patient ID: Guy Payne, male , DOB: 1950-06-05 , age 73 y.o. , MRN: [id number] , ADDRESS: [address] [place] [address] PCP Jackelyn Poling, DO Patient Care Team: Jackelyn Poling, DO as PCP - General (Family Medicine)  This Provider for this visit: Treatment Team:  Attending Provider: Kalman Shan, MD    05/23/2023 -   Chief Complaint  Patient presents with   Pulmonary Consult    Cough x 2 wks- not able to produce any sputum. Worse after eating.      HPI Guy Payne 73 y.o. -presents with his wife.  Wife is independent historian because he has ALS.  He is got weak phonic musculature.  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  For the last few months he has had a cough.  In January 2025 underwent PEG tube replacement.  He still able to walk with a walker sometimes even without a walker.  But he has extremely weak voice.  He is able to communicate.  He has a pre-existing diagnosis of frontotemporal dementia but his wife says she is very smart and is able to comprehend passages and instructions so she is surprised about the diagnosis.  For the last few months current issues cough.  For the last 10 days it is worse its constant.  Gets worse when he lies down.  She showed a video of him coughing it was a weak cough.  Few days ago he coughed for 12 hours straight through the course of the night.  They thought the PEG tube was misplaced but they went to the ER couple of days ago and this was cleared.  He had a chest x-ray in the ER that I personally visualized and it is clear.   No h of asthma No history of heart failure but no echo either No history of ACE inhibitor intake He is taking omega-3 fatty acids Denies any acid reflux with there was some burning with PEG tube-she is not on any PPI/H2 blockade  Cough quality is extremely weak and is unable to bring up anything  No fever Non-smoker No sick contacts no  cold   Als has DOE few months   PFT      No data to display         Clinical Data:  Chest pain and short of breath.  Rule out pulmonary  embolism    CT ANGIOGRAPHY CHEST WITH CONTRAST    Technique:  Multidetector CT imaging of the chest was performed  using the standard protocol during bolus administration of  intravenous contrast.  Multiplanar CT image reconstructions  including MIPs were obtained to evaluate the vascular anatomy.    Contrast:  125 ml Omnipaque-300 IV    Comparison:  None    Findings:  Negative for pulmonary embolism.  Negative for aortic  dissection or aneurysm.  Heart size is normal.  Mild coronary  artery calcification and aortic arch calcification.    Negative for infiltrate or effusion.  Partially calcified nodule  the right upper lobe compatible with granuloma.  Negative for  adenopathy or mass.  Lungs are otherwise clear.    Review of the MIP images confirms the above findings.    IMPRESSION:  Negative for pulmonary embolism.    Right upper lobe granuloma.    No acute abnormality.   Provider: Gaynelle Cage     LAB RESULTS last 96 hours DG  Chest Port 1 View Result Date: 05/19/2023 CLINICAL DATA:  Cough, aspiration EXAM: PORTABLE CHEST 1 VIEW COMPARISON:  12/24/2009 FINDINGS: Heart and mediastinal contours are within normal limits. No focal opacities or effusions. No acute bony abnormality. IMPRESSION: No active cardiopulmonary disease. Electronically Signed   By: Charlett Nose M.D.   On: 05/19/2023 18:14   DG ABDOMEN PEG TUBE LOCATION Result Date: 05/19/2023 CLINICAL DATA:  [id number] Aspiration into airway, initial encounter [id number] Dislodged gastrostomy tube [id number] EXAM: ABDOMEN - 1 VIEW COMPARISON:  None available FINDINGS: Gastrostomy tube projects over the left upper quadrant and stomach. Contrast noted within the stomach. No contrast extravasation. No free air or evidence of bowel obstruction. No organomegaly. IMPRESSION:  Gastrostomy tube within the stomach. No evidence of contrast extravasation. Electronically Signed   By: Charlett Nose M.D.   On: 05/19/2023 18:14    IMPRESSION: No sign of penetration or aspiration.   Please refer to the Speech Pathologists report for complete details and recommendations.     Electronically Signed   By: Donzetta Kohut M.D.   On: 12/19/2019 14:23        has a past medical history of ALS (amyotrophic lateral sclerosis) (HCC), Fall (2021), MCI (mild cognitive impairment), Memory loss, Prostate disorder, and Restless leg.   reports that he has never smoked. He has never used smokeless tobacco.  Past Surgical History:  Procedure Laterality Date   genital surgery     was not specific   NO PAST SURGERIES     prostate repair for blockage      No Known Allergies  Immunization History  Administered Date(s) Administered   Janssen (J&J) SARS-COV-2 Vaccination 06/01/2019   Tdap 04/20/2023   Zoster Recombinant(Shingrix) 03/09/2018, 07/23/2018    Family History  Problem Relation Age of Onset   Dementia Neg Hx      Current Outpatient Medications:    donepezil ([name]) 10 MG tablet, TAKE 1 TABLET BY MOUTH EVERYDAY AT BEDTIME, Disp: 90 tablet, Rfl: 3   glycopyrrolate (ROBINUL) 1 MG tablet, Take 1 mg by mouth 3 (three) times daily., Disp: , Rfl:    predniSONE (DELTASONE) 10 MG tablet, Please take prednisone 40 mg x1 day, then 30 mg x1 day, then 20 mg x1 day, then 10 mg x1 day, and then 5 mg x1 day and stop, Disp: 11 tablet, Rfl: 0   Rosuvastatin Calcium 10 MG CPSP, 1 tablet every Mon, Wed, Friday, Disp: , Rfl:    Sulfacetamide Sodium-Sulfur 10-2 % CREA, APPLY TO FACE AT BEDTIME, Disp: , Rfl: 4  Current Facility-Administered Medications:    ipratropium-albuterol (DUONEB) 0.5-2.5 (3) MG/3ML nebulizer solution 3 mL, 3 mL, Nebulization, Q6H PRN,       Objective:   Vitals:   05/23/23 0903 05/23/23 0905  BP: 90/60   Pulse: 68   Temp:  97.9 F (36.6 C)   TempSrc:  Temporal  SpO2: 98%   Weight:  127 lb (57.6 kg)  Height:  5\' 8"  (1.727 m)    Estimated body mass index is 19.31 kg/m as calculated from the following:   Height as of this encounter: 5\' 8"  (1.727 m).   Weight as of this encounter: 127 lb (57.6 kg).  @WEIGHTCHANGE @  Filed Weights   05/23/23 0905  Weight: 127 lb (57.6 kg)     Physical Exam   General: No distress. O2 at rest: no Cane present: no but has walker Sitting in wheel chair: no Frail: yes Obese: no Neuro: Alert and Oriented x 3.  GCS 15.  WEAK VOICE Psych: Pleasant Resp:  Barrel Chest - no.  Wheeze - no, Crackles - no, No overt respiratory distress CVS: Normal heart sounds. Murmurs - no Ext: Stigmata of Connective Tissue Disease - no HEENT: Normal upper airway. PEERL +. No post nasal drip        Assessment:       ICD-10-CM   1. Chronic cough  R05.3 CBC w/Diff    Perennial allergen profile IgE    B Nat Peptide    CT Chest High Resolution    ECHOCARDIOGRAM COMPLETE    D-Dimer, Quantitative    ipratropium-albuterol (DUONEB) 0.5-2.5 (3) MG/3ML nebulizer solution 3 mL    CBC w/Diff    Perennial allergen profile IgE    B Nat Peptide    D-Dimer, Quantitative    Ambulatory Referral for DME    2. DOE (dyspnea on exertion)  R06.09 D-Dimer, Quantitative    ipratropium-albuterol (DUONEB) 0.5-2.5 (3) MG/3ML nebulizer solution 3 mL    D-Dimer, Quantitative    Ambulatory Referral for DME    3. ALS (amyotrophic lateral sclerosis) (HCC)  G12.21 CBC w/Diff    Perennial allergen profile IgE    B Nat Peptide    CT Chest High Resolution    ECHOCARDIOGRAM COMPLETE    D-Dimer, Quantitative    ipratropium-albuterol (DUONEB) 0.5-2.5 (3) MG/3ML nebulizer solution 3 mL    CBC w/Diff    Perennial allergen profile IgE    B Nat Peptide    D-Dimer, Quantitative    Ambulatory Referral for DME    4. Frequent falls  R29.6 CBC w/Diff    Perennial allergen profile IgE    B Nat Peptide    CT Chest High Resolution     ECHOCARDIOGRAM COMPLETE    D-Dimer, Quantitative    ipratropium-albuterol (DUONEB) 0.5-2.5 (3) MG/3ML nebulizer solution 3 mL    CBC w/Diff    Perennial allergen profile IgE    B Nat Peptide    D-Dimer, Quantitative    5. Unsteady gait  R26.81 CBC w/Diff    Perennial allergen profile IgE    B Nat Peptide    CT Chest High Resolution    ECHOCARDIOGRAM COMPLETE    D-Dimer, Quantitative    ipratropium-albuterol (DUONEB) 0.5-2.5 (3) MG/3ML nebulizer solution 3 mL    CBC w/Diff    Perennial allergen profile IgE    B Nat Peptide    D-Dimer, Quantitative    6. Dysphagia, unspecified type  R13.10 CBC w/Diff    Perennial allergen profile IgE    B Nat Peptide    CT Chest High Resolution    ECHOCARDIOGRAM COMPLETE    D-Dimer, Quantitative    ipratropium-albuterol (DUONEB) 0.5-2.5 (3) MG/3ML nebulizer solution 3 mL    CBC w/Diff    Perennial allergen profile IgE    B Nat Peptide    D-Dimer, Quantitative         Plan:     Patient Instructions     ICD-10-CM   1. Chronic cough  R05.3 CBC w/Diff    Perennial allergen profile IgE    B Nat Peptide    CT Chest High Resolution    ECHOCARDIOGRAM COMPLETE    D-Dimer, Quantitative    2. DOE (dyspnea on exertion)  R06.09 D-Dimer, Quantitative    3. ALS (amyotrophic lateral sclerosis) (HCC)  G12.21 CBC w/Diff    Perennial allergen profile IgE    B Nat Peptide    CT Chest High Resolution  ECHOCARDIOGRAM COMPLETE    D-Dimer, Quantitative    4. Frequent falls  R29.6 CBC w/Diff    Perennial allergen profile IgE    B Nat Peptide    CT Chest High Resolution    ECHOCARDIOGRAM COMPLETE    D-Dimer, Quantitative    5. Unsteady gait  R26.81 CBC w/Diff    Perennial allergen profile IgE    B Nat Peptide    CT Chest High Resolution    ECHOCARDIOGRAM COMPLETE    D-Dimer, Quantitative    6. Dysphagia, unspecified type  R13.10 CBC w/Diff    Perennial allergen profile IgE    B Nat Peptide    CT Chest High Resolution     ECHOCARDIOGRAM COMPLETE    D-Dimer, Quantitative       Chronic cough can be for regular reasons such as postviral reactive cough, asthma, heart failure but also can be ALS related with weak musculature  Plan - Do blood work - Do echocardiogram - Do CT scan of the chest - Noted that you cannot do pulmonary function test -Try 5-day course of prednisone [check with neurologist if it is okay to take] - Do bronchodilator DuoNeb 4 times a day as needed for palliative relief of cough -Addendum we will have him stop omega-3 - Addendum: Will have him try some acid reflux medication over-the-counter.   Follow-up - 2 to 4 weeks with nurse practitioner Dr. Marchelle Gearing to review results.     FOLLOWUP Return in about 3 weeks (around 06/13/2023) for with Dr Marchelle Gearing, Face to Face OR Video Visit.    SIGNATURE    Dr. Kalman Shan, M.D., F.C.C.P,  Pulmonary and Critical Care Medicine Staff Physician, [facility] Center Director - Interstitial Lung Disease  Program  Pulmonary Fibrosis [facility] at [facility] [place], [place], [address]  Pager: [phone], If no answer or between  15:00h - 7:00h: call [phone] Telephone: [phone]  9:42 AM 05/23/2023

## 2023-05-23 ENCOUNTER — Other Ambulatory Visit: Payer: Self-pay | Admitting: Internal Medicine

## 2023-05-23 ENCOUNTER — Ambulatory Visit: Admitting: Internal Medicine

## 2023-05-23 ENCOUNTER — Encounter: Payer: Self-pay | Admitting: Internal Medicine

## 2023-05-23 VITALS — BP 90/60 | HR 68 | Temp 97.9°F | Ht 68.0 in | Wt 127.0 lb

## 2023-05-23 DIAGNOSIS — G1221 Amyotrophic lateral sclerosis: Secondary | ICD-10-CM

## 2023-05-23 DIAGNOSIS — I10 Essential (primary) hypertension: Secondary | ICD-10-CM | POA: Diagnosis not present

## 2023-05-23 DIAGNOSIS — F02818 Dementia in other diseases classified elsewhere, unspecified severity, with other behavioral disturbance: Secondary | ICD-10-CM | POA: Diagnosis not present

## 2023-05-23 DIAGNOSIS — E46 Unspecified protein-calorie malnutrition: Secondary | ICD-10-CM | POA: Diagnosis not present

## 2023-05-23 DIAGNOSIS — R0609 Other forms of dyspnea: Secondary | ICD-10-CM

## 2023-05-23 DIAGNOSIS — R1310 Dysphagia, unspecified: Secondary | ICD-10-CM

## 2023-05-23 DIAGNOSIS — R1312 Dysphagia, oropharyngeal phase: Secondary | ICD-10-CM | POA: Diagnosis not present

## 2023-05-23 DIAGNOSIS — R296 Repeated falls: Secondary | ICD-10-CM | POA: Diagnosis not present

## 2023-05-23 DIAGNOSIS — R2681 Unsteadiness on feet: Secondary | ICD-10-CM

## 2023-05-23 DIAGNOSIS — R053 Chronic cough: Secondary | ICD-10-CM

## 2023-05-23 DIAGNOSIS — Z431 Encounter for attention to gastrostomy: Secondary | ICD-10-CM | POA: Diagnosis not present

## 2023-05-23 DIAGNOSIS — G4761 Periodic limb movement disorder: Secondary | ICD-10-CM | POA: Diagnosis not present

## 2023-05-23 DIAGNOSIS — R4701 Aphasia: Secondary | ICD-10-CM | POA: Diagnosis not present

## 2023-05-23 DIAGNOSIS — G3109 Other frontotemporal dementia: Secondary | ICD-10-CM | POA: Diagnosis not present

## 2023-05-23 LAB — CBC WITH DIFFERENTIAL/PLATELET
Basophils Absolute: 0 10*3/uL (ref 0.0–0.1)
Basophils Relative: 0.8 % (ref 0.0–3.0)
Eosinophils Absolute: 0.3 10*3/uL (ref 0.0–0.7)
Eosinophils Relative: 4.1 % (ref 0.0–5.0)
HCT: 43.8 % (ref 39.0–52.0)
Hemoglobin: 14.4 g/dL (ref 13.0–17.0)
Lymphocytes Relative: 9.5 % — ABNORMAL LOW (ref 12.0–46.0)
Lymphs Abs: 0.6 10*3/uL — ABNORMAL LOW (ref 0.7–4.0)
MCHC: 32.8 g/dL (ref 30.0–36.0)
MCV: 96.1 fl (ref 78.0–100.0)
Monocytes Absolute: 0.6 10*3/uL (ref 0.1–1.0)
Monocytes Relative: 9 % (ref 3.0–12.0)
Neutro Abs: 4.8 10*3/uL (ref 1.4–7.7)
Neutrophils Relative %: 76.6 % (ref 43.0–77.0)
Platelets: 262 10*3/uL (ref 150.0–400.0)
RBC: 4.56 Mil/uL (ref 4.22–5.81)
RDW: 13.6 % (ref 11.5–15.5)
WBC: 6.2 10*3/uL (ref 4.0–10.5)

## 2023-05-23 LAB — D-DIMER, QUANTITATIVE: D-Dimer, Quant: 0.76 ug{FEU}/mL — ABNORMAL HIGH (ref <0.50)

## 2023-05-23 LAB — BRAIN NATRIURETIC PEPTIDE: Pro B Natriuretic peptide (BNP): 20 pg/mL (ref 0.0–100.0)

## 2023-05-23 MED ORDER — PREDNISONE 10 MG PO TABS: ORAL_TABLET | ORAL | 0 refills | Status: DC

## 2023-05-23 MED ORDER — IPRATROPIUM-ALBUTEROL 0.5-2.5 (3) MG/3ML IN SOLN: 3.0000 mL | Freq: Four times a day (QID) | RESPIRATORY_TRACT | Status: DC | PRN

## 2023-05-23 MED ORDER — IPRATROPIUM-ALBUTEROL 0.5-2.5 (3) MG/3ML IN SOLN: 3.0000 mL | Freq: Four times a day (QID) | RESPIRATORY_TRACT | 2 refills | Status: DC | PRN

## 2023-05-23 NOTE — Patient Instructions (Addendum)
 ICD-10-CM   1. Chronic cough  R05.3 CBC w/Diff    Perennial allergen profile IgE    B Nat Peptide    CT Chest High Resolution    ECHOCARDIOGRAM COMPLETE    D-Dimer, Quantitative    2. DOE (dyspnea on exertion)  R06.09 D-Dimer, Quantitative    3. ALS (amyotrophic lateral sclerosis) (HCC)  G12.21 CBC w/Diff    Perennial allergen profile IgE    B Nat Peptide    CT Chest High Resolution    ECHOCARDIOGRAM COMPLETE    D-Dimer, Quantitative    4. Frequent falls  R29.6 CBC w/Diff    Perennial allergen profile IgE    B Nat Peptide    CT Chest High Resolution    ECHOCARDIOGRAM COMPLETE    D-Dimer, Quantitative    5. Unsteady gait  R26.81 CBC w/Diff    Perennial allergen profile IgE    B Nat Peptide    CT Chest High Resolution    ECHOCARDIOGRAM COMPLETE    D-Dimer, Quantitative    6. Dysphagia, unspecified type  R13.10 CBC w/Diff    Perennial allergen profile IgE    B Nat Peptide    CT Chest High Resolution    ECHOCARDIOGRAM COMPLETE    D-Dimer, Quantitative       Chronic cough can be for regular reasons such as postviral reactive cough, asthma, heart failure but also can be ALS related with weak musculature  Plan - Do blood work - Do echocardiogram - Do CT scan of the chest - Noted that you cannot do pulmonary function test -Try 5-day course of prednisone [check with neurologist if it is okay to take] - Do bronchodilator DuoNeb 4 times a day as needed for palliative relief of cough -Addendum we will have him stop omega-3 - Addendum: Will have him try some acid reflux medication over-the-counter.   Follow-up - 2 to 4 weeks with nurse practitioner Dr. Marchelle Gearing to review results.

## 2023-05-24 ENCOUNTER — Ambulatory Visit: Payer: Self-pay

## 2023-05-24 ENCOUNTER — Telehealth: Payer: Self-pay | Admitting: Internal Medicine

## 2023-05-24 DIAGNOSIS — R7989 Other specified abnormal findings of blood chemistry: Secondary | ICD-10-CM

## 2023-05-24 DIAGNOSIS — G4761 Periodic limb movement disorder: Secondary | ICD-10-CM | POA: Diagnosis not present

## 2023-05-24 DIAGNOSIS — R0609 Other forms of dyspnea: Secondary | ICD-10-CM

## 2023-05-24 DIAGNOSIS — I10 Essential (primary) hypertension: Secondary | ICD-10-CM | POA: Diagnosis not present

## 2023-05-24 DIAGNOSIS — R1312 Dysphagia, oropharyngeal phase: Secondary | ICD-10-CM | POA: Diagnosis not present

## 2023-05-24 DIAGNOSIS — G1221 Amyotrophic lateral sclerosis: Secondary | ICD-10-CM | POA: Diagnosis not present

## 2023-05-24 DIAGNOSIS — E46 Unspecified protein-calorie malnutrition: Secondary | ICD-10-CM | POA: Diagnosis not present

## 2023-05-24 DIAGNOSIS — F02818 Dementia in other diseases classified elsewhere, unspecified severity, with other behavioral disturbance: Secondary | ICD-10-CM | POA: Diagnosis not present

## 2023-05-24 DIAGNOSIS — G3109 Other frontotemporal dementia: Secondary | ICD-10-CM | POA: Diagnosis not present

## 2023-05-24 DIAGNOSIS — R4701 Aphasia: Secondary | ICD-10-CM | POA: Diagnosis not present

## 2023-05-24 DIAGNOSIS — Z431 Encounter for attention to gastrostomy: Secondary | ICD-10-CM | POA: Diagnosis not present

## 2023-05-24 NOTE — Telephone Encounter (Signed)
 Triager advised E2C2 Agent to transfer to LBPU CAL to prevent confusion. Per chart, Verlon Au CMA spoke to pt wife today regarding results/orders.  Copied from CRM [id number]. Topic: Clinical - Medical Advice >> May 24, 2023  3:19 PM Payton Doughty wrote: Reason for CRM: wife wants to know what is going on w/ the pt.

## 2023-05-24 NOTE — Telephone Encounter (Signed)
 I called and spoke with the pt's spouse, OK per DPR  She verbalized understanding of results  I have placed orders

## 2023-05-24 NOTE — Telephone Encounter (Signed)
 [name] is calling in regards to results. She would like to speak with "Corrie Dandy" but she was called by Verlon Au. Please advise

## 2023-05-24 NOTE — Telephone Encounter (Signed)
  D-dimer elevated.  Need to rule out blood clot although the probability is low  Plan - Do nonurgent VQ scan with chest x-ray.   Latest Reference Range & Units 05/23/23 09:40  D-Dimer, Quant <0.50 mcg/mL FEU 0.76 (H)  (H): Data is abnormally high

## 2023-05-25 DIAGNOSIS — E46 Unspecified protein-calorie malnutrition: Secondary | ICD-10-CM | POA: Diagnosis not present

## 2023-05-25 DIAGNOSIS — G3109 Other frontotemporal dementia: Secondary | ICD-10-CM | POA: Diagnosis not present

## 2023-05-25 DIAGNOSIS — I10 Essential (primary) hypertension: Secondary | ICD-10-CM | POA: Diagnosis not present

## 2023-05-25 DIAGNOSIS — R1312 Dysphagia, oropharyngeal phase: Secondary | ICD-10-CM | POA: Diagnosis not present

## 2023-05-25 DIAGNOSIS — R4701 Aphasia: Secondary | ICD-10-CM | POA: Diagnosis not present

## 2023-05-25 DIAGNOSIS — Z431 Encounter for attention to gastrostomy: Secondary | ICD-10-CM | POA: Diagnosis not present

## 2023-05-25 DIAGNOSIS — G4761 Periodic limb movement disorder: Secondary | ICD-10-CM | POA: Diagnosis not present

## 2023-05-25 DIAGNOSIS — F02818 Dementia in other diseases classified elsewhere, unspecified severity, with other behavioral disturbance: Secondary | ICD-10-CM | POA: Diagnosis not present

## 2023-05-25 DIAGNOSIS — G1221 Amyotrophic lateral sclerosis: Secondary | ICD-10-CM | POA: Diagnosis not present

## 2023-05-25 NOTE — Telephone Encounter (Signed)
 I called and spoke with the pt's spouse and answered her questions regarding tests we have ordered  She is aware will get a phone call to schedule CT and echo  I informed her that [facility] is the DME that we sent neb order to  She verbalized understanding  She is asking if pt would be safe to travel by car to [place] next week  Please advise, thank you

## 2023-05-25 NOTE — Telephone Encounter (Signed)
 Please call patient back patients wife in regards to results.

## 2023-05-25 NOTE — Telephone Encounter (Signed)
 With a high D-dimer travel to car by [place] we will increase the risk for blood clot.  He can go if the VQ scan is completed and the blood clot is ruled out.  Because there is now impending travel please also get a Doppler lower extremity this also needs to be negative before he leaves for [place].  Please order that

## 2023-05-26 ENCOUNTER — Ambulatory Visit (HOSPITAL_COMMUNITY)
Admission: RE | Admit: 2023-05-26 | Discharge: 2023-05-26 | Disposition: A | Discharge status: Home / Self Care | Source: Ambulatory Visit | Attending: Internal Medicine | Admitting: Internal Medicine

## 2023-05-26 DIAGNOSIS — R7989 Other specified abnormal findings of blood chemistry: Secondary | ICD-10-CM | POA: Diagnosis not present

## 2023-05-26 DIAGNOSIS — G3109 Other frontotemporal dementia: Secondary | ICD-10-CM | POA: Diagnosis not present

## 2023-05-26 DIAGNOSIS — F02818 Dementia in other diseases classified elsewhere, unspecified severity, with other behavioral disturbance: Secondary | ICD-10-CM | POA: Diagnosis not present

## 2023-05-26 DIAGNOSIS — E46 Unspecified protein-calorie malnutrition: Secondary | ICD-10-CM | POA: Diagnosis not present

## 2023-05-26 DIAGNOSIS — I10 Essential (primary) hypertension: Secondary | ICD-10-CM | POA: Diagnosis not present

## 2023-05-26 DIAGNOSIS — G1221 Amyotrophic lateral sclerosis: Secondary | ICD-10-CM | POA: Diagnosis not present

## 2023-05-26 DIAGNOSIS — R4701 Aphasia: Secondary | ICD-10-CM | POA: Diagnosis not present

## 2023-05-26 DIAGNOSIS — G4761 Periodic limb movement disorder: Secondary | ICD-10-CM | POA: Diagnosis not present

## 2023-05-26 DIAGNOSIS — R1312 Dysphagia, oropharyngeal phase: Secondary | ICD-10-CM | POA: Diagnosis not present

## 2023-05-26 DIAGNOSIS — Z431 Encounter for attention to gastrostomy: Secondary | ICD-10-CM | POA: Diagnosis not present

## 2023-05-26 NOTE — Telephone Encounter (Signed)
 I called and spoke with the pt's spouse and notified of response per Dr Marchelle Gearing  She verbalized understanding  Dopplers ordered to be done ASAP

## 2023-05-29 ENCOUNTER — Telehealth: Payer: Self-pay | Admitting: Internal Medicine

## 2023-05-29 DIAGNOSIS — E46 Unspecified protein-calorie malnutrition: Secondary | ICD-10-CM | POA: Diagnosis not present

## 2023-05-29 DIAGNOSIS — F02818 Dementia in other diseases classified elsewhere, unspecified severity, with other behavioral disturbance: Secondary | ICD-10-CM | POA: Diagnosis not present

## 2023-05-29 DIAGNOSIS — G4761 Periodic limb movement disorder: Secondary | ICD-10-CM | POA: Diagnosis not present

## 2023-05-29 DIAGNOSIS — J42 Unspecified chronic bronchitis: Secondary | ICD-10-CM

## 2023-05-29 DIAGNOSIS — G1221 Amyotrophic lateral sclerosis: Secondary | ICD-10-CM | POA: Diagnosis not present

## 2023-05-29 DIAGNOSIS — I10 Essential (primary) hypertension: Secondary | ICD-10-CM | POA: Diagnosis not present

## 2023-05-29 DIAGNOSIS — G3109 Other frontotemporal dementia: Secondary | ICD-10-CM | POA: Diagnosis not present

## 2023-05-29 DIAGNOSIS — Z431 Encounter for attention to gastrostomy: Secondary | ICD-10-CM | POA: Diagnosis not present

## 2023-05-29 DIAGNOSIS — R1312 Dysphagia, oropharyngeal phase: Secondary | ICD-10-CM | POA: Diagnosis not present

## 2023-05-29 DIAGNOSIS — R4701 Aphasia: Secondary | ICD-10-CM | POA: Diagnosis not present

## 2023-05-29 NOTE — Telephone Encounter (Signed)
 Spoke with Tammy at [facility]. To get him the nebulizer he would need updated F2F and referral stating a proper diagnoses for getting a nebulizer. She gave examples such as COPD, chronic bronchitis and asthma.   When they have been updated we can send corrected F2F as well as referral to [facility] and they will fill it.  Thanks!

## 2023-05-29 NOTE — Telephone Encounter (Signed)
 Sent another message to Tammy at [facility] today to check on this  Copied from CRM [id number]. Topic: General - Other >> May 25, 2023  8:20 AM Gaetano Hawthorne wrote: Reason for CRM: [name] is inquiring the status of her husband's nebulizer machine - she is unaware of what DME company is being used and has not been contacted. She picked up the solution for it the other day from the pharmacy - Please contact her with details regarding the status of the machine and the order. >> May 25, 2023  3:30 PM Mills Koller wrote: I have spoke with Tammy with [facility] and she is checking on this issue

## 2023-05-29 NOTE — Telephone Encounter (Signed)
 New message from [facility]:   Zott, Lucita Lora, Raven N; Gamble, Tammy; Zott, [place], the order was denied due to the Chronic Cough diagnosis is not a qualifying diagnosis and there is no other qualifying DX noted in his office visit.  Our local office called the patient to offer self pay and they requested we contact the referral to get what we need for insurance to cover the equipment, My local office noted they sent a fax to you on 4/4 requesting and updated DX.  Please advise how we should proceed.  Thank You       Please advise

## 2023-05-30 ENCOUNTER — Telehealth: Payer: Self-pay

## 2023-05-30 DIAGNOSIS — G4761 Periodic limb movement disorder: Secondary | ICD-10-CM | POA: Diagnosis not present

## 2023-05-30 DIAGNOSIS — G3109 Other frontotemporal dementia: Secondary | ICD-10-CM | POA: Diagnosis not present

## 2023-05-30 DIAGNOSIS — R053 Chronic cough: Secondary | ICD-10-CM

## 2023-05-30 DIAGNOSIS — E46 Unspecified protein-calorie malnutrition: Secondary | ICD-10-CM | POA: Diagnosis not present

## 2023-05-30 DIAGNOSIS — R0609 Other forms of dyspnea: Secondary | ICD-10-CM

## 2023-05-30 DIAGNOSIS — R1312 Dysphagia, oropharyngeal phase: Secondary | ICD-10-CM | POA: Diagnosis not present

## 2023-05-30 DIAGNOSIS — R4701 Aphasia: Secondary | ICD-10-CM | POA: Diagnosis not present

## 2023-05-30 DIAGNOSIS — G1221 Amyotrophic lateral sclerosis: Secondary | ICD-10-CM | POA: Diagnosis not present

## 2023-05-30 DIAGNOSIS — Z431 Encounter for attention to gastrostomy: Secondary | ICD-10-CM | POA: Diagnosis not present

## 2023-05-30 DIAGNOSIS — I10 Essential (primary) hypertension: Secondary | ICD-10-CM | POA: Diagnosis not present

## 2023-05-30 DIAGNOSIS — F02818 Dementia in other diseases classified elsewhere, unspecified severity, with other behavioral disturbance: Secondary | ICD-10-CM | POA: Diagnosis not present

## 2023-05-30 NOTE — Telephone Encounter (Signed)
 I spoke with Tammy with [facility] and if you look at Guy Payne's message from earlier. Insurance will not pay for [employer] with Dx of chronic cough. The Dx code will need to be changed unless the patient wants to pay out of pocket. There is no mention of Wheezing which would be approved

## 2023-05-30 NOTE — Telephone Encounter (Signed)
 I called and spoke with the pt's spouse and explained neb machine is not covered  I advised that she can buy a machine and supplies from [facility] if they want  Will discuss at visit in May  Nothing further needed    PCC's- the order can be cancelled

## 2023-05-30 NOTE — Telephone Encounter (Signed)
 Copied from CRM [id number]. Topic: General - Other >> May 25, 2023  8:20 AM Gaetano Hawthorne wrote: Reason for CRM: [name] is inquiring the status of her husband's nebulizer machine - she is unaware of what DME company is being used and has not been contacted. She picked up the solution for it the other day from the pharmacy - Please contact her with details regarding the status of the machine and the order. >> May 30, 2023  9:09 AM Nila Nephew wrote: Home Health Nurse is calling to request that the nebulizer be sent to [facility] instead. Wife is also calling to request that this be handled ASAP, as they are leaving Friday and want it before then.  >> May 25, 2023  3:30 PM Mills Koller wrote: I have spoke with Tammy with [facility] and she is checking on this issue  I called and spoke to pt's Wife (DPR) Neb order sent to [facility]. Wife verbalized understanding. NFN

## 2023-05-30 NOTE — Telephone Encounter (Deleted)
 Copied from CRM [id number]. Topic: General - Other >> May 25, 2023  8:20 AM Gaetano Hawthorne wrote: Reason for CRM: [name] is inquiring the status of her husband's nebulizer machine - she is unaware of what DME company is being used and has not been contacted. She picked up the solution for it the other day from the pharmacy - Please contact her with details regarding the status of the machine and the order. >> May 30, 2023  9:09 AM Nila Nephew wrote: Home Health Nurse is calling to request that the nebulizer be sent to [facility] instead. Wife is also calling to request that this be handled ASAP, as they are leaving Friday and want it before then.  >> May 25, 2023  3:30 PM Mills Koller wrote: I have spoke with Tammy with [facility] and she is checking on this issue

## 2023-05-30 NOTE — Telephone Encounter (Signed)
 Copied from CRM [id number]. Topic: General - Other >> May 25, 2023  8:20 AM Gaetano Hawthorne wrote: Reason for CRM: [name] is inquiring the status of her husband's nebulizer machine - she is unaware of what DME company is being used and has not been contacted. She picked up the solution for it the other day from the pharmacy - Please contact her with details regarding the status of the machine and the order. >> May 30, 2023  9:09 AM Nila Nephew wrote: Home Health Nurse is calling to request that the nebulizer be sent to [facility] instead. Wife is also calling to request that this be handled ASAP, as they are leaving Friday and want it before then.  >> May 25, 2023  3:30 PM Mills Koller wrote: I have spoke with Tammy with [facility] and she is checking on this issue  ATC x1 LVM for patient to call our office back

## 2023-05-31 ENCOUNTER — Encounter (HOSPITAL_BASED_OUTPATIENT_CLINIC_OR_DEPARTMENT_OTHER): Payer: Self-pay

## 2023-05-31 ENCOUNTER — Telehealth: Payer: Self-pay | Admitting: Internal Medicine

## 2023-05-31 ENCOUNTER — Ambulatory Visit (HOSPITAL_BASED_OUTPATIENT_CLINIC_OR_DEPARTMENT_OTHER)
Admission: RE | Admit: 2023-05-31 | Discharge: 2023-05-31 | Disposition: A | Discharge status: Home / Self Care | Source: Ambulatory Visit | Attending: Internal Medicine | Admitting: Internal Medicine

## 2023-05-31 DIAGNOSIS — G3109 Other frontotemporal dementia: Secondary | ICD-10-CM | POA: Diagnosis not present

## 2023-05-31 DIAGNOSIS — R053 Chronic cough: Secondary | ICD-10-CM | POA: Insufficient documentation

## 2023-05-31 DIAGNOSIS — R1902 Left upper quadrant abdominal swelling, mass and lump: Secondary | ICD-10-CM | POA: Diagnosis not present

## 2023-05-31 DIAGNOSIS — Z431 Encounter for attention to gastrostomy: Secondary | ICD-10-CM | POA: Diagnosis not present

## 2023-05-31 DIAGNOSIS — R296 Repeated falls: Secondary | ICD-10-CM | POA: Diagnosis not present

## 2023-05-31 DIAGNOSIS — R2681 Unsteadiness on feet: Secondary | ICD-10-CM | POA: Insufficient documentation

## 2023-05-31 DIAGNOSIS — G1221 Amyotrophic lateral sclerosis: Secondary | ICD-10-CM | POA: Diagnosis not present

## 2023-05-31 DIAGNOSIS — I700 Atherosclerosis of aorta: Secondary | ICD-10-CM | POA: Diagnosis not present

## 2023-05-31 DIAGNOSIS — E46 Unspecified protein-calorie malnutrition: Secondary | ICD-10-CM | POA: Diagnosis not present

## 2023-05-31 DIAGNOSIS — R1310 Dysphagia, unspecified: Secondary | ICD-10-CM | POA: Insufficient documentation

## 2023-05-31 DIAGNOSIS — R4701 Aphasia: Secondary | ICD-10-CM | POA: Diagnosis not present

## 2023-05-31 DIAGNOSIS — F02818 Dementia in other diseases classified elsewhere, unspecified severity, with other behavioral disturbance: Secondary | ICD-10-CM | POA: Diagnosis not present

## 2023-05-31 DIAGNOSIS — G4761 Periodic limb movement disorder: Secondary | ICD-10-CM | POA: Diagnosis not present

## 2023-05-31 DIAGNOSIS — I10 Essential (primary) hypertension: Secondary | ICD-10-CM | POA: Diagnosis not present

## 2023-05-31 DIAGNOSIS — R1312 Dysphagia, oropharyngeal phase: Secondary | ICD-10-CM | POA: Diagnosis not present

## 2023-05-31 NOTE — Telephone Encounter (Signed)
 Tammy with [facility] wanted me to make sure you were aware of this issue

## 2023-05-31 NOTE — Telephone Encounter (Signed)
 done

## 2023-05-31 NOTE — Telephone Encounter (Signed)
 MR, please advise if there is another dx that can be used for nebulizer machine. Insurance will not cover it for chronic cough.

## 2023-05-31 NOTE — Telephone Encounter (Signed)
 Hebert Soho; Grampian, Jobe Igo; Kathe Becton; Randie Heinz, [name] still needs signed       Hey Dr. Marchelle Gearing, when you get a chance today, could you please sign the order?Thanks.

## 2023-05-31 NOTE — Telephone Encounter (Signed)
 Try chronic bronchitis

## 2023-06-01 ENCOUNTER — Encounter (HOSPITAL_COMMUNITY)
Admission: RE | Admit: 2023-06-01 | Discharge: 2023-06-01 | Disposition: A | Discharge status: Home / Self Care | Source: Ambulatory Visit | Attending: Internal Medicine | Admitting: Internal Medicine

## 2023-06-01 ENCOUNTER — Ambulatory Visit (HOSPITAL_COMMUNITY)
Admission: RE | Admit: 2023-06-01 | Discharge: 2023-06-01 | Disposition: A | Discharge status: Home / Self Care | Source: Ambulatory Visit | Attending: Internal Medicine | Admitting: Internal Medicine

## 2023-06-01 DIAGNOSIS — R0609 Other forms of dyspnea: Secondary | ICD-10-CM | POA: Insufficient documentation

## 2023-06-01 DIAGNOSIS — R7989 Other specified abnormal findings of blood chemistry: Secondary | ICD-10-CM

## 2023-06-01 DIAGNOSIS — J439 Emphysema, unspecified: Secondary | ICD-10-CM | POA: Diagnosis not present

## 2023-06-01 LAB — ALLERGEN PROFILE, PERENNIAL ALLERGEN IGE

## 2023-06-01 MED ORDER — TECHNETIUM TO 99M ALBUMIN AGGREGATED
4.4000 | Freq: Once | INTRAVENOUS | Status: AC
  Administered 2023-06-01: 4.4 via INTRAVENOUS

## 2023-06-01 NOTE — Addendum Note (Signed)
 Addended byFloydene Flock, Shalen Petrak A on: 06/01/2023 11:02 AM   Modules accepted: Orders

## 2023-06-01 NOTE — Telephone Encounter (Signed)
 Virgel Bouquet I have placed new neb order using dx code from MR.

## 2023-06-02 ENCOUNTER — Encounter: Payer: Self-pay | Admitting: Internal Medicine

## 2023-06-02 NOTE — Progress Notes (Signed)
NO PE.

## 2023-06-11 DIAGNOSIS — G1221 Amyotrophic lateral sclerosis: Secondary | ICD-10-CM | POA: Diagnosis not present

## 2023-06-12 ENCOUNTER — Encounter: Payer: Self-pay | Admitting: Internal Medicine

## 2023-06-12 NOTE — Progress Notes (Signed)
 Normal blood allergy test

## 2023-06-13 DIAGNOSIS — R4701 Aphasia: Secondary | ICD-10-CM | POA: Diagnosis not present

## 2023-06-13 DIAGNOSIS — I10 Essential (primary) hypertension: Secondary | ICD-10-CM | POA: Diagnosis not present

## 2023-06-13 DIAGNOSIS — E46 Unspecified protein-calorie malnutrition: Secondary | ICD-10-CM | POA: Diagnosis not present

## 2023-06-13 DIAGNOSIS — Z431 Encounter for attention to gastrostomy: Secondary | ICD-10-CM | POA: Diagnosis not present

## 2023-06-13 DIAGNOSIS — R1312 Dysphagia, oropharyngeal phase: Secondary | ICD-10-CM | POA: Diagnosis not present

## 2023-06-13 DIAGNOSIS — G4761 Periodic limb movement disorder: Secondary | ICD-10-CM | POA: Diagnosis not present

## 2023-06-13 DIAGNOSIS — G1221 Amyotrophic lateral sclerosis: Secondary | ICD-10-CM | POA: Diagnosis not present

## 2023-06-13 DIAGNOSIS — F02818 Dementia in other diseases classified elsewhere, unspecified severity, with other behavioral disturbance: Secondary | ICD-10-CM | POA: Diagnosis not present

## 2023-06-13 DIAGNOSIS — G3109 Other frontotemporal dementia: Secondary | ICD-10-CM | POA: Diagnosis not present

## 2023-06-14 DIAGNOSIS — G1221 Amyotrophic lateral sclerosis: Secondary | ICD-10-CM | POA: Diagnosis not present

## 2023-06-14 DIAGNOSIS — F02818 Dementia in other diseases classified elsewhere, unspecified severity, with other behavioral disturbance: Secondary | ICD-10-CM | POA: Diagnosis not present

## 2023-06-14 DIAGNOSIS — R1312 Dysphagia, oropharyngeal phase: Secondary | ICD-10-CM | POA: Diagnosis not present

## 2023-06-14 DIAGNOSIS — R4701 Aphasia: Secondary | ICD-10-CM | POA: Diagnosis not present

## 2023-06-14 DIAGNOSIS — I10 Essential (primary) hypertension: Secondary | ICD-10-CM | POA: Diagnosis not present

## 2023-06-14 DIAGNOSIS — G4761 Periodic limb movement disorder: Secondary | ICD-10-CM | POA: Diagnosis not present

## 2023-06-14 DIAGNOSIS — Z431 Encounter for attention to gastrostomy: Secondary | ICD-10-CM | POA: Diagnosis not present

## 2023-06-14 DIAGNOSIS — E46 Unspecified protein-calorie malnutrition: Secondary | ICD-10-CM | POA: Diagnosis not present

## 2023-06-14 DIAGNOSIS — G3109 Other frontotemporal dementia: Secondary | ICD-10-CM | POA: Diagnosis not present

## 2023-06-16 DIAGNOSIS — G3109 Other frontotemporal dementia: Secondary | ICD-10-CM | POA: Diagnosis not present

## 2023-06-16 DIAGNOSIS — Z125 Encounter for screening for malignant neoplasm of prostate: Secondary | ICD-10-CM | POA: Diagnosis not present

## 2023-06-16 DIAGNOSIS — F02818 Dementia in other diseases classified elsewhere, unspecified severity, with other behavioral disturbance: Secondary | ICD-10-CM | POA: Diagnosis not present

## 2023-06-16 DIAGNOSIS — R4701 Aphasia: Secondary | ICD-10-CM | POA: Diagnosis not present

## 2023-06-16 DIAGNOSIS — R053 Chronic cough: Secondary | ICD-10-CM | POA: Diagnosis not present

## 2023-06-16 DIAGNOSIS — Z23 Encounter for immunization: Secondary | ICD-10-CM | POA: Diagnosis not present

## 2023-06-16 DIAGNOSIS — I10 Essential (primary) hypertension: Secondary | ICD-10-CM | POA: Diagnosis not present

## 2023-06-16 DIAGNOSIS — Z0000 Encounter for general adult medical examination without abnormal findings: Secondary | ICD-10-CM | POA: Diagnosis not present

## 2023-06-16 DIAGNOSIS — G1221 Amyotrophic lateral sclerosis: Secondary | ICD-10-CM | POA: Diagnosis not present

## 2023-06-16 DIAGNOSIS — R1312 Dysphagia, oropharyngeal phase: Secondary | ICD-10-CM | POA: Diagnosis not present

## 2023-06-16 DIAGNOSIS — Z431 Encounter for attention to gastrostomy: Secondary | ICD-10-CM | POA: Diagnosis not present

## 2023-06-16 DIAGNOSIS — E46 Unspecified protein-calorie malnutrition: Secondary | ICD-10-CM | POA: Diagnosis not present

## 2023-06-16 DIAGNOSIS — G4761 Periodic limb movement disorder: Secondary | ICD-10-CM | POA: Diagnosis not present

## 2023-06-16 DIAGNOSIS — E782 Mixed hyperlipidemia: Secondary | ICD-10-CM | POA: Diagnosis not present

## 2023-06-16 DIAGNOSIS — L819 Disorder of pigmentation, unspecified: Secondary | ICD-10-CM | POA: Diagnosis not present

## 2023-06-19 DIAGNOSIS — E46 Unspecified protein-calorie malnutrition: Secondary | ICD-10-CM | POA: Diagnosis not present

## 2023-06-19 DIAGNOSIS — G1221 Amyotrophic lateral sclerosis: Secondary | ICD-10-CM | POA: Diagnosis not present

## 2023-06-19 DIAGNOSIS — G3109 Other frontotemporal dementia: Secondary | ICD-10-CM | POA: Diagnosis not present

## 2023-06-19 DIAGNOSIS — G4761 Periodic limb movement disorder: Secondary | ICD-10-CM | POA: Diagnosis not present

## 2023-06-19 DIAGNOSIS — F02818 Dementia in other diseases classified elsewhere, unspecified severity, with other behavioral disturbance: Secondary | ICD-10-CM | POA: Diagnosis not present

## 2023-06-19 DIAGNOSIS — Z431 Encounter for attention to gastrostomy: Secondary | ICD-10-CM | POA: Diagnosis not present

## 2023-06-19 DIAGNOSIS — R4701 Aphasia: Secondary | ICD-10-CM | POA: Diagnosis not present

## 2023-06-19 DIAGNOSIS — I10 Essential (primary) hypertension: Secondary | ICD-10-CM | POA: Diagnosis not present

## 2023-06-19 DIAGNOSIS — R1312 Dysphagia, oropharyngeal phase: Secondary | ICD-10-CM | POA: Diagnosis not present

## 2023-06-20 ENCOUNTER — Other Ambulatory Visit: Payer: Self-pay

## 2023-06-20 ENCOUNTER — Inpatient Hospital Stay (HOSPITAL_COMMUNITY)
Admission: EM | Admit: 2023-06-20 | Discharge: 2023-06-27 | DRG: 871 | Disposition: A | Discharge status: Home Health | Attending: Internal Medicine | Admitting: Internal Medicine

## 2023-06-20 ENCOUNTER — Telehealth: Payer: Self-pay

## 2023-06-20 ENCOUNTER — Ambulatory Visit (HOSPITAL_BASED_OUTPATIENT_CLINIC_OR_DEPARTMENT_OTHER)

## 2023-06-20 ENCOUNTER — Emergency Department (HOSPITAL_COMMUNITY)

## 2023-06-20 ENCOUNTER — Encounter (HOSPITAL_COMMUNITY): Payer: Self-pay

## 2023-06-20 DIAGNOSIS — B9789 Other viral agents as the cause of diseases classified elsewhere: Secondary | ICD-10-CM | POA: Diagnosis present

## 2023-06-20 DIAGNOSIS — Z9181 History of falling: Secondary | ICD-10-CM | POA: Diagnosis not present

## 2023-06-20 DIAGNOSIS — J189 Pneumonia, unspecified organism: Principal | ICD-10-CM | POA: Diagnosis present

## 2023-06-20 DIAGNOSIS — N281 Cyst of kidney, acquired: Secondary | ICD-10-CM | POA: Diagnosis present

## 2023-06-20 DIAGNOSIS — I10 Essential (primary) hypertension: Secondary | ICD-10-CM | POA: Diagnosis not present

## 2023-06-20 DIAGNOSIS — J918 Pleural effusion in other conditions classified elsewhere: Secondary | ICD-10-CM | POA: Diagnosis present

## 2023-06-20 DIAGNOSIS — Z681 Body mass index (BMI) 19 or less, adult: Secondary | ICD-10-CM

## 2023-06-20 DIAGNOSIS — R053 Chronic cough: Secondary | ICD-10-CM | POA: Diagnosis not present

## 2023-06-20 DIAGNOSIS — K148 Other diseases of tongue: Secondary | ICD-10-CM | POA: Diagnosis present

## 2023-06-20 DIAGNOSIS — R6520 Severe sepsis without septic shock: Secondary | ICD-10-CM | POA: Diagnosis present

## 2023-06-20 DIAGNOSIS — R509 Fever, unspecified: Secondary | ICD-10-CM | POA: Diagnosis not present

## 2023-06-20 DIAGNOSIS — Z79899 Other long term (current) drug therapy: Secondary | ICD-10-CM | POA: Diagnosis not present

## 2023-06-20 DIAGNOSIS — R0902 Hypoxemia: Secondary | ICD-10-CM | POA: Diagnosis not present

## 2023-06-20 DIAGNOSIS — G2581 Restless legs syndrome: Secondary | ICD-10-CM | POA: Diagnosis present

## 2023-06-20 DIAGNOSIS — J984 Other disorders of lung: Secondary | ICD-10-CM | POA: Diagnosis not present

## 2023-06-20 DIAGNOSIS — Z931 Gastrostomy status: Secondary | ICD-10-CM

## 2023-06-20 DIAGNOSIS — G1221 Amyotrophic lateral sclerosis: Secondary | ICD-10-CM

## 2023-06-20 DIAGNOSIS — Z1152 Encounter for screening for COVID-19: Secondary | ICD-10-CM | POA: Diagnosis not present

## 2023-06-20 DIAGNOSIS — E876 Hypokalemia: Secondary | ICD-10-CM | POA: Diagnosis present

## 2023-06-20 DIAGNOSIS — R2681 Unsteadiness on feet: Secondary | ICD-10-CM

## 2023-06-20 DIAGNOSIS — A4189 Other specified sepsis: Secondary | ICD-10-CM | POA: Diagnosis not present

## 2023-06-20 DIAGNOSIS — R296 Repeated falls: Secondary | ICD-10-CM

## 2023-06-20 DIAGNOSIS — R1310 Dysphagia, unspecified: Secondary | ICD-10-CM | POA: Diagnosis not present

## 2023-06-20 DIAGNOSIS — E43 Unspecified severe protein-calorie malnutrition: Secondary | ICD-10-CM | POA: Diagnosis present

## 2023-06-20 DIAGNOSIS — A403 Sepsis due to Streptococcus pneumoniae: Secondary | ICD-10-CM | POA: Diagnosis not present

## 2023-06-20 DIAGNOSIS — Z22322 Carrier or suspected carrier of Methicillin resistant Staphylococcus aureus: Secondary | ICD-10-CM | POA: Diagnosis not present

## 2023-06-20 DIAGNOSIS — A419 Sepsis, unspecified organism: Secondary | ICD-10-CM

## 2023-06-20 DIAGNOSIS — J90 Pleural effusion, not elsewhere classified: Secondary | ICD-10-CM | POA: Diagnosis not present

## 2023-06-20 DIAGNOSIS — R051 Acute cough: Secondary | ICD-10-CM | POA: Diagnosis not present

## 2023-06-20 DIAGNOSIS — B9781 Human metapneumovirus as the cause of diseases classified elsewhere: Secondary | ICD-10-CM | POA: Diagnosis present

## 2023-06-20 DIAGNOSIS — R918 Other nonspecific abnormal finding of lung field: Secondary | ICD-10-CM | POA: Diagnosis not present

## 2023-06-20 DIAGNOSIS — E872 Acidosis, unspecified: Secondary | ICD-10-CM | POA: Diagnosis not present

## 2023-06-20 DIAGNOSIS — J9601 Acute respiratory failure with hypoxia: Secondary | ICD-10-CM | POA: Diagnosis not present

## 2023-06-20 DIAGNOSIS — G3184 Mild cognitive impairment, so stated: Secondary | ICD-10-CM | POA: Diagnosis present

## 2023-06-20 DIAGNOSIS — B348 Other viral infections of unspecified site: Secondary | ICD-10-CM | POA: Diagnosis not present

## 2023-06-20 DIAGNOSIS — R0682 Tachypnea, not elsewhere classified: Secondary | ICD-10-CM | POA: Diagnosis not present

## 2023-06-20 LAB — COMPREHENSIVE METABOLIC PANEL WITH GFR
ALT: 25 U/L (ref 0–44)
AST: 25 U/L (ref 15–41)
Albumin: 3.4 g/dL — ABNORMAL LOW (ref 3.5–5.0)
Alkaline Phosphatase: 67 U/L (ref 38–126)
Anion gap: 11 (ref 5–15)
BUN: 33 mg/dL — ABNORMAL HIGH (ref 8–23)
CO2: 24 mmol/L (ref 22–32)
Calcium: 8.9 mg/dL (ref 8.9–10.3)
Chloride: 104 mmol/L (ref 98–111)
Creatinine, Ser: 0.76 mg/dL (ref 0.61–1.24)
GFR, Estimated: >60 mL/min (ref >60)
Glucose, Bld: 183 mg/dL — ABNORMAL HIGH (ref 70–99)
Potassium: 4.5 mmol/L (ref 3.5–5.1)
Sodium: 139 mmol/L (ref 135–145)
Total Bilirubin: 0.8 mg/dL (ref 0.0–1.2)
Total Protein: 7.5 g/dL (ref 6.5–8.1)

## 2023-06-20 LAB — ECHOCARDIOGRAM COMPLETE: S' Lateral: 2.52 cm

## 2023-06-20 LAB — CBC WITH DIFFERENTIAL/PLATELET
Abs Immature Granulocytes: 0.05 10*3/uL (ref 0.00–0.07)
Basophils Absolute: 0 10*3/uL (ref 0.0–0.1)
Basophils Relative: 0 %
Eosinophils Absolute: 0 10*3/uL (ref 0.0–0.5)
Eosinophils Relative: 0 %
HCT: 43.7 % (ref 39.0–52.0)
Hemoglobin: 14.2 g/dL (ref 13.0–17.0)
Immature Granulocytes: 0 %
Lymphocytes Relative: 1 %
Lymphs Abs: 0.2 10*3/uL — ABNORMAL LOW (ref 0.7–4.0)
MCH: 31.3 pg (ref 26.0–34.0)
MCHC: 32.5 g/dL (ref 30.0–36.0)
MCV: 96.3 fL (ref 80.0–100.0)
Monocytes Absolute: 1 10*3/uL (ref 0.1–1.0)
Monocytes Relative: 8 %
Neutro Abs: 11.5 10*3/uL — ABNORMAL HIGH (ref 1.7–7.7)
Neutrophils Relative %: 91 %
Platelets: 227 10*3/uL (ref 150–400)
RBC: 4.54 MIL/uL (ref 4.22–5.81)
RDW: 13 % (ref 11.5–15.5)
WBC: 12.7 10*3/uL — ABNORMAL HIGH (ref 4.0–10.5)
nRBC: 0 % (ref 0.0–0.2)

## 2023-06-20 LAB — RESP PANEL BY RT-PCR (RSV, FLU A&B, COVID)  RVPGX2
Influenza A by PCR: NEGATIVE
Influenza B by PCR: NEGATIVE
Resp Syncytial Virus by PCR: NEGATIVE
SARS Coronavirus 2 by RT PCR: NEGATIVE

## 2023-06-20 LAB — PROTIME-INR
INR: 1.2 (ref 0.8–1.2)
Prothrombin Time: 15.5 s — ABNORMAL HIGH (ref 11.4–15.2)

## 2023-06-20 LAB — I-STAT CG4 LACTIC ACID, ED
Lactic Acid, Venous: 2.9 mmol/L (ref 0.5–1.9)
Lactic Acid, Venous: 2 mmol/L (ref 0.5–1.9)

## 2023-06-20 LAB — BLOOD GAS, VENOUS
Acid-Base Excess: 4.9 mmol/L — ABNORMAL HIGH (ref 0.0–2.0)
Bicarbonate: 28.1 mmol/L — ABNORMAL HIGH (ref 20.0–28.0)
O2 Saturation: 93.8 %
Patient temperature: 37
pCO2, Ven: 36 mmHg — ABNORMAL LOW (ref 44–60)
pH, Ven: 7.5 — ABNORMAL HIGH (ref 7.25–7.43)
pO2, Ven: 59 mmHg — ABNORMAL HIGH (ref 32–45)

## 2023-06-20 LAB — GLUCOSE, CAPILLARY: Glucose-Capillary: 182 mg/dL — ABNORMAL HIGH (ref 70–99)

## 2023-06-20 MED ORDER — IPRATROPIUM-ALBUTEROL 0.5-2.5 (3) MG/3ML IN SOLN
3.0000 mL | RESPIRATORY_TRACT | Status: DC
  Administered 2023-06-20 – 2023-06-21 (×6): 3 mL via RESPIRATORY_TRACT
  Filled 2023-06-20 (×6): qty 3

## 2023-06-20 MED ORDER — VANCOMYCIN HCL IN DEXTROSE 1-5 GM/200ML-% IV SOLN
1000.0000 mg | Freq: Once | INTRAVENOUS | Status: AC
  Administered 2023-06-20: 1000 mg via INTRAVENOUS
  Filled 2023-06-20: qty 200

## 2023-06-20 MED ORDER — GLYCOPYRROLATE 1 MG PO TABS
1.0000 mg | ORAL_TABLET | Freq: Three times a day (TID) | ORAL | Status: DC
  Filled 2023-06-20 (×2): qty 1

## 2023-06-20 MED ORDER — ACETAMINOPHEN 650 MG RE SUPP
650.0000 mg | Freq: Four times a day (QID) | RECTAL | Status: DC | PRN
  Administered 2023-06-21: 650 mg via RECTAL
  Filled 2023-06-20: qty 1

## 2023-06-20 MED ORDER — SODIUM CHLORIDE 0.9 % IV SOLN
3.0000 g | Freq: Four times a day (QID) | INTRAVENOUS | Status: AC
Start: 2023-06-21 — End: 2023-06-26
  Administered 2023-06-21 – 2023-06-26 (×22): 3 g via INTRAVENOUS
  Filled 2023-06-20 (×23): qty 8

## 2023-06-20 MED ORDER — ACETAMINOPHEN 160 MG/5ML PO SOLN
650.0000 mg | Freq: Once | ORAL | Status: AC
  Administered 2023-06-20: 650 mg
  Filled 2023-06-20: qty 20.3

## 2023-06-20 MED ORDER — VITAL HIGH PROTEIN PO LIQD
1000.0000 mL | ORAL | Status: DC
Start: 2023-06-20 — End: 2023-06-20
  Filled 2023-06-20: qty 1000

## 2023-06-20 MED ORDER — JEVITY 1.5 CAL/FIBER PO LIQD
1000.0000 mL | ORAL | Status: DC
Start: 2023-06-20 — End: 2023-06-21
  Administered 2023-06-20: 1000 mL
  Filled 2023-06-20: qty 1000

## 2023-06-20 MED ORDER — LACTATED RINGERS IV BOLUS (SEPSIS)
500.0000 mL | Freq: Once | INTRAVENOUS | Status: AC
  Administered 2023-06-20: 500 mL via INTRAVENOUS

## 2023-06-20 MED ORDER — LACTATED RINGERS IV BOLUS (SEPSIS)
1000.0000 mL | Freq: Once | INTRAVENOUS | Status: AC
  Administered 2023-06-20: 1000 mL via INTRAVENOUS

## 2023-06-20 MED ORDER — SODIUM CHLORIDE 0.9 % IV SOLN
2.0000 g | Freq: Once | INTRAVENOUS | Status: AC
  Administered 2023-06-20: 2 g via INTRAVENOUS
  Filled 2023-06-20: qty 12.5

## 2023-06-20 MED ORDER — DEXTROSE IN LACTATED RINGERS 5 % IV SOLN: INTRAVENOUS | Status: AC

## 2023-06-20 MED ORDER — ACETAMINOPHEN 325 MG PO TABS
650.0000 mg | ORAL_TABLET | Freq: Once | ORAL | Status: DC
  Filled 2023-06-20: qty 2

## 2023-06-20 MED ORDER — SODIUM CHLORIDE 0.9 % IV SOLN
500.0000 mg | INTRAVENOUS | Status: AC
  Administered 2023-06-20 – 2023-06-22 (×3): 500 mg via INTRAVENOUS
  Filled 2023-06-20 (×3): qty 5

## 2023-06-20 MED ORDER — LACTATED RINGERS IV BOLUS (SEPSIS)
250.0000 mL | Freq: Once | INTRAVENOUS | Status: AC
  Administered 2023-06-20: 250 mL via INTRAVENOUS

## 2023-06-20 MED ORDER — POLYETHYLENE GLYCOL 3350 17 G PO PACK: 17.0000 g | PACK | Freq: Every day | ORAL | Status: DC | PRN

## 2023-06-20 MED ORDER — ENOXAPARIN SODIUM 40 MG/0.4ML IJ SOSY
40.0000 mg | PREFILLED_SYRINGE | INTRAMUSCULAR | Status: DC
  Administered 2023-06-21 – 2023-06-27 (×7): 40 mg via SUBCUTANEOUS
  Filled 2023-06-20 (×7): qty 0.4

## 2023-06-20 MED ORDER — LACTATED RINGERS IV SOLN: INTRAVENOUS | Status: DC

## 2023-06-20 MED ORDER — ACETYLCYSTEINE 20 % IN SOLN
4.0000 mL | Freq: Three times a day (TID) | RESPIRATORY_TRACT | Status: DC
  Administered 2023-06-21 (×2): 4 mL via RESPIRATORY_TRACT
  Filled 2023-06-20 (×6): qty 4

## 2023-06-20 MED ORDER — METRONIDAZOLE 500 MG/100ML IV SOLN
500.0000 mg | Freq: Once | INTRAVENOUS | Status: AC
  Administered 2023-06-20: 500 mg via INTRAVENOUS
  Filled 2023-06-20: qty 100

## 2023-06-20 MED ORDER — ACETAMINOPHEN 325 MG PO TABS: 650.0000 mg | ORAL_TABLET | Freq: Four times a day (QID) | ORAL | Status: DC | PRN

## 2023-06-20 MED ORDER — SODIUM CHLORIDE 0.9% FLUSH
3.0000 mL | Freq: Two times a day (BID) | INTRAVENOUS | Status: DC
Start: 2023-06-20 — End: 2023-06-27
  Administered 2023-06-20 – 2023-06-27 (×10): 3 mL via INTRAVENOUS

## 2023-06-20 NOTE — ED Notes (Signed)
 Admitting Provider at bedside.

## 2023-06-20 NOTE — ED Triage Notes (Addendum)
 Patient presented to ER for hypoxia/tachypnea at his PCP office (SpO2 84 on RA). Patient has history of ALS. Purulence noted around feeding tube. Patient has been having fevers and cough, was on a steroid taper, wife said noticed no difference when on steroids. Patient is nonverbal, wife at bedside.PCP wanted feeding tube placement checked.

## 2023-06-20 NOTE — Assessment & Plan Note (Signed)
 Incidental. See CT above, outpatient eval.

## 2023-06-20 NOTE — Telephone Encounter (Signed)
  Lung flails are clear.  If there is concern about cough it is definitely not coming from parenchymal lung disease.  I recall he had a weak cough and I think it might be coming from the ALS.  It is also possible that the tube was dislodged as the rightfully suspect.  They have to call the physician managing the tube   Narrative & Impression  CLINICAL DATA:  Chronic persisting cough.   EXAM: CT CHEST WITHOUT CONTRAST   TECHNIQUE: Multidetector CT imaging of the chest was performed following the standard protocol without intravenous contrast. High resolution imaging of the lungs, as well as inspiratory and expiratory imaging, was performed.   RADIATION DOSE REDUCTION: This exam was performed according to the departmental dose-optimization program which includes automated exposure control, adjustment of the mA and/or kV according to patient size and/or use of iterative reconstruction technique.   COMPARISON:  12/24/2009.   FINDINGS: Cardiovascular: Atherosclerotic calcification of the aorta and left anterior descending coronary artery. Heart is at the upper limits of normal in size to mildly enlarged. No pericardial effusion.   Mediastinum/Nodes: No pathologically enlarged mediastinal or axillary lymph nodes. Hilar regions are difficult to definitively evaluate without IV contrast. Esophagus is grossly unremarkable.   Lungs/Pleura: Calcified granulomas. Negative for subpleural reticulation, traction bronchiectasis/bronchiolectasis, ground glass, architectural distortion or honeycombing. Mild bibasilar scarring. No pleural fluid. Airway is unremarkable. No air trapping.   Upper Abdomen: Partially imaged low-attenuation mass off the left kidney. No specific follow-up necessary. Visualized portions of the liver, gallbladder, adrenal glands, kidneys, spleen, pancreas, stomach and bowel are otherwise grossly unremarkable. No upper abdominal adenopathy.   Musculoskeletal:  Degenerative changes in the spine.   IMPRESSION: 1. No evidence of interstitial lung disease or air trapping. 2. Aortic atherosclerosis (ICD10-I70.0). Left anterior descending coronary artery calcification.     Electronically Signed   By: Shearon Denis M.D.   On: 06/16/2023 08:39      Result History  CT Chest High Resolution

## 2023-06-20 NOTE — ED Provider Notes (Signed)
 [place] [facility] AT [facility] Provider Note   CSN: [id number] Arrival date & time: 06/20/23  1441     History  Chief Complaint  Patient presents with   Hypoxia    Guy Payne is a 73 y.o. male.  HPI Patient presents for cough.  His medical history includes ALS.  At baseline, he is not on oxygen.  He is nonverbal.  He requires assistance with ADLs and transfers.  He receives feeding through a G-tube.  He takes very little by mouth.  For the past 10 days, he has had a cough.  On Friday, he received a pneumonia shot.  Since then, he has not fevers at home.  He was seen outpatient doctors office today and found to be hypoxic.  Wife reports that his oxygenation at home has been normal up until today.  Wife reports that they are also concerned about his G-tube.  Although he still has been receiving G-tube feeds, it did get snagged on something at home and she is worried it may be displaced.    Home Medications Prior to Admission medications   Medication Sig Start Date End Date Taking? Authorizing Provider  donepezil  ([name] ) 10 MG tablet TAKE 1 TABLET BY MOUTH EVERYDAY AT BEDTIME 11/14/19   Ahern, Antonia B, MD  glycopyrrolate (ROBINUL) 1 MG tablet Take 1 mg by mouth 3 (three) times daily.    [provider]  ipratropium-albuterol  (DUONEB) 0.5-2.5 (3) MG/3ML SOLN Take 3 mLs by nebulization every 6 (six) hours as needed. 05/23/23   Maire Scot, MD  predniSONE  (DELTASONE ) 10 MG tablet Please take prednisone  40 mg x1 day, then 30 mg x1 day, then 20 mg x1 day, then 10 mg x1 day, and then 5 mg x1 day and stop 05/23/23   Maire Scot, MD  Rosuvastatin Calcium 10 MG CPSP 1 tablet every Mon, Wed, Friday 05/27/22   [provider]  Sulfacetamide Sodium-Sulfur 10-2 % CREA APPLY TO FACE AT BEDTIME 03/19/15   [provider]      Allergies    Patient has no known allergies.    Review of Systems   Review of Systems  Unable to perform  ROS: Patient nonverbal    Physical Exam Updated Vital Signs BP 107/80 (BP Location: Left Arm)   Pulse (!) 103   Temp 99 F (37.2 C) (Oral)   Resp (!) 28   Ht 5\' 8"  (1.727 m)   Wt 57 kg   SpO2 98%   BMI 19.11 kg/m  Physical Exam Vitals and nursing note reviewed.  Constitutional:      General: He is not in acute distress.    Appearance: Normal appearance. He is well-developed. He is not toxic-appearing or diaphoretic.  HENT:     Head: Normocephalic and atraumatic.     Right Ear: External ear normal.     Left Ear: External ear normal.     Nose: Nose normal.     Mouth/Throat:     Mouth: Mucous membranes are moist.  Eyes:     Extraocular Movements: Extraocular movements intact.     Conjunctiva/sclera: Conjunctivae normal.  Cardiovascular:     Rate and Rhythm: Normal rate and regular rhythm.  Pulmonary:     Effort: Pulmonary effort is normal. No respiratory distress.     Breath sounds: Normal breath sounds. No wheezing or rales.  Abdominal:     General: There is no distension.     Palpations: Abdomen is soft.  Tenderness: There is no abdominal tenderness.  Musculoskeletal:        General: No swelling or deformity. Normal range of motion.     Cervical back: Normal range of motion and neck supple.     Right lower leg: No edema.     Left lower leg: No edema.  Skin:    General: Skin is warm and dry.     Coloration: Skin is not jaundiced or pale.  Neurological:     Mental Status: He is alert. Mental status is at baseline.  Psychiatric:        Mood and Affect: Mood normal.        Behavior: Behavior normal.     ED Results / Procedures / Treatments   Labs (all labs ordered are listed, but only abnormal results are displayed) Labs Reviewed  COMPREHENSIVE METABOLIC PANEL WITH GFR - Abnormal; Notable for the following components:      Result Value   Glucose, Bld 183 (*)    BUN 33 (*)    Albumin  3.4 (*)    All other components within normal limits  CBC WITH  DIFFERENTIAL/PLATELET - Abnormal; Notable for the following components:   WBC 12.7 (*)    All other components within normal limits  PROTIME-INR - Abnormal; Notable for the following components:   Prothrombin Time 15.5 (*)    All other components within normal limits  BLOOD GAS, VENOUS - Abnormal; Notable for the following components:   pH, Ven 7.5 (*)    pCO2, Ven 36 (*)    pO2, Ven 59 (*)    Bicarbonate 28.1 (*)    Acid-Base Excess 4.9 (*)    All other components within normal limits  I-STAT CG4 LACTIC ACID, ED - Abnormal; Notable for the following components:   Lactic Acid, Venous 2.9 (*)    All other components within normal limits  RESP PANEL BY RT-PCR (RSV, FLU A&B, COVID)  [id number]  CULTURE, BLOOD (ROUTINE X 2)  CULTURE, BLOOD (ROUTINE X 2)  URINALYSIS, W/ REFLEX TO CULTURE (INFECTION SUSPECTED)  I-STAT CG4 LACTIC ACID, ED    EKG EKG Interpretation Date/Time:  Tuesday June 20 2023 14:57:00 EDT Ventricular Rate:  123 PR Interval:  145 QRS Duration:  88 QT Interval:  295 QTC Calculation: 422 R Axis:   52  Text Interpretation: Sinus tachycardia LAE, consider biatrial enlargement Confirmed by Iva Mariner ([id number]) on 06/20/2023 3:49:17 PM  Radiology CT CHEST ABDOMEN PELVIS WO CONTRAST Result Date: 06/20/2023 CLINICAL DATA:  Sepsis, fever, cough, et al as Laurita Porta: CT CHEST, ABDOMEN AND PELVIS WITHOUT CONTRAST TECHNIQUE: Multidetector CT imaging of the chest, abdomen and pelvis was performed following the standard protocol without IV contrast. RADIATION DOSE REDUCTION: This exam was performed according to the departmental dose-optimization program which includes automated exposure control, adjustment of the mA and/or kV according to patient size and/or use of iterative reconstruction technique. COMPARISON:  05/31/2023 FINDINGS: CT CHEST FINDINGS Cardiovascular: Unenhanced imaging of the heart is unremarkable without pericardial effusion. Normal caliber of the thoracic aorta.  Atherosclerosis of the aorta and coronary vasculature. Assessment of the vascular lumen cannot be performed without IV contrast. Mediastinum/Nodes: No enlarged mediastinal, hilar, or axillary lymph nodes. Thyroid  gland, trachea, and esophagus demonstrate no significant findings. Lungs/Pleura: Extensive bibasilar airspace disease is identified, greatest in the left lower lobe, compatible with sequela of infection or aspiration. No effusion or pneumothorax. Central airways are patent. Musculoskeletal: No acute or destructive bony abnormalities. Reconstructed images demonstrate no additional findings. CT ABDOMEN  PELVIS FINDINGS Hepatobiliary: Unremarkable unenhanced appearance of the liver and gallbladder. Pancreas: Unremarkable unenhanced appearance. Spleen: Unremarkable unenhanced appearance. Adrenals/Urinary Tract: There is a 7 mm left renal cyst, with minimal mural calcification measuring 2 mm, favoring benign cyst. No urinary tract calculi or obstructive uropathy. The adrenals and bladder are unremarkable. Stomach/Bowel: No bowel obstruction or ileus. Normal appendix right lower quadrant. No bowel wall thickening or inflammatory change. Percutaneous gastrostomy tube balloon inflated within the gastric antrum, without evidence of acute complication. Vascular/Lymphatic: Aortic atherosclerosis. No enlarged abdominal or pelvic lymph nodes. Reproductive: Prior TURP. 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 Other: No free fluid or free intraperitoneal gas. No abdominal wall hernia. Musculoskeletal: No acute or destructive bony abnormalities. Reconstructed images demonstrate no additional findings. IMPRESSION: 1. Extensive bibasilar airspace disease compatible with infection or aspiration. 2. Minimally complex left renal cyst, likely benign. If further evaluation is desired, nonemergent outpatient renal ultrasound could be considered. 3. No acute intra-abdominal or intrapelvic process. 4.  Aortic  Atherosclerosis (ICD10-I70.0). Electronically Signed   By: Bobbye Burrow M.D.   On: 06/20/2023 16:30   DG Chest Port 1 View Result Date: 06/20/2023 CLINICAL DATA:  Sepsis, hypoxia, ALS EXAM: PORTABLE CHEST 1 VIEW COMPARISON:  06/01/2023 FINDINGS: Single frontal view of the chest demonstrates a stable cardiac silhouette. Bibasilar airspace disease, left greater than right. No effusion or pneumothorax. No acute bony abnormalities. IMPRESSION: 1. Bibasilar airspace disease consistent with pneumonia, aspiration, or edema. Electronically Signed   By: Bobbye Burrow M.D.   On: 06/20/2023 16:23   ECHOCARDIOGRAM COMPLETE Result Date: 06/20/2023    ECHOCARDIOGRAM REPORT   Patient Name:   Guy Payne Date of Exam: 06/20/2023 Medical Rec #:  [id number]      Height:       68.0 in Accession #:    [id number]     Weight:       127.0 lb Date of Birth:  1950/08/30      BSA:          1.685 m Patient Age:    73 years       BP:           90/60 mmHg Patient Gender: M              HR:           107 bpm. Exam Location:  Outpatient Procedure: 2D Echo, Cardiac Doppler and Color Doppler (Both Spectral and Color            Flow Doppler were utilized during procedure). Indications:    Dyspnea  History:        Patient has no prior history of Echocardiogram examinations.                 Risk Factors:Non-Smoker. ALS.  Sonographer:    Gelene Kelly RDCS Referring Phys: [phone] [facility]  Sonographer Comments: Image acquisition challenging due to respiratory motion and patient has feeding tube needs to have elevated to 30 degrees. Patient has continuous cough throughout exam and for the past week and a half. IMPRESSIONS  1. Left ventricular ejection fraction, by estimation, is 65 to 70%. Left ventricular ejection fraction by 3D volume is 68 %. The left ventricle has normal function. The left ventricle has no regional wall motion abnormalities. Left ventricular diastolic  parameters are consistent with Grade I diastolic dysfunction  (impaired relaxation).  2. Right ventricular systolic function is normal. The right ventricular size is normal.  3. The mitral valve  is normal in structure. No evidence of mitral valve regurgitation. No evidence of mitral stenosis.  4. The aortic valve is tricuspid. Aortic valve regurgitation is not visualized. No aortic stenosis is present. FINDINGS  Left Ventricle: Left ventricular ejection fraction, by estimation, is 65 to 70%. Left ventricular ejection fraction by 3D volume is 68 %. The left ventricle has normal function. The left ventricle has no regional wall motion abnormalities. The left ventricular internal cavity size was normal in size. There is no left ventricular hypertrophy. Left ventricular diastolic parameters are consistent with Grade I diastolic dysfunction (impaired relaxation). Normal left ventricular filling pressure. Right Ventricle: The right ventricular size is normal. No increase in right ventricular wall thickness. Right ventricular systolic function is normal. Left Atrium: Left atrial size was normal in size. Right Atrium: Right atrial size was normal in size. Pericardium: There is no evidence of pericardial effusion. Mitral Valve: The mitral valve is normal in structure. No evidence of mitral valve regurgitation. No evidence of mitral valve stenosis. Tricuspid Valve: The tricuspid valve is normal in structure. Tricuspid valve regurgitation is not demonstrated. No evidence of tricuspid stenosis. Aortic Valve: The aortic valve is tricuspid. Aortic valve regurgitation is not visualized. No aortic stenosis is present. Pulmonic Valve: The pulmonic valve was normal in structure. Pulmonic valve regurgitation is not visualized. No evidence of pulmonic stenosis. Aorta: The aortic root is normal in size and structure. Venous: Not well visualized due feeding tube placement and continous cough. IAS/Shunts: No atrial level shunt detected by color flow Doppler. Additional Comments: 3D was performed not  requiring image post processing on an independent workstation and was normal.  LEFT VENTRICLE PLAX 2D LVIDd:         3.54 cm LVIDs:         2.52 cm LV PW:         0.86 cm         3D Volume EF LV IVS:        1.01 cm         LV 3D EF:    Left                                             ventricul                                             ar                                             ejection                                             fraction                                             by 3D  volume is                                             68 %.                                 3D Volume EF:                                3D EF:        68 % Maudine Sos MD Electronically signed by Maudine Sos MD Signature Date/Time: 06/20/2023/1:53:04 PM    Final     Procedures Procedures    Medications Ordered in ED Medications  lactated ringers infusion (has no administration in time range)  metroNIDAZOLE (FLAGYL) IVPB 500 mg (500 mg Intravenous New Bag/Given 06/20/23 1615)  vancomycin (VANCOCIN) IVPB 1000 mg/200 mL premix (1,000 mg Intravenous New Bag/Given 06/20/23 1701)  lactated ringers bolus 1,000 mL (0 mLs Intravenous Stopped 06/20/23 1555)    And  lactated ringers bolus 500 mL (0 mLs Intravenous Stopped 06/20/23 1622)    And  lactated ringers bolus 250 mL (0 mLs Intravenous Stopped 06/20/23 1610)  ceFEPIme (MAXIPIME) 2 g in sodium chloride 0.9 % 100 mL IVPB (0 g Intravenous Stopped 06/20/23 1610)  acetaminophen (TYLENOL) 160 MG/5ML solution 650 mg (650 mg Per Tube Given 06/20/23 1602)    ED Course/ Medical Decision Making/ A&P                                 Medical Decision Making Amount and/or Complexity of Data Reviewed Labs: ordered. Radiology: ordered.  Risk OTC drugs. Prescription drug management.   This patient presents to the ED for concern of cough and shortness of breath, this involves an extensive number of treatment options, and  is a complaint that carries with it a high risk of complications and morbidity.  The differential diagnosis includes pneumonia, progression of ALS, aspiration, acidosis   Co morbidities that complicate the patient evaluation  ALS   Additional history obtained:  Additional history obtained from patient's wife External records from outside source obtained and reviewed including EMR   Lab Tests:  I Ordered, and personally interpreted labs.  The pertinent results include: Leukocytosis and lactic acidosis consistent with sepsis; normal kidney function, normal electrolytes   Imaging Studies ordered:  I ordered imaging studies including chest x-ray, CT of chest, abdomen, pelvis I independently visualized and interpreted imaging which showed bibasilar pneumonia I agree with the radiologist interpretation   Cardiac Monitoring: / EKG:  The patient was maintained on a cardiac monitor.  I personally viewed and interpreted the cardiac monitored which showed an underlying rhythm of: Sinus rhythm   Problem List / ED Course / Critical interventions / Medication management  Patient presents for cough and hypoxia.  Hypoxia identified today at outpatient doctors office.  He was reportedly 84% on room air.  He is not on oxygen at baseline.  On arrival, patient is tachycardic, tachypneic and likely febrile.  Septic workup and treatment were initiated.  X-ray and CT imaging confirm multifocal pneumonia.  No other acute findings were identified.  Lab work shows leukocytosis and lactic acidosis consistent with sepsis.  Patient  currently requiring 2 L of supplemental oxygen.  He was admitted for further management. I ordered medication including IV fluids and broad-spectrum antibiotics for sepsis; Tylenol for antipyresis Reevaluation of the patient after these medicines showed that the patient improved I have reviewed the patients home medicines and have made adjustments as needed   Social Determinants  of Health:  Nonverbal and nonambulatory at baseline secondary to advanced ALS.  CRITICAL CARE Performed by: Iva Mariner   Total critical care time: 34 minutes  Critical care time was exclusive of separately billable procedures and treating other patients.  Critical care was necessary to treat or prevent imminent or life-threatening deterioration.  Critical care was time spent personally by me on the following activities: development of treatment plan with patient and/or surrogate as well as nursing, discussions with consultants, evaluation of patient's response to treatment, examination of patient, obtaining history from patient or surrogate, ordering and performing treatments and interventions, ordering and review of laboratory studies, ordering and review of radiographic studies, pulse oximetry and re-evaluation of patient's condition.          Final Clinical Impression(s) / ED Diagnoses Final diagnoses:  Multifocal pneumonia  Sepsis with acute hypoxic respiratory failure without septic shock, due to unspecified organism [facility])    Rx / DC Orders ED Discharge Orders     None         Iva Mariner, MD 06/20/23 1710

## 2023-06-20 NOTE — Progress Notes (Signed)
 Pt NTS per MD order.  Pt was on 2l [place] with sats of 94%, RR 26 and HR 95.  I was able to get some thick white small amout of secretions and a lot of clear oral secretions.  Pt tolerated well.  Wife at bedside and RN updated.

## 2023-06-20 NOTE — Sepsis Progress Note (Signed)
 Sepsis protocol monitored by eLink ?

## 2023-06-20 NOTE — Telephone Encounter (Signed)
 Guy Payne -while I ordered the echo if there is concern about feeding tube being displaced or dislodged then they should reach out to the person of the surgeon who placed the feeding tube in the first place or the physician who manages the feeding tube.  I am not the person who manages the feeding tube

## 2023-06-20 NOTE — Assessment & Plan Note (Addendum)
 Patient currently on Unasyn and azithromycin Adding intravenous vancomycin  Obtaining MRSA PCR, respiratory viral panel, strep pneumonia urinary antigen, Legionella urinary antigen  Following blood cultures  Acute provide patient with supplemental oxygen  SIRS improving

## 2023-06-20 NOTE — Telephone Encounter (Signed)
 Copied from CRM [id number]. Topic: Clinical - Red Word Triage >> Jun 20, 2023 10:28 AM Isabell A wrote: Red Word that prompted transfer to Nurse Triage: Spouse states patients feeding tube may be misplaced - currently at the hospital for echocardiogram & has been non stop coughing.

## 2023-06-20 NOTE — ED Notes (Signed)
 Transported to Ct Scan per stretcher.

## 2023-06-20 NOTE — Assessment & Plan Note (Signed)
 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  PEG tube in situ.  Nutrition evaluation sought for initiating tube feed.  Will give IV hydration tonight.

## 2023-06-20 NOTE — ED Notes (Signed)
 Britta Candy, RT at bedside suctioning the patient on the mouth using suction catheter

## 2023-06-20 NOTE — ED Notes (Signed)
 Called the floor twice with no answer.

## 2023-06-20 NOTE — Telephone Encounter (Signed)
 Pt Is currently headed to the ER

## 2023-06-20 NOTE — H&P (Addendum)
 History and Physical    Patient: Guy Payne:[id number] DOB: 21-Jun-1950 DOA: 06/20/2023 DOS: the patient was seen and examined on 06/20/2023 PCP: Mordechai April, DO  Patient coming from: Home  Chief Complaint:  Chief Complaint  Patient presents with   Hypoxia   HPI: Guy Payne is a 73 y.o. male with medical history significant of ALS, tongue palsy, inabilit to talk/speak.  However patient is generally able to transfer/walk with assistance.  Patient is a fall risk.  Normally on room air.  N.p.o. and gets PEG tube feeding.  History is obtained primarily from the wife at the bedside.  Patient was in his usual state of health till about 7 days ago when he reports a new onset of cough which was wet sounding.  Subsequently about 4 days ago patient started having new fevers between 101 and 103 F.  And patient was noted to be breathing rapidly and have a grunting sound to his breathing.  Patient was evaluated at his primary care office today, noted to be hypoxic.  Patient was sent to [facility], ER.  Where patient is tachypneic tachycardic and having a fever meet sepsis criteria.  Chest x-ray shows pneumonia bibasilar.  Patient has been started on cefepime vancomycin and metronidazole.  Has gotten some fluids and is stabilized on 2 L/min of supplementary oxygen.  Medical evaluation is sought.  There is no report of dysuria, vomiting, diarrhea, rash on the skin.  Review of Systems: As mentioned in the history of present illness. All other systems reviewed and are negative.. Patient is able to answer yes or no questions and follow directions to the extent that he is not having palsy. Past Medical History:  Diagnosis Date   ALS (amyotrophic lateral sclerosis) (HCC)    Fall 2021   MCI (mild cognitive impairment)    Memory loss    Prostate disorder    blockage   Restless leg    Past Surgical History:  Procedure Laterality Date   genital surgery     was not specific   NO PAST  SURGERIES     prostate repair for blockage     Social History:  reports that he has never smoked. He has never used smokeless tobacco. He reports that he does not currently use alcohol. He reports that he does not use drugs.  No Known Allergies  Family History  Problem Relation Age of Onset   Dementia Neg Hx     Prior to Admission medications   Medication Sig Start Date End Date Taking? Authorizing Provider  donepezil  ([name] ) 10 MG tablet TAKE 1 TABLET BY MOUTH EVERYDAY AT BEDTIME 11/14/19   Ahern, Antonia B, MD  glycopyrrolate (ROBINUL) 1 MG tablet Take 1 mg by mouth 3 (three) times daily.    [provider]  ipratropium-albuterol  (DUONEB) 0.5-2.5 (3) MG/3ML SOLN Take 3 mLs by nebulization every 6 (six) hours as needed. 05/23/23   Maire Scot, MD  predniSONE  (DELTASONE ) 10 MG tablet Please take prednisone  40 mg x1 day, then 30 mg x1 day, then 20 mg x1 day, then 10 mg x1 day, and then 5 mg x1 day and stop 05/23/23   Maire Scot, MD  Rosuvastatin Calcium 10 MG CPSP 1 tablet every Mon, Wed, Friday 05/27/22   [provider]  Sulfacetamide Sodium-Sulfur 10-2 % CREA APPLY TO FACE AT BEDTIME 03/19/15   [provider]    Physical Exam: Vitals:   06/20/23 1700 06/20/23 1715 06/20/23 1730 06/20/23 1745  BP:  107/80 109/73 (!) 131/91 126/63  Pulse: (!) 103 (!) 112 (!) 114 (!) 111  Resp: (!) 28 (!) 27 (!) 24 (!) 42  Temp: 99 F (37.2 C)  98.9 F (37.2 C)   TempSrc: Oral  Oral   SpO2: 98% 98% 95% 96%  Weight:      Height:       General: Patient is alert and awake thin gentleman.  Follows direction makes eye contact.  Makes an okay sign.  However he is notably tachypneic making grunting sounds. Respiratory exam: Left basilar crackles Cardiovascular exam S1-S2 normal, tachycardia Abdomen all quadrants soft PEG tube in situ Extremities warm without edema follows direction with all 4 extremities.,  Patient is stable to sit up in bed. Data Reviewed:  Labs  on Admission:  Results for orders placed or performed during the hospital encounter of 06/20/23 (from the past 24 hours)  Comprehensive metabolic panel     Status: Abnormal   Collection Time: 06/20/23  3:03 PM  Result Value Ref Range   Sodium 139 135 - 145 mmol/L   Potassium 4.5 3.5 - 5.1 mmol/L   Chloride 104 98 - 111 mmol/L   CO2 24 22 - 32 mmol/L   Glucose, Bld 183 (H) 70 - 99 mg/dL   BUN 33 (H) 8 - 23 mg/dL   Creatinine, Ser 2.13 0.61 - 1.24 mg/dL   Calcium 8.9 8.9 - 08.6 mg/dL   Total Protein 7.5 6.5 - 8.1 g/dL   Albumin  3.4 (L) 3.5 - 5.0 g/dL   AST 25 15 - 41 U/L   ALT 25 0 - 44 U/L   Alkaline Phosphatase 67 38 - 126 U/L   Total Bilirubin 0.8 0.0 - 1.2 mg/dL   GFR, Estimated >57 >84 mL/min   Anion gap 11 5 - 15  CBC with Differential     Status: Abnormal (Preliminary result)   Collection Time: 06/20/23  3:03 PM  Result Value Ref Range   WBC 12.7 (H) 4.0 - 10.5 K/uL   RBC 4.54 4.22 - 5.81 MIL/uL   Hemoglobin 14.2 13.0 - 17.0 g/dL   HCT 69.6 29.5 - 28.4 %   MCV 96.3 80.0 - 100.0 fL   MCH 31.3 26.0 - 34.0 pg   MCHC 32.5 30.0 - 36.0 g/dL   RDW 13.2 44.0 - 10.2 %   Platelets 227 150 - 400 K/uL   nRBC 0.0 0.0 - 0.2 %   Neutrophils Relative % PENDING %   Neutro Abs PENDING 1.7 - 7.7 K/uL   Band Neutrophils PENDING %   Lymphocytes Relative PENDING %   Lymphs Abs PENDING 0.7 - 4.0 K/uL   Monocytes Relative PENDING %   Monocytes Absolute PENDING 0.1 - 1.0 K/uL   Eosinophils Relative PENDING %   Eosinophils Absolute PENDING 0.0 - 0.5 K/uL   Basophils Relative PENDING %   Basophils Absolute PENDING 0.0 - 0.1 K/uL   WBC Morphology PENDING    RBC Morphology PENDING    Smear Review PENDING    Other PENDING %   nRBC PENDING 0 /100 WBC   Metamyelocytes Relative PENDING %   Myelocytes PENDING %   Promyelocytes Relative PENDING %   Blasts PENDING %   Immature Granulocytes PENDING %   Abs Immature Granulocytes PENDING 0.00 - 0.07 K/uL  Protime-INR     Status: Abnormal    Collection Time: 06/20/23  3:03 PM  Result Value Ref Range   Prothrombin Time 15.5 (H) 11.4 - 15.2 seconds  INR 1.2 0.8 - 1.2  Blood gas, venous ([facility], [facility], [facility])     Status: Abnormal   Collection Time: 06/20/23  3:03 PM  Result Value Ref Range   pH, Ven 7.5 (H) 7.25 - 7.43   pCO2, Ven 36 (L) 44 - 60 mmHg   pO2, Ven 59 (H) 32 - 45 mmHg   Bicarbonate 28.1 (H) 20.0 - 28.0 mmol/L   Acid-Base Excess 4.9 (H) 0.0 - 2.0 mmol/L   O2 Saturation 93.8 %   Patient temperature 37.0   Resp panel by RT-PCR (RSV, Flu A&B, Covid) Anterior Nasal Swab     Status: None   Collection Time: 06/20/23  3:35 PM   Specimen: Anterior Nasal Swab  Result Value Ref Range   SARS Coronavirus 2 by RT PCR NEGATIVE NEGATIVE   Influenza A by PCR NEGATIVE NEGATIVE   Influenza B by PCR NEGATIVE NEGATIVE   Resp Syncytial Virus by PCR NEGATIVE NEGATIVE  I-Stat Lactic Acid, ED     Status: Abnormal   Collection Time: 06/20/23  4:02 PM  Result Value Ref Range   Lactic Acid, Venous 2.9 (HH) 0.5 - 1.9 mmol/L   Comment NOTIFIED PHYSICIAN   I-Stat Lactic Acid, ED     Status: Abnormal   Collection Time: 06/20/23  5:13 PM  Result Value Ref Range   Lactic Acid, Venous 2.0 (HH) 0.5 - 1.9 mmol/L   Basic Metabolic Panel: Recent Labs  Lab 06/20/23 1503  NA 139  K 4.5  CL 104  CO2 24  GLUCOSE 183*  BUN 33*  CREATININE 0.76  CALCIUM 8.9   Liver Function Tests: Recent Labs  Lab 06/20/23 1503  AST 25  ALT 25  ALKPHOS 67  BILITOT 0.8  PROT 7.5  ALBUMIN  3.4*   No results for input(s): "LIPASE", "AMYLASE" in the last 168 hours. No results for input(s): "AMMONIA" in the last 168 hours. CBC: Recent Labs  Lab 06/20/23 1503  WBC 12.7*  NEUTROABS PENDING  HGB 14.2  HCT 43.7  MCV 96.3  PLT 227   Cardiac Enzymes: No results for input(s): "CKTOTAL", "CKMB", "CKMBINDEX", "TROPONINIHS" in the last 168 hours.  BNP (last 3 results) Recent Labs    05/23/23 0940  PROBNP 20.0   CBG: No results for input(s): "GLUCAP"  in the last 168 hours.  Radiological Exams on Admission:  CT CHEST ABDOMEN PELVIS WO CONTRAST Result Date: 06/20/2023 CLINICAL DATA:  Sepsis, fever, cough, et al as Laurita Porta: CT CHEST, ABDOMEN AND PELVIS WITHOUT CONTRAST TECHNIQUE: Multidetector CT imaging of the chest, abdomen and pelvis was performed following the standard protocol without IV contrast. RADIATION DOSE REDUCTION: This exam was performed according to the departmental dose-optimization program which includes automated exposure control, adjustment of the mA and/or kV according to patient size and/or use of iterative reconstruction technique. COMPARISON:  05/31/2023 FINDINGS: CT CHEST FINDINGS Cardiovascular: Unenhanced imaging of the heart is unremarkable without pericardial effusion. Normal caliber of the thoracic aorta. Atherosclerosis of the aorta and coronary vasculature. Assessment of the vascular lumen cannot be performed without IV contrast. Mediastinum/Nodes: No enlarged mediastinal, hilar, or axillary lymph nodes. Thyroid  gland, trachea, and esophagus demonstrate no significant findings. Lungs/Pleura: Extensive bibasilar airspace disease is identified, greatest in the left lower lobe, compatible with sequela of infection or aspiration. No effusion or pneumothorax. Central airways are patent. Musculoskeletal: No acute or destructive bony abnormalities. Reconstructed images demonstrate no additional findings. CT ABDOMEN PELVIS FINDINGS Hepatobiliary: Unremarkable unenhanced appearance of the liver and gallbladder. Pancreas: Unremarkable unenhanced appearance.  Spleen: Unremarkable unenhanced appearance. Adrenals/Urinary Tract: There is a 7 mm left renal cyst, with minimal mural calcification measuring 2 mm, favoring benign cyst. No urinary tract calculi or obstructive uropathy. The adrenals and bladder are unremarkable. Stomach/Bowel: No bowel obstruction or ileus. Normal appendix right lower quadrant. No bowel wall thickening or inflammatory  change. Percutaneous gastrostomy tube balloon inflated within the gastric antrum, without evidence of acute complication. Vascular/Lymphatic: Aortic atherosclerosis. No enlarged abdominal or pelvic lymph nodes. Reproductive: Prior TURP. 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 Other: No free fluid or free intraperitoneal gas. No abdominal wall hernia. Musculoskeletal: No acute or destructive bony abnormalities. Reconstructed images demonstrate no additional findings. IMPRESSION: 1. Extensive bibasilar airspace disease compatible with infection or aspiration. 2. Minimally complex left renal cyst, likely benign. If further evaluation is desired, nonemergent outpatient renal ultrasound could be considered. 3. No acute intra-abdominal or intrapelvic process. 4.  Aortic Atherosclerosis (ICD10-I70.0). Electronically Signed   By: Bobbye Burrow M.D.   On: 06/20/2023 16:30   DG Chest Port 1 View Result Date: 06/20/2023 CLINICAL DATA:  Sepsis, hypoxia, ALS EXAM: PORTABLE CHEST 1 VIEW COMPARISON:  06/01/2023 FINDINGS: Single frontal view of the chest demonstrates a stable cardiac silhouette. Bibasilar airspace disease, left greater than right. No effusion or pneumothorax. No acute bony abnormalities. IMPRESSION: 1. Bibasilar airspace disease consistent with pneumonia, aspiration, or edema. Electronically Signed   By: Bobbye Burrow M.D.   On: 06/20/2023 16:23   ECHOCARDIOGRAM COMPLETE Result Date: 06/20/2023    ECHOCARDIOGRAM REPORT   Patient Name:   IDUS STOESSEL Date of Exam: 06/20/2023 Medical Rec #:  [id number]      Height:       68.0 in Accession #:    [id number]     Weight:       127.0 lb Date of Birth:  11/20/1950      BSA:          1.685 m Patient Age:    73 years       BP:           90/60 mmHg Patient Gender: M              HR:           107 bpm. Exam Location:  Outpatient Procedure: 2D Echo, Cardiac Doppler and Color Doppler (Both Spectral and Color            Flow Doppler were utilized  during procedure). Indications:    Dyspnea  History:        Patient has no prior history of Echocardiogram examinations.                 Risk Factors:Non-Smoker. ALS.  Sonographer:    Gelene Kelly RDCS Referring Phys: [phone] [facility]  Sonographer Comments: Image acquisition challenging due to respiratory motion and patient has feeding tube needs to have elevated to 30 degrees. Patient has continuous cough throughout exam and for the past week and a half. IMPRESSIONS  1. Left ventricular ejection fraction, by estimation, is 65 to 70%. Left ventricular ejection fraction by 3D volume is 68 %. The left ventricle has normal function. The left ventricle has no regional wall motion abnormalities. Left ventricular diastolic  parameters are consistent with Grade I diastolic dysfunction (impaired relaxation).  2. Right ventricular systolic function is normal. The right ventricular size is normal.  3. The mitral valve is normal in structure. No evidence of mitral valve regurgitation. No evidence of mitral stenosis.  4. The aortic valve is tricuspid. Aortic valve regurgitation is not visualized. No aortic stenosis is present. FINDINGS  Left Ventricle: Left ventricular ejection fraction, by estimation, is 65 to 70%. Left ventricular ejection fraction by 3D volume is 68 %. The left ventricle has normal function. The left ventricle has no regional wall motion abnormalities. The left ventricular internal cavity size was normal in size. There is no left ventricular hypertrophy. Left ventricular diastolic parameters are consistent with Grade I diastolic dysfunction (impaired relaxation). Normal left ventricular filling pressure. Right Ventricle: The right ventricular size is normal. No increase in right ventricular wall thickness. Right ventricular systolic function is normal. Left [facility]: Left atrial size was normal in size. Right [facility]: Right atrial size was normal in size. Pericardium: There is no evidence of pericardial  effusion. Mitral Valve: The mitral valve is normal in structure. No evidence of mitral valve regurgitation. No evidence of mitral valve stenosis. Tricuspid Valve: The tricuspid valve is normal in structure. Tricuspid valve regurgitation is not demonstrated. No evidence of tricuspid stenosis. Aortic Valve: The aortic valve is tricuspid. Aortic valve regurgitation is not visualized. No aortic stenosis is present. Pulmonic Valve: The pulmonic valve was normal in structure. Pulmonic valve regurgitation is not visualized. No evidence of pulmonic stenosis. Aorta: The aortic root is normal in size and structure. Venous: Not well visualized due feeding tube placement and continous cough. IAS/Shunts: No atrial level shunt detected by color flow Doppler. Additional Comments: 3D was performed not requiring image post processing on an independent workstation and was normal.  LEFT VENTRICLE PLAX 2D LVIDd:         3.54 cm LVIDs:         2.52 cm LV PW:         0.86 cm         3D Volume EF LV IVS:        1.01 cm         LV 3D EF:    Left                                             ventricul                                             ar                                             ejection                                             fraction                                             by 3D  volume is                                             68 %.                                 3D Volume EF:                                3D EF:        68 % Maudine Sos MD Electronically signed by Maudine Sos MD Signature Date/Time: 06/20/2023/1:53:04 PM    Final     chest X-ray    No intake/output data recorded. Total I/O In: 1926.9 [IV Piggyback:1926.9] Out: -    EKG - sinus tachy. No ST_T wave changes.     Assessment and Plan: * Pneumonia Associated with hypoxemia acute requiring 2 L/min of supplementary oxygen as well as sepsis.  Present on admission.  Lactic acidosis  that has responded well to 1.75 L of fluid infusion.  Now down to 2 from 2.9.  S/p vanco+cefepime+flagyl. Will c.w. uansyn+azithro to cover for aspiration pneumonia.  Patient does have difficulty maintaining upper airway secretions given his tongue palsy.  Suction as needed.  Patient is able to spit up saliva and uses a cloth to expectorate it. hold with glycopyrrolate as needed to thin the secretions.  We will also maintain patient on continuous pulse oximetry. NPO. Duoneb, mucomyst  ALS (amyotrophic lateral sclerosis) (HCC) 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  PEG tube in situ.  Nutrition evaluation sought for initiating tube feed.  Will give IV hydration tonight.  Renal cyst Incidental. See CT above, outpatient eval.  Dvt ppx - lvoenox.  Please review med rec after pharmacy input as well.    Advance Care Planning:   Code Status: Full Code   Consults: none at this time.  Family Communication: wife at bedside. All questoins answred.  Severity of Illness: The appropriate patient status for this patient is INPATIENT. Inpatient status is judged to be reasonable and necessary in order to provide the required intensity of service to ensure the patient's safety. The patient's presenting symptoms, physical exam findings, and initial radiographic and laboratory data in the context of their chronic comorbidities is felt to place them at high risk for further clinical deterioration. Furthermore, it is not anticipated that the patient will be medically stable for discharge from the hospital within 2 midnights of admission.   * I certify that at the point of admission it is my clinical judgment that the patient will require inpatient hospital care spanning beyond 2 midnights from the point of admission due to high intensity of service, high risk for further deterioration and high frequency of surveillance required.*  Author: Bennie Brave, MD 06/20/2023 6:03 PM  For on call  review [email or web address].

## 2023-06-21 DIAGNOSIS — J9601 Acute respiratory failure with hypoxia: Secondary | ICD-10-CM

## 2023-06-21 DIAGNOSIS — G1221 Amyotrophic lateral sclerosis: Secondary | ICD-10-CM | POA: Diagnosis not present

## 2023-06-21 DIAGNOSIS — J189 Pneumonia, unspecified organism: Secondary | ICD-10-CM | POA: Diagnosis not present

## 2023-06-21 DIAGNOSIS — E43 Unspecified severe protein-calorie malnutrition: Secondary | ICD-10-CM | POA: Diagnosis not present

## 2023-06-21 DIAGNOSIS — A403 Sepsis due to Streptococcus pneumoniae: Secondary | ICD-10-CM | POA: Diagnosis not present

## 2023-06-21 DIAGNOSIS — R6520 Severe sepsis without septic shock: Secondary | ICD-10-CM | POA: Insufficient documentation

## 2023-06-21 LAB — RESPIRATORY PANEL BY PCR

## 2023-06-21 LAB — URINALYSIS, W/ REFLEX TO CULTURE (INFECTION SUSPECTED)
Bacteria, UA: NONE SEEN
Bilirubin Urine: NEGATIVE
Glucose, UA: 50 mg/dL — AB
Hgb urine dipstick: NEGATIVE
Ketones, ur: NEGATIVE mg/dL
Leukocytes,Ua: NEGATIVE
Nitrite: NEGATIVE
Protein, ur: NEGATIVE mg/dL
Specific Gravity, Urine: 1.016 (ref 1.005–1.030)
pH: 6 (ref 5.0–8.0)

## 2023-06-21 LAB — PROTIME-INR
INR: 1.3 — ABNORMAL HIGH (ref 0.8–1.2)
Prothrombin Time: 16.4 s — ABNORMAL HIGH (ref 11.4–15.2)

## 2023-06-21 LAB — CBC
HCT: 35.7 % — ABNORMAL LOW (ref 39.0–52.0)
Hemoglobin: 11.7 g/dL — ABNORMAL LOW (ref 13.0–17.0)
MCH: 31.2 pg (ref 26.0–34.0)
MCHC: 32.8 g/dL (ref 30.0–36.0)
MCV: 95.2 fL (ref 80.0–100.0)
Platelets: 196 10*3/uL (ref 150–400)
RBC: 3.75 MIL/uL — ABNORMAL LOW (ref 4.22–5.81)
RDW: 13.1 % (ref 11.5–15.5)
WBC: 10.1 10*3/uL (ref 4.0–10.5)
nRBC: 0 % (ref 0.0–0.2)

## 2023-06-21 LAB — BASIC METABOLIC PANEL WITH GFR
Anion gap: 8 (ref 5–15)
BUN: 23 mg/dL (ref 8–23)
CO2: 25 mmol/L (ref 22–32)
Calcium: 8.2 mg/dL — ABNORMAL LOW (ref 8.9–10.3)
Chloride: 107 mmol/L (ref 98–111)
Creatinine, Ser: 0.69 mg/dL (ref 0.61–1.24)
GFR, Estimated: >60 mL/min (ref >60)
Glucose, Bld: 135 mg/dL — ABNORMAL HIGH (ref 70–99)
Potassium: 3.8 mmol/L (ref 3.5–5.1)
Sodium: 140 mmol/L (ref 135–145)

## 2023-06-21 LAB — LACTIC ACID, PLASMA: Lactic Acid, Venous: 1.6 mmol/L (ref 0.5–1.9)

## 2023-06-21 LAB — PROCALCITONIN: Procalcitonin: 4.13 ng/mL

## 2023-06-21 LAB — MRSA NEXT GEN BY PCR, NASAL: MRSA by PCR Next Gen: DETECTED — AB

## 2023-06-21 LAB — GLUCOSE, CAPILLARY
Glucose-Capillary: 113 mg/dL — ABNORMAL HIGH (ref 70–99)
Glucose-Capillary: 138 mg/dL — ABNORMAL HIGH (ref 70–99)
Glucose-Capillary: 139 mg/dL — ABNORMAL HIGH (ref 70–99)
Glucose-Capillary: 149 mg/dL — ABNORMAL HIGH (ref 70–99)
Glucose-Capillary: 160 mg/dL — ABNORMAL HIGH (ref 70–99)
Glucose-Capillary: 164 mg/dL — ABNORMAL HIGH (ref 70–99)

## 2023-06-21 LAB — MAGNESIUM: Magnesium: 1.9 mg/dL (ref 1.7–2.4)

## 2023-06-21 LAB — C-REACTIVE PROTEIN: CRP: 21 mg/dL — ABNORMAL HIGH (ref <1.0)

## 2023-06-21 LAB — APTT: aPTT: 31 s (ref 24–36)

## 2023-06-21 LAB — PHOSPHORUS: Phosphorus: 2.7 mg/dL (ref 2.5–4.6)

## 2023-06-21 MED ORDER — ORAL CARE MOUTH RINSE
15.0000 mL | OROMUCOSAL | Status: DC
  Administered 2023-06-22 – 2023-06-27 (×21): 15 mL via OROMUCOSAL

## 2023-06-21 MED ORDER — VANCOMYCIN HCL 1250 MG/250ML IV SOLN
1250.0000 mg | INTRAVENOUS | Status: DC
  Administered 2023-06-22: 1250 mg via INTRAVENOUS
  Filled 2023-06-21 (×2): qty 250

## 2023-06-21 MED ORDER — PROSOURCE TF20 ENFIT COMPATIBL EN LIQD
60.0000 mL | Freq: Every day | ENTERAL | Status: DC
  Administered 2023-06-22 – 2023-06-27 (×6): 60 mL
  Filled 2023-06-21 (×7): qty 60

## 2023-06-21 MED ORDER — JEVITY 1.5 CAL/FIBER PO LIQD
1000.0000 mL | ORAL | Status: AC
Start: 2023-06-21 — End: ?
  Administered 2023-06-21 – 2023-06-26 (×5): 1000 mL
  Filled 2023-06-21 (×10): qty 1000

## 2023-06-21 MED ORDER — IPRATROPIUM-ALBUTEROL 0.5-2.5 (3) MG/3ML IN SOLN
3.0000 mL | Freq: Four times a day (QID) | RESPIRATORY_TRACT | Status: DC
  Filled 2023-06-21: qty 3

## 2023-06-21 MED ORDER — FREE WATER
150.0000 mL | Status: DC
  Administered 2023-06-21 – 2023-06-27 (×35): 150 mL

## 2023-06-21 MED ORDER — ORAL CARE MOUTH RINSE: 15.0000 mL | OROMUCOSAL | Status: DC | PRN

## 2023-06-21 NOTE — Progress Notes (Signed)
 Initial Nutrition Assessment  DOCUMENTATION CODES:   Severe malnutrition in context of chronic illness  INTERVENTION:  - Initiate tube feeding via PEG: Jevity 1.5 at 60 ml/h (1440 ml per day) *Start at 71mL/hr and advance by 10mL Q12H  Provides 2160 kcal, 92 gm protein, 1094 ml free water daily  - Add FWF Q4H to provide a total of 1928mL/day of free water (TF+flushes).   - Monitor magnesium, potassium, and phosphorus BID for at least 3 days, MD to replete as needed, as pt is at risk for refeeding syndrome given inadequate EN for several days with severe malnutrition.  - Monitor weight trends.   NUTRITION DIAGNOSIS:   Severe Malnutrition related to chronic illness (ALS; dysphagia) as evidenced by severe fat depletion, severe muscle depletion, percent weight loss (19% weight loss in ~2 months).  GOAL:   Patient will meet greater than or equal to 90% of their needs  MONITOR:   Labs, Weight trends, TF tolerance  REASON FOR ASSESSMENT:   Consult Assessment of nutrition requirement/status, Enteral/tube feeding initiation and management  ASSESSMENT:   73 y.o. male with PMH significant of ALS, tongue palsy with inability to talk/speak, PEG tube feeding (placed Jan 2025) who presented with a 7 day history of new onset of cough and fever. Admitted for PNA.   Patient in bed doing a word search at time of visit. Noted to be unable to talk. Wife at bedside provided nutrition history.   UBW reported to be 142# and she states patient last weighed this in November and dropped significantly to 115# by January due to difficulty swallowing. She reports that this was when patient had his PEG placed and he has since been slowly gaining weight back.  EMR confirms trends. Patient weighed at 143# in November and dropped to 115# by January. This is a 28# or 19% weight loss in~2 months. Patient has since gained back up and weighing between 125-128# over the past 1 month.   They confirm he  does not eat by mouth and uses PEG for nutrition, which was placed on 03/07/23 per [facility] notes. Wife reports the patient has had a swallow study done and was cleared for some PO for comfort but patient prefers not to take anything in by mouth.   Patient's home TF regimen as below: IsoSource 1.5 with fiber 1.25 (250mL) cartons 4x/day + 1 carton in the evening = 6 cartons/day Free water: 90mL flush before and after each bolus feeding and 1 additional 90mL bolus during the day  Wife reports patient was tolerating well but notes that as he has not been feeling well for 1 week he hasn't gotten his full regimen in several days.   Patient was started on trickles of Jevity 1.5 overnight by ED.  Discussed with attending Dr. Lanette Pipe today and can begin increasing patient to goal. Will increase slowly due to patient not having had full nutrition for several days.  Will plan to keep patient on continuous feeds during admission due to admit for PNA with ongoing coughing and need for supplemental oxygen. Wife and MD agreeable to plan.   Medications reviewed and include: D5 @ 44mL/hr (provides 306 kcals over 24 hours) - ends today  Labs reviewed:  -   NUTRITION - FOCUSED PHYSICAL EXAM:  Flowsheet Row Most Recent Value  Orbital Region Severe depletion  Upper Arm Region Severe depletion  Thoracic and Lumbar Region Severe depletion  Buccal Region Severe depletion  Temple Region Severe depletion  Clavicle Bone Region  Severe depletion  Clavicle and Acromion Bone Region Severe depletion  Scapular Bone Region Unable to assess  Dorsal Hand Severe depletion  Patellar Region Severe depletion  Anterior Thigh Region Severe depletion  Posterior Calf Region Severe depletion  Edema (RD Assessment) None  Hair Reviewed  Eyes Reviewed  Mouth Reviewed  Skin Reviewed  Nails Reviewed       Diet Order:   Diet Order             Diet NPO time specified  Diet effective now                    EDUCATION NEEDS:  Education needs have been addressed  Skin:  Skin Assessment: Reviewed RN Assessment  Last BM:  4/28  Height:  Ht Readings from Last 1 Encounters:  06/20/23 5\' 8"  (1.727 m)   Weight:  Wt Readings from Last 1 Encounters:  06/20/23 57 kg    BMI:  Body mass index is 19.11 kg/m.  Estimated Nutritional Needs:  Kcal:  2000-2150 kcals Protein:  85-105 grams Fluid:  >/= 2L    Scheryl Cushing RD, LDN Contact via Secure Chat.

## 2023-06-21 NOTE — Progress Notes (Signed)
 PROGRESS NOTE   Guy Payne  ZOX:[id number] DOB: 1950/05/24 DOA: 06/20/2023 PCP: Mordechai April, DO   Date of Service: the patient was seen and examined on 06/21/2023  Brief Narrative:  73 y.o. 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  Upon evaluation in the emergency department patient was found to have multiple SIRS criteria concerning for sepsis with chest x-ray revealing bibasilar pneumonia.  Patient was started on broad-spectrum intravenous antibiotics as well as supplemental oxygen and the hospitalist group was then called to assess the patient for admission to the hospital.   Assessment & Plan Pneumonia of both lower lobes due to infectious organism Patient currently on Unasyn and azithromycin Adding intravenous vancomycin  Obtaining MRSA PCR, respiratory viral panel, strep pneumonia urinary antigen, Legionella urinary antigen  Following blood cultures  Acute provide patient with supplemental oxygen  SIRS improving  Sepsis due to Streptococcus pneumoniae with acute hypoxic respiratory failure (HCC) Supplemental oxygen for associated hypoxia Remainder of workup and treatment as above. 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 status and nonverbal.  PEG tube in situ.  Nutrition evaluation sought for initiating tube feed.  Will give IV hydration tonight. Renal cyst Incidental finding on CT  outpatient eval with ultrasound if desired Protein-calorie malnutrition, severe Status post recent placement of PEG tube in January at [facility] Nutrition consulted, their input is appreciated Patient follows with Dr. Annamarie Barrier with  neurology at [facility]     Subjective:  Patient nonverbal which is his baseline.  Family at the bedside states the patient is less dyspneic than yesterday with improving cough and appears more comfortable.  Physical Exam:  Vitals:   06/21/23 1355 06/21/23 1524 06/21/23 2041 06/21/23 2042  BP: 127/65  (!) 118/57   Pulse: (!) 107  (!) 108 (!) 108  Resp:   19 20  Temp: 99.8 F (37.7 C)  99.9 F (37.7 C)   TempSrc:      SpO2: 93% 95% 90% 91%  Weight:      Height:        Constitutional: Awake, alert, nonverbal.  Patient is in mild respiratory distress. Skin: no rashes, no lesions, poor skin turgor noted. Eyes: Pupils are equally reactive to light.  No evidence of scleral icterus or conjunctival pallor.  ENMT: Dry mucous membranes noted with notable dry mucous in the oropharynx.  No obvious thrush.   Respiratory: Shallow breath sounds, notable rales in the bilateral lower fields.  No significant wheezing.  Patient is tachypneic without accessory muscle use. Cardiovascular: Tachycardic rate with regular rhythm.  No murmurs / rubs / gallops. No extremity edema. 2+ pedal pulses. No carotid bruits.  Abdomen: Abdomen is soft and nontender.  No evidence of intra-abdominal masses.  Positive bowel sounds noted in all quadrants.   Musculoskeletal: No joint deformity upper and lower extremities.  Poor muscle tone noted.   Data Reviewed:  I have personally reviewed and interpreted labs, imaging.  Significant findings are   CBC: Recent Labs  Lab 06/20/23 1503 06/21/23 0350  WBC 12.7* 10.1  NEUTROABS 11.5*  --   HGB 14.2 11.7*  HCT 43.7 35.7*  MCV 96.3 95.2  PLT 227 196  Basic Metabolic Panel: Recent Labs  Lab 06/20/23 1503 06/21/23 0350 06/21/23 1634  NA 139 140  --   K 4.5 3.8  --   CL 104 107  --   CO2 24 25  --   GLUCOSE 183* 135*  --   BUN 33* 23  --   CREATININE 0.76 0.69  --   CALCIUM 8.9 8.2*  --   MG  --   --  1.9  PHOS  --   --  2.7   GFR: Estimated  Creatinine Clearance: 65.3 mL/min (by C-G formula based on SCr of 0.69 mg/dL). Liver Function Tests: Recent Labs  Lab 06/20/23 1503  AST 25  ALT 25  ALKPHOS 67  BILITOT 0.8  PROT 7.5  ALBUMIN  3.4*    Coagulation Profile: Recent Labs  Lab 06/20/23 1503 06/21/23 0350  INR 1.2 1.3*   Telemetry: Personally reviewed, sinus tachycardia with heart rate of 108 bpm.  Code Status:  Full code.  Code status decision has been confirmed with: wife at the bedside Family Communication: Wife is at the bedside and has been updated on plan of care.   Severity of Illness:  The appropriate patient status for this patient is INPATIENT. Inpatient status is judged to be reasonable and necessary in order to provide the required intensity of service to ensure the patient's safety. The patient's presenting symptoms, physical exam findings, and initial radiographic and laboratory data in the context of their chronic comorbidities is felt to place them at high risk for further clinical deterioration. Furthermore, it is not anticipated that the patient will be medically stable for discharge from the hospital within 2 midnights of admission.   * I certify that at the point of admission it is my clinical judgment that the patient will require inpatient hospital care spanning beyond 2 midnights from the point of admission due to high intensity of service, high risk for further deterioration and high frequency of surveillance required.*  Time spent:  57 minutes  Author:  True Fuss MD  06/21/2023 10:51 PM

## 2023-06-21 NOTE — Progress Notes (Signed)
 Pharmacy Antibiotic Note  Guy Payne is a 73 y.o. male admitted on 06/20/2023 with pneumonia.  Medical history significant of ALS, tongue palsy, and inability to talk/speak. Patient is n.p.o. and gets PEG tube feeding. 4/29 Chest x-ray shows pneumonia bibasilar. Pharmacy has been consulted for vancomycin dosing.  In ED, vancomycin 1000 mg IV x 1 administered  Plan: Continue vancomycin at 1250 mg IV every 24 hours (Goal AUC 400-550, eAUC 519.7, SCr used: rounded up to 0.8) Monitor clinical progress, renal function, vancomycin levels as indicated F/U C&S, abx deescalation / LOT  Height: 5\' 8"  (172.7 cm) Weight: 57 kg (125 lb 10.6 oz) IBW/kg (Calculated) : 68.4  Temp (24hrs), Avg:98.8 F (37.1 C), Min:98.3 F (36.8 C), Max:99.8 F (37.7 C)  Recent Labs  Lab 06/20/23 1503 06/20/23 1602 06/20/23 1713 06/21/23 0350 06/21/23 1634  WBC 12.7*  --   --  10.1  --   CREATININE 0.76  --   --  0.69  --   LATICACIDVEN  --  2.9* 2.0*  --  1.6    Estimated Creatinine Clearance: 65.3 mL/min (by C-G formula based on SCr of 0.69 mg/dL).    No Known Allergies  Antimicrobials this admission: 4/29 cefepime & Flagyl x 1 4/29-5/1 azithromycin 4/30 Unasyn >>  4/30 vancomycin >>   Microbiology results: 4/29 BCx: pending 4/30 MRSA PCR: sent   Thank you for allowing pharmacy to be a part of this patient's care.  Alfredo Inch, PharmD, BCPS Clinical Pharmacist [place] 06/21/2023 6:02 PM

## 2023-06-21 NOTE — Hospital Course (Signed)
 73 y.o. male with a PMH of ALS complicated by inability to phonate, HTN, severe protein calorie malnutrition and dysphagia status post PEG tube placement  (03/07/2023) who presented to [facility] emergency department with increasing cough and recurrent fevers in the home setting.  Upon evaluation in the emergency department patient was found to have multiple SIRS criteria concerning for sepsis with chest x-ray revealing bibasilar pneumonia.  Patient was started on broad-spectrum intravenous antibiotics as well as supplemental oxygen and the hospitalist group was then called to assess the patient for admission to the hospital.

## 2023-06-21 NOTE — Assessment & Plan Note (Signed)
 Status post recent placement of PEG tube in January at [facility] Nutrition consulted, their input is appreciated Patient follows with Dr. Annamarie Barrier with neurology at [facility]

## 2023-06-21 NOTE — Progress Notes (Signed)
 Mobility Specialist - Progress Note   06/21/23 1216  Oxygen Therapy  SpO2 (!) 86 %  O2 Device Nasal Cannula  O2 Flow Rate (L/min) 4 L/min  Patient Activity (if Appropriate) Ambulating  Mobility  Activity Ambulated with assistance in hallway  Level of Assistance Standby assist, set-up cues, supervision of patient - no hands on  Assistive Device Front wheel walker  Distance Ambulated (ft) 120 ft  Activity Response Tolerated well  Mobility Referral Yes  Mobility visit 1 Mobility  Mobility Specialist Start Time (ACUTE ONLY) 1150  Mobility Specialist Stop Time (ACUTE ONLY) 1213  Mobility Specialist Time Calculation (min) (ACUTE ONLY) 23 min   Pt received in bed and agreeable to mobility. During ambulation, pt desat to 86%. Despite suggesting a rest break to allow SpO2 to come back up, pt still eager to ambulate further. Directed pt back to room to allow SpO2 to come back up to 89%. No complaints during session. Pt to bed after session with all needs met. RN made aware of session.   [facility]

## 2023-06-21 NOTE — TOC Initial Note (Signed)
 Transition of Care [facility]) - Initial/Assessment Note   Patient Details  Name: Guy Payne MRN: [id number] Date of Birth: 28-Jun-1950  Transition of Care [facility]) CM/SW Contact:    Zenon Hilda, LCSW Phone Number: 06/21/2023, 9:46 AM  Clinical Narrative: Patient is from home with spouse. Patient is nonverbal and has a g-tube. Patient is not on oxygen at baseline, but is currently on 4L/min. TOC to follow for possible discharge needs.  Expected Discharge Plan: Home/Self Care Barriers to Discharge: Continued Medical Work up  Patient Goals and CMS Choice Patient states their goals for this hospitalization and ongoing recovery are:: Patient is nonverbal  Expected Discharge Plan and Services In-house Referral: Clinical Social Work Living arrangements for the past 2 months: Single Family Home  Prior Living Arrangements/Services Living arrangements for the past 2 months: Single Family Home Lives with:: Spouse Patient language and need for interpreter reviewed:: Yes Do you feel safe going back to the place where you live?: Yes      Need for Family Participation in Patient Care: Yes (Comment) (Patient is nonverbal.) Care giver support system in place?: Yes (comment) Criminal Activity/Legal Involvement Pertinent to Current Situation/Hospitalization: No - Comment as needed  Activities of Daily Living ADL Screening (condition at time of admission) Independently performs ADLs?: Yes (appropriate for developmental age) Is the patient deaf or have difficulty hearing?: No Does the patient have difficulty seeing, even when wearing glasses/contacts?: No Does the patient have difficulty concentrating, remembering, or making decisions?: No  Emotional Assessment Alcohol / Substance Use: Not Applicable Psych Involvement: No (comment)  Admission diagnosis:  Pneumonia [J18.9] Multifocal pneumonia [J18.9] Sepsis with acute hypoxic respiratory failure without septic shock, due to unspecified organism  (HCC) [A41.9, R65.20, J96.01] Patient Active Problem List   Diagnosis Date Noted   Pneumonia 06/20/2023   Renal cyst 06/20/2023   ALS (amyotrophic lateral sclerosis) (HCC) 06/20/2023   Periodic limb movements of sleep 05/14/2015   Amnestic MCI, 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 [place] [address] Phone: [phone] Fax: [phone]  [facility] - [place], [place] - [address] [place] [address] Phone: [phone] Fax: [phone]  Social Drivers of Health (SDOH) Social History: SDOH Screenings   Food Insecurity: No Food Insecurity (06/20/2023)  Housing: Low Risk  (06/20/2023)  Transportation Needs: No Transportation Needs (06/20/2023)  Utilities: Not At Risk (06/20/2023)  Social Connections: Unknown (06/20/2023)  Tobacco Use: Low Risk  (06/20/2023)   SDOH Interventions:    Readmission Risk Interventions     No data to display

## 2023-06-21 NOTE — Assessment & Plan Note (Signed)
 Supplemental oxygen for associated hypoxia Remainder of workup and treatment as above.

## 2023-06-22 DIAGNOSIS — J189 Pneumonia, unspecified organism: Secondary | ICD-10-CM | POA: Diagnosis not present

## 2023-06-22 DIAGNOSIS — A419 Sepsis, unspecified organism: Secondary | ICD-10-CM | POA: Diagnosis not present

## 2023-06-22 DIAGNOSIS — J9601 Acute respiratory failure with hypoxia: Secondary | ICD-10-CM | POA: Diagnosis not present

## 2023-06-22 DIAGNOSIS — G1221 Amyotrophic lateral sclerosis: Secondary | ICD-10-CM | POA: Diagnosis not present

## 2023-06-22 DIAGNOSIS — E43 Unspecified severe protein-calorie malnutrition: Secondary | ICD-10-CM | POA: Diagnosis not present

## 2023-06-22 DIAGNOSIS — A403 Sepsis due to Streptococcus pneumoniae: Secondary | ICD-10-CM | POA: Diagnosis not present

## 2023-06-22 LAB — CBC WITH DIFFERENTIAL/PLATELET
Abs Immature Granulocytes: 0.1 10*3/uL — ABNORMAL HIGH (ref 0.00–0.07)
Basophils Absolute: 0 10*3/uL (ref 0.0–0.1)
Basophils Relative: 0 %
Eosinophils Absolute: 0.1 10*3/uL (ref 0.0–0.5)
Eosinophils Relative: 0 %
HCT: 34.3 % — ABNORMAL LOW (ref 39.0–52.0)
Hemoglobin: 11.3 g/dL — ABNORMAL LOW (ref 13.0–17.0)
Immature Granulocytes: 1 %
Lymphocytes Relative: 6 %
Lymphs Abs: 0.7 10*3/uL (ref 0.7–4.0)
MCH: 31.9 pg (ref 26.0–34.0)
MCHC: 32.9 g/dL (ref 30.0–36.0)
MCV: 96.9 fL (ref 80.0–100.0)
Monocytes Absolute: 0.8 10*3/uL (ref 0.1–1.0)
Monocytes Relative: 8 %
Neutro Abs: 9.5 10*3/uL — ABNORMAL HIGH (ref 1.7–7.7)
Neutrophils Relative %: 85 %
Platelets: 192 10*3/uL (ref 150–400)
RBC: 3.54 MIL/uL — ABNORMAL LOW (ref 4.22–5.81)
RDW: 12.9 % (ref 11.5–15.5)
WBC: 11.3 10*3/uL — ABNORMAL HIGH (ref 4.0–10.5)
nRBC: 0 % (ref 0.0–0.2)

## 2023-06-22 LAB — COMPREHENSIVE METABOLIC PANEL WITH GFR
ALT: 26 U/L (ref 0–44)
AST: 26 U/L (ref 15–41)
Albumin: 2.3 g/dL — ABNORMAL LOW (ref 3.5–5.0)
Alkaline Phosphatase: 56 U/L (ref 38–126)
Anion gap: 9 (ref 5–15)
BUN: 15 mg/dL (ref 8–23)
CO2: 26 mmol/L (ref 22–32)
Calcium: 8 mg/dL — ABNORMAL LOW (ref 8.9–10.3)
Chloride: 102 mmol/L (ref 98–111)
Creatinine, Ser: 0.68 mg/dL (ref 0.61–1.24)
GFR, Estimated: >60 mL/min (ref >60)
Glucose, Bld: 129 mg/dL — ABNORMAL HIGH (ref 70–99)
Potassium: 3.4 mmol/L — ABNORMAL LOW (ref 3.5–5.1)
Sodium: 137 mmol/L (ref 135–145)
Total Bilirubin: 0.7 mg/dL (ref 0.0–1.2)
Total Protein: 5.5 g/dL — ABNORMAL LOW (ref 6.5–8.1)

## 2023-06-22 LAB — STREP PNEUMONIAE URINARY ANTIGEN: Strep Pneumo Urinary Antigen: NEGATIVE

## 2023-06-22 LAB — PHOSPHORUS
Phosphorus: 3.5 mg/dL (ref 2.5–4.6)
Phosphorus: 3.7 mg/dL (ref 2.5–4.6)

## 2023-06-22 LAB — MAGNESIUM
Magnesium: 1.7 mg/dL (ref 1.7–2.4)
Magnesium: 1.8 mg/dL (ref 1.7–2.4)

## 2023-06-22 LAB — C-REACTIVE PROTEIN: CRP: 21 mg/dL — ABNORMAL HIGH (ref <1.0)

## 2023-06-22 LAB — GLUCOSE, CAPILLARY
Glucose-Capillary: 106 mg/dL — ABNORMAL HIGH (ref 70–99)
Glucose-Capillary: 110 mg/dL — ABNORMAL HIGH (ref 70–99)
Glucose-Capillary: 116 mg/dL — ABNORMAL HIGH (ref 70–99)
Glucose-Capillary: 121 mg/dL — ABNORMAL HIGH (ref 70–99)
Glucose-Capillary: 122 mg/dL — ABNORMAL HIGH (ref 70–99)
Glucose-Capillary: 131 mg/dL — ABNORMAL HIGH (ref 70–99)

## 2023-06-22 LAB — PROCALCITONIN: Procalcitonin: 2.82 ng/mL

## 2023-06-22 MED ORDER — SODIUM CHLORIDE 0.9 % IN NEBU
3.0000 mL | INHALATION_SOLUTION | Freq: Three times a day (TID) | RESPIRATORY_TRACT | Status: DC
  Administered 2023-06-22 – 2023-06-23 (×5): 3 mL via RESPIRATORY_TRACT
  Filled 2023-06-22 (×7): qty 3

## 2023-06-22 NOTE — Assessment & Plan Note (Signed)
 Incidental finding on CT  outpatient eval with ultrasound if desired

## 2023-06-22 NOTE — Assessment & Plan Note (Signed)
 Supplemental oxygen for associated hypoxia Remainder of workup and treatment as above.

## 2023-06-22 NOTE — Assessment & Plan Note (Addendum)
 Pulmonary consulted, their input is appreciated.  Continue broad-spectrum intravenous antibiotics with Unasyn  and azithromycin  MRSA PCR positive Rhinovirus/enterovirus found to be positive on respiratory virus PCR testing Weaning supplemental oxygen as able Chest physiotherapy, vest therapy Following blood cultures  SIRS improving

## 2023-06-22 NOTE — TOC Progression Note (Signed)
 Transition of Care [facility]) - Progression Note   Patient Details  Name: Guy Payne MRN: [id number] Date of Birth: May 17, 1950  Transition of Care [facility]) CM/SW Contact  Zenon Hilda, LCSW Phone Number: 06/22/2023, 1:45 PM  Clinical Narrative: [facility] consulted for a possible home Trilogy for patient. CSW made referral to [facility] with [employer] as patient has [facility]. Per Harriet Limber, patient will need a PFT or bedside spirometry in order to have the required documentation for insurance to cover the Trilogy. CSW updated hospitalist. TOC to follow.   Expected Discharge Plan: Home/Self Care Barriers to Discharge: Continued Medical Work up  Expected Discharge Plan and Services In-house Referral: Clinical Social Work Living arrangements for the past 2 months: Single Family Home  Social Determinants of Health (SDOH) Interventions SDOH Screenings   Food Insecurity: No Food Insecurity (06/20/2023)  Housing: Low Risk  (06/20/2023)  Transportation Needs: No Transportation Needs (06/20/2023)  Utilities: Not At Risk (06/20/2023)  Social Connections: Unknown (06/20/2023)  Tobacco Use: Low Risk  (06/20/2023)   Readmission Risk Interventions     No data to display

## 2023-06-22 NOTE — Consult Note (Signed)
 NAME:  Guy Payne, MRN:  [id number], DOB:  16-Aug-1950, LOS: 2 ADMISSION DATE:  06/20/2023, CONSULTATION DATE:  06/22/23 REFERRING MD:  Lanette Pipe, CHIEF COMPLAINT:  pneumonia   History of Present Illness:  Guy Payne is a 73 y.o. M with PMH significant for ALS and tongue palsy with PEG who presented to the ER with several days of productive cough and fever and was found to have basilar pneumonia with viral panel positive for rhinovirus.  He is followed at the ALS clinic through [facility] and spouse was inquiring whether pt meets criteria for home NIV   Pertinent  Medical History   has a past medical history of ALS (amyotrophic lateral sclerosis) (HCC), Fall (2021), MCI (mild cognitive impairment), Memory loss, Prostate disorder, and Restless leg.   Significant Hospital Events: Including procedures, antibiotic start and stop dates in addition to other pertinent events   4/30 admit with viral pneumonia   Interim History / Subjective:  Stable on [place]  Objective   Blood pressure 109/86, pulse 90, temperature 98.4 F (36.9 C), temperature source Oral, resp. rate (!) 23, height 5\' 8"  (1.727 m), weight 59.6 kg, SpO2 93%.        Intake/Output Summary (Last 24 hours) at 06/22/2023 0951 Last data filed at 06/22/2023 0100 Gross per 24 hour  Intake 0 ml  Output 200 ml  Net -200 ml   Filed Weights   06/20/23 1456 06/22/23 0500  Weight: 57 kg 59.6 kg   General:  thin, chronically ill-appearing M resting in bed in NAD HEENT: MM pink/moist, unable to move tongue Neuro: alert, chronically unable to speak, tongue palsy, able to write some, nodding and responding appropriately, seems oriented  CV: s1s2 rrr, no m/r/g PULM:  diminished in the bilateral bases without rhonchi or wheezing  GI: soft, bsx4 active  Extremities: warm/dry, no edema  Skin: no rashes or lesions   Resolved Hospital Problem list     Assessment & Plan:   Sepsis and acute hypoxic respiratory failure due to viral pneumonia  in the setting of ALS -saline nebs with chest PT and pulmonary toilet -have family bring cough assist from home if able  -continue empiric antibiotics -Pt did not meet criteria for NIV at home per last [facility] note, given his acute infection, it is difficult to assess if pt meets criteria for long term NIV while hospitalized   Best Practice (right click and "Reselect all SmartList Selections" daily)   Per primary  Labs   CBC: Recent Labs  Lab 06/20/23 1503 06/21/23 0350 06/22/23 0353  WBC 12.7* 10.1 11.3*  NEUTROABS 11.5*  --  9.5*  HGB 14.2 11.7* 11.3*  HCT 43.7 35.7* 34.3*  MCV 96.3 95.2 96.9  PLT 227 196 192    Basic Metabolic Panel: Recent Labs  Lab 06/20/23 1503 06/21/23 0350 06/21/23 1634 06/22/23 0353  NA 139 140  --  137  K 4.5 3.8  --  3.4*  CL 104 107  --  102  CO2 24 25  --  26  GLUCOSE 183* 135*  --  129*  BUN 33* 23  --  15  CREATININE 0.76 0.69  --  0.68  CALCIUM 8.9 8.2*  --  8.0*  MG  --   --  1.9 1.7  PHOS  --   --  2.7 3.5   GFR: Estimated Creatinine Clearance: 68.3 mL/min (by C-G formula based on SCr of 0.68 mg/dL). Recent Labs  Lab 06/20/23 1503 06/20/23 1602 06/20/23 1713 06/21/23  0350 06/21/23 1634 06/22/23 0353  PROCALCITON  --   --   --   --  4.13  --   WBC 12.7*  --   --  10.1  --  11.3*  LATICACIDVEN  --  2.9* 2.0*  --  1.6  --     Liver Function Tests: Recent Labs  Lab 06/20/23 1503 06/22/23 0353  AST 25 26  ALT 25 26  ALKPHOS 67 56  BILITOT 0.8 0.7  PROT 7.5 5.5*  ALBUMIN  3.4* 2.3*   No results for input(s): "LIPASE", "AMYLASE" in the last 168 hours. No results for input(s): "AMMONIA" in the last 168 hours.  ABG    Component Value Date/Time   HCO3 28.1 (H) 06/20/2023 1503   O2SAT 93.8 06/20/2023 1503     Coagulation Profile: Recent Labs  Lab 06/20/23 1503 06/21/23 0350  INR 1.2 1.3*    Cardiac Enzymes: No results for input(s): "CKTOTAL", "CKMB", "CKMBINDEX", "TROPONINI" in the last 168  hours.  HbA1C: No results found for: "HGBA1C"  CBG: Recent Labs  Lab 06/21/23 1559 06/21/23 2038 06/22/23 0019 06/22/23 0456 06/22/23 0758  GLUCAP 164* 139* 131* 116* 122*    Review of Systems:   Unable to obtain  Past Medical History:  He,  has a past medical history of ALS (amyotrophic lateral sclerosis) (HCC), Fall (2021), MCI (mild cognitive impairment), Memory loss, Prostate disorder, and Restless leg.   Surgical History:   Past Surgical History:  Procedure Laterality Date   genital surgery     was not specific   NO PAST SURGERIES     prostate repair for blockage       Social History:   reports that he has never smoked. He has never used smokeless tobacco. He reports that he does not currently use alcohol. He reports that he does not use drugs.   Family History:  His family history is negative for Dementia.   Allergies No Known Allergies   Home Medications  Prior to Admission medications   Medication Sig Start Date End Date Taking? Authorizing Provider  acetaminophen  (TYLENOL ) 500 MG tablet Take 500-1,000 mg by mouth every 6 (six) hours as needed for moderate pain (pain score 4-6).   Yes [provider]  donepezil  ([name] ) 10 MG tablet TAKE 1 TABLET BY MOUTH EVERYDAY AT BEDTIME 11/14/19  Yes Glory Larsen, MD  glycopyrrolate  (ROBINUL ) 1 MG tablet Take 1-2 mg by mouth See admin instructions. Take 1mg  (1 tablet) by mouth in the mornings and 2mg  (2 tablets) in the evening.   Yes [provider]  guaifenesin  (ROBITUSSIN) 100 MG/5ML syrup Take 200 mg by mouth 3 (three) times daily as needed for cough.   Yes [provider]  ipratropium-albuterol  (DUONEB) 0.5-2.5 (3) MG/3ML SOLN Take 3 mLs by nebulization every 6 (six) hours as needed. Patient taking differently: Take 3 mLs by nebulization 3 (three) times daily. 05/23/23  Yes Maire Scot, MD  loratadine (CLARITIN) 10 MG tablet Take 10 mg by mouth daily as needed for allergies.   Yes  [provider]  predniSONE  (DELTASONE ) 10 MG tablet Please take prednisone  40 mg x1 day, then 30 mg x1 day, then 20 mg x1 day, then 10 mg x1 day, and then 5 mg x1 day and stop Patient not taking: Reported on 06/21/2023 05/23/23   Maire Scot, MD  rosuvastatin (CRESTOR) 10 MG tablet 1 tablet every Mon, Wed, Friday Patient not taking: Reported on 06/21/2023 05/27/22   [provider]  scopolamine (  TRANSDERM-SCOP) 1 MG/3DAYS Place 1 patch onto the skin every 3 (three) days. Patient not taking: Reported on 06/21/2023    [provider]     Critical care time: n/a      Patt Boozer Saida Lonon, PA-C [facility] See Amion for pager If no response to pager , please call [phone] until 7pm After 7:00 pm call Elink  [phone]

## 2023-06-22 NOTE — Progress Notes (Signed)
 PROGRESS NOTE   Guy Payne  NWG:[id number] DOB: 29-May-1950 DOA: 06/20/2023 PCP: Mordechai April, DO   Date of Service: the patient was seen and examined on 06/22/2023  Brief Narrative:  73 y.o. male with a PMH of ALS complicated by inability to phonate, HTN, severe protein calorie malnutrition and dysphagia status post PEG tube placement  (03/07/2023) who presented to [facility] emergency department with increasing cough and recurrent fevers in the home setting.  Upon evaluation in the emergency department patient was found to have multiple SIRS criteria concerning for sepsis with chest x-ray revealing bibasilar pneumonia.  Patient was started on broad-spectrum intravenous antibiotics as well as supplemental oxygen and the hospitalist group was then called to assess the patient for admission to the hospital.   Assessment & Plan Pneumonia of both lower lobes due to infectious organism Pulmonary consulted, their input is appreciated.  Continue broad-spectrum intravenous antibiotics with Unasyn  and azithromycin  MRSA PCR positive Rhinovirus/enterovirus found to be positive on respiratory virus PCR testing Weaning supplemental oxygen as able Chest physiotherapy, vest therapy Following blood cultures  SIRS improving  Sepsis due to Streptococcus pneumoniae with acute hypoxic respiratory failure (HCC) Supplemental oxygen for associated hypoxia Remainder of workup and treatment as above. 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 status and nonverbal.   PEG tube in situ.  Nutrition following for management of tube feeds.   Patient follows with Dr. Annamarie Barrier with neurology at [facility] Renal cyst Incidental finding on CT  outpatient eval with ultrasound if desired Protein-calorie malnutrition, severe Status post recent placement of PEG tube in January at [facility] Nutrition consulted, their input is appreciated     Subjective:  Patient nonverbal which is his baseline.  Family at the bedside states the patient clinically appears more comfortable than yesterday.  Physical Exam:  Vitals:   06/21/23 2041 06/21/23 2042 06/22/23 0330 06/22/23 0500  BP: (!) 118/57  109/86   Pulse: (!) 108 (!) 108 90   Resp: 19 20 (!) 23   Temp: 99.9 F (37.7 C)  98.4 F (36.9 C)   TempSrc:   Oral   SpO2: 90% 91% 93%   Weight:    59.6 kg  Height:        Constitutional: Awake, alert, nonverbal.  Patient is not in any acute distress.   Skin: no rashes, no lesions, poor skin turgor noted. Eyes: Pupils are equally reactive to light.  No evidence of scleral icterus or conjunctival pallor.  ENMT: Dry mucous membranes noted with notable dry mucous in the oropharynx.  No obvious thrush.   Respiratory: Diminished breath sounds at the bases.  Scattered rhonchi bilaterally.  No significant wheezing.  Patient is no longer tachypneic.   Cardiovascular: Tachycardic rate with regular rhythm.  No murmurs / rubs / gallops. No extremity edema. 2+ pedal pulses. No carotid bruits.  Abdomen: Abdomen is soft and nontender.  No evidence of intra-abdominal masses.  Positive bowel sounds noted in all quadrants.   Musculoskeletal: No joint deformity upper and lower extremities.  Poor muscle tone noted.   Data Reviewed:  I have personally reviewed and interpreted labs, imaging.  Significant findings are   CBC: Recent Labs  Lab 06/20/23 1503 06/21/23 0350 06/22/23 0353  WBC 12.7* 10.1 11.3*  NEUTROABS 11.5*  --  9.5*  HGB 14.2 11.7* 11.3*  HCT 43.7 35.7* 34.3*  MCV 96.3  95.2 96.9  PLT 227 196 192   Basic Metabolic Panel: Recent Labs  Lab 06/20/23 1503 06/21/23 0350 06/21/23 1634 06/22/23 0353  NA 139 140  --  137  K 4.5 3.8  --  3.4*  CL 104 107  --  102  CO2 24 25  --  26  GLUCOSE 183* 135*  --  129*  BUN 33* 23  --  15  CREATININE 0.76 0.69  --   0.68  CALCIUM 8.9 8.2*  --  8.0*  MG  --   --  1.9 1.7  PHOS  --   --  2.7 3.5   GFR: Estimated Creatinine Clearance: 68.3 mL/min (by C-G formula based on SCr of 0.68 mg/dL). Liver Function Tests: Recent Labs  Lab 06/20/23 1503 06/22/23 0353  AST 25 26  ALT 25 26  ALKPHOS 67 56  BILITOT 0.8 0.7  PROT 7.5 5.5*  ALBUMIN  3.4* 2.3*    Coagulation Profile: Recent Labs  Lab 06/20/23 1503 06/21/23 0350  INR 1.2 1.3*     Code Status:  Full code.  Code status decision has been confirmed with: wife at the bedside Family Communication: Caretaker is at the bedside and has been updated on plan of care.   Severity of Illness:  The appropriate patient status for this patient is INPATIENT. Inpatient status is judged to be reasonable and necessary in order to provide the required intensity of service to ensure the patient's safety. The patient's presenting symptoms, physical exam findings, and initial radiographic and laboratory data in the context of their chronic comorbidities is felt to place them at high risk for further clinical deterioration. Furthermore, it is not anticipated that the patient will be medically stable for discharge from the hospital within 2 midnights of admission.   * I certify that at the point of admission it is my clinical judgment that the patient will require inpatient hospital care spanning beyond 2 midnights from the point of admission due to high intensity of service, high risk for further deterioration and high frequency of surveillance required.*  Time spent:  50 minutes  Author:  True Fuss MD  06/22/2023 9:10 AM

## 2023-06-22 NOTE — Assessment & Plan Note (Addendum)
 Status post recent placement of PEG tube in January at [facility] Nutrition consulted, their input is appreciated

## 2023-06-22 NOTE — Telephone Encounter (Signed)
 Patient was sent to the ER.

## 2023-06-22 NOTE — Assessment & Plan Note (Addendum)
 Complicated by tongue palsy and dysphagia  Patient is able to walk however  fall risk, tongue palsy with  n.p.o. status and nonverbal.   PEG tube in situ.  Nutrition following for management of tube feeds.   Patient follows with Dr. Annamarie Barrier with neurology at [facility]

## 2023-06-23 DIAGNOSIS — J189 Pneumonia, unspecified organism: Secondary | ICD-10-CM | POA: Diagnosis not present

## 2023-06-23 DIAGNOSIS — B348 Other viral infections of unspecified site: Secondary | ICD-10-CM | POA: Diagnosis not present

## 2023-06-23 DIAGNOSIS — G1221 Amyotrophic lateral sclerosis: Secondary | ICD-10-CM | POA: Diagnosis not present

## 2023-06-23 DIAGNOSIS — J9601 Acute respiratory failure with hypoxia: Secondary | ICD-10-CM | POA: Diagnosis not present

## 2023-06-23 DIAGNOSIS — E43 Unspecified severe protein-calorie malnutrition: Secondary | ICD-10-CM | POA: Diagnosis not present

## 2023-06-23 DIAGNOSIS — A403 Sepsis due to Streptococcus pneumoniae: Secondary | ICD-10-CM | POA: Diagnosis not present

## 2023-06-23 LAB — EXPECTORATED SPUTUM ASSESSMENT W GRAM STAIN, RFLX TO RESP C

## 2023-06-23 LAB — GLUCOSE, CAPILLARY
Glucose-Capillary: 105 mg/dL — ABNORMAL HIGH (ref 70–99)
Glucose-Capillary: 115 mg/dL — ABNORMAL HIGH (ref 70–99)
Glucose-Capillary: 115 mg/dL — ABNORMAL HIGH (ref 70–99)
Glucose-Capillary: 124 mg/dL — ABNORMAL HIGH (ref 70–99)
Glucose-Capillary: 126 mg/dL — ABNORMAL HIGH (ref 70–99)
Glucose-Capillary: 131 mg/dL — ABNORMAL HIGH (ref 70–99)
Glucose-Capillary: 146 mg/dL — ABNORMAL HIGH (ref 70–99)

## 2023-06-23 LAB — PHOSPHORUS: Phosphorus: 4.1 mg/dL (ref 2.5–4.6)

## 2023-06-23 LAB — MAGNESIUM: Magnesium: 1.8 mg/dL (ref 1.7–2.4)

## 2023-06-23 MED ORDER — SACCHAROMYCES BOULARDII 250 MG PO CAPS
250.0000 mg | ORAL_CAPSULE | Freq: Two times a day (BID) | ORAL | Status: DC
  Administered 2023-06-23 – 2023-06-27 (×8): 250 mg
  Filled 2023-06-23 (×8): qty 1

## 2023-06-23 MED ORDER — GUAIFENESIN-DM 100-10 MG/5ML PO SYRP
5.0000 mL | ORAL_SOLUTION | ORAL | Status: DC | PRN
Start: 2023-06-23 — End: 2023-06-27
  Administered 2023-06-23 – 2023-06-24 (×2): 5 mL
  Filled 2023-06-23 (×2): qty 10

## 2023-06-23 MED ORDER — VANCOMYCIN HCL 750 MG/150ML IV SOLN
750.0000 mg | Freq: Two times a day (BID) | INTRAVENOUS | Status: DC
  Administered 2023-06-23 – 2023-06-26 (×7): 750 mg via INTRAVENOUS
  Filled 2023-06-23 (×7): qty 150

## 2023-06-23 MED ORDER — SACCHAROMYCES BOULARDII 250 MG PO CAPS: 250.0000 mg | ORAL_CAPSULE | Freq: Two times a day (BID) | ORAL | Status: DC

## 2023-06-23 NOTE — Progress Notes (Signed)
 Patient NTS down the right nare at this time (post CPT with the vest). Small amount of clear, thick secretions were obtained. Minimal bleeding noted. Patient tolerated well. Vitals stable.

## 2023-06-23 NOTE — Progress Notes (Signed)
 Pharmacy Note:  Due to unavailability of vancomycin  1250 mg, regimen has been adjusted from 1250 mg IV q24h to 750 mg IV q12h.  On new regimen:  goal AUC 400-550, expected AUC 511.   SCr used:0.8.  Ginnie Laine, PharmD, BCPS Clinical Pharmacist 06/23/2023  12:05 PM

## 2023-06-23 NOTE — Assessment & Plan Note (Addendum)
 Complicated by tongue palsy and dysphagia  Patient is able to walk however with assistance fall risk, tongue palsy with  n.p.o. status and nonverbal.   PEG tube in situ.  Nutrition following for management of tube feeds.   Patient follows with Dr. Annamarie Barrier with neurology at [facility]

## 2023-06-23 NOTE — Assessment & Plan Note (Addendum)
  Remainder of workup and treatment as above.

## 2023-06-23 NOTE — Progress Notes (Signed)
 PROGRESS NOTE   Guy Payne  ZOX:[id number] DOB: [birth year] DOA: 06/20/2023 PCP: Mordechai April, DO   Date of Service: the patient was seen and examined on 06/23/2023  Brief Narrative:  73 y.o. male with a PMH of ALS complicated by inability to phonate, HTN, severe protein calorie malnutrition and dysphagia status post PEG tube placement  (03/07/2023) who presented to [facility] emergency department with increasing cough and recurrent fevers in the home setting.  Upon evaluation in the emergency department patient was found to have multiple SIRS criteria concerning for sepsis with chest x-ray revealing bibasilar pneumonia.  Patient was started on broad-spectrum intravenous antibiotics as well as supplemental oxygen and the hospitalist group was then called to assess the patient for admission to the hospital.   Assessment & Plan Pneumonia of both lower lobes due to infectious organism Continued slow clinical improvement  pulmonary consulted, their input is appreciated.  Continue broad-spectrum intravenous antibiotics with Unasyn  and azithromycin  MRSA PCR positive Rhinovirus/enterovirus found to be positive on respiratory virus PCR testing Weaning supplemental oxygen as able vest therapy or CoughAssist as able Supplemental oxygen for associated hypoxia, weaning as able. Following blood cultures that are currently without growth SIRS resolved  Sepsis due to Streptococcus pneumoniae with acute hypoxic respiratory failure (HCC)  Remainder of workup and treatment as above. 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 status and nonverbal.   PEG tube in situ.  Nutrition following for management of tube feeds.   Patient follows with Dr. Annamarie Barrier with neurology at [facility] Renal cyst Incidental finding on CT  outpatient eval with ultrasound if  desired Protein-calorie malnutrition, severe Status post recent placement of PEG tube in January at [facility] Nutrition consulted, their input is appreciated     Subjective:  Patient nonverbal which is his baseline.  Family at bedside frustrated that patient is exhibiting substantial cough, occasionally productive with yellow sputum.  Patient notions that he is feeling better with nonverbal cues.  Physical Exam:  Vitals:   06/23/23 0428 06/23/23 0458 06/23/23 0945 06/23/23 1000  BP: 96/61     Pulse: 78  82   Resp: 16  20   Temp: 97.7 F (36.5 C)     TempSrc: Oral     SpO2: 96%   95%  Weight:  58.7 kg    Height:        Constitutional: Awake, alert, nonverbal.  Patient is not in any acute distress.   Skin: no rashes, no lesions, poor skin turgor noted. Eyes: Pupils are equally reactive to light.  No evidence of scleral icterus or conjunctival pallor.  ENMT: Dry mucous membranes noted with notable dry mucous in the oropharynx.  No obvious thrush.   Respiratory: Diminished breath sounds at the bases.  Scattered rhonchi bilaterally.  No significant wheezing.  Patient is no longer tachypneic.  Improved air movement compared to yesterday. Cardiovascular: Regular rate and rhythm.  No murmurs / rubs / gallops. No extremity edema. 2+ pedal pulses. No carotid bruits.  Abdomen: Abdomen is soft and nontender.  No evidence of intra-abdominal masses.  Positive bowel sounds noted in all quadrants.   Musculoskeletal: No joint deformity upper and lower extremities.  Poor muscle tone noted.   Data Reviewed:  I have personally reviewed and interpreted labs, imaging.  Significant findings are   CBC: Recent Labs  Lab 06/20/23 1503 06/21/23 0350 06/22/23  0353  WBC 12.7* 10.1 11.3*  NEUTROABS 11.5*  --  9.5*  HGB 14.2 11.7* 11.3*  HCT 43.7 35.7* 34.3*  MCV 96.3 95.2 96.9  PLT 227 196 192   Basic Metabolic Panel: Recent Labs  Lab 06/20/23 1503 06/21/23 0350 06/21/23 1634  06/22/23 0353 06/22/23 1726 06/23/23 0332  NA 139 140  --  137  --   --   K 4.5 3.8  --  3.4*  --   --   CL 104 107  --  102  --   --   CO2 24 25  --  26  --   --   GLUCOSE 183* 135*  --  129*  --   --   BUN 33* 23  --  15  --   --   CREATININE 0.76 0.69  --  0.68  --   --   CALCIUM 8.9 8.2*  --  8.0*  --   --   MG  --   --  1.9 1.7 1.8 1.8  PHOS  --   --  2.7 3.5 3.7 4.1   GFR: Estimated Creatinine Clearance: 67.3 mL/min (by C-G formula based on SCr of 0.68 mg/dL). Liver Function Tests: Recent Labs  Lab 06/20/23 1503 06/22/23 0353  AST 25 26  ALT 25 26  ALKPHOS 67 56  BILITOT 0.8 0.7  PROT 7.5 5.5*  ALBUMIN  3.4* 2.3*    Coagulation Profile: Recent Labs  Lab 06/20/23 1503 06/21/23 0350  INR 1.2 1.3*     Code Status:  Full code.  Code status decision has been confirmed with: wife at the bedside Family Communication: Wife is at the bedside and has been updated on plan of care.   Severity of Illness:  The appropriate patient status for this patient is INPATIENT. Inpatient status is judged to be reasonable and necessary in order to provide the required intensity of service to ensure the patient's safety. The patient's presenting symptoms, physical exam findings, and initial radiographic and laboratory data in the context of their chronic comorbidities is felt to place them at high risk for further clinical deterioration. Furthermore, it is not anticipated that the patient will be medically stable for discharge from the hospital within 2 midnights of admission.   * I certify that at the point of admission it is my clinical judgment that the patient will require inpatient hospital care spanning beyond 2 midnights from the point of admission due to high intensity of service, high risk for further deterioration and high frequency of surveillance required.*  Time spent:  51 minutes  Author:  True Fuss MD  06/23/2023 12:18 PM

## 2023-06-23 NOTE — Assessment & Plan Note (Signed)
 Status post recent placement of PEG tube in January at [facility] Nutrition consulted, their input is appreciated

## 2023-06-23 NOTE — Assessment & Plan Note (Signed)
 Incidental finding on CT  outpatient eval with ultrasound if desired

## 2023-06-23 NOTE — Assessment & Plan Note (Addendum)
 Continued slow clinical improvement  pulmonary consulted, their input is appreciated.  Continue broad-spectrum intravenous antibiotics with Unasyn  and azithromycin  MRSA PCR positive Rhinovirus/enterovirus found to be positive on respiratory virus PCR testing Weaning supplemental oxygen as able vest therapy or CoughAssist as able Supplemental oxygen for associated hypoxia, weaning as able. Following blood cultures that are currently without growth SIRS resolved

## 2023-06-23 NOTE — TOC Progression Note (Signed)
 Transition of Care [facility]) - Progression Note   Patient Details  Name: Guy Payne MRN: [id number] Date of Birth: 02-02-1951  Transition of Care [facility]) CM/SW Contact  Zenon Hilda, LCSW Phone Number: 06/23/2023, 10:52 AM  Clinical Narrative: Patient will need to be evaluated for a Tr[facility] outpatient with spirometry per critical care. CSW updated Mitch with [employer]. [facility] to follow up outpatient. Patient currently requiring 5-6L/min.  Expected Discharge Plan: Home/Self Care Barriers to Discharge: Continued Medical Work up  Expected Discharge Plan and Services In-house Referral: Clinical Social Work Living arrangements for the past 2 months: Single Family Home  Social Determinants of Health (SDOH) Interventions SDOH Screenings   Food Insecurity: No Food Insecurity (06/20/2023)  Housing: Low Risk  (06/20/2023)  Transportation Needs: No Transportation Needs (06/20/2023)  Utilities: Not At Risk (06/20/2023)  Social Connections: Unknown (06/20/2023)  Tobacco Use: Low Risk  (06/20/2023)   Readmission Risk Interventions     No data to display

## 2023-06-23 NOTE — Progress Notes (Signed)
 NAME:  OHENE PETROSYAN, MRN:  [id number], DOB:  Aug 16, 1950, LOS: 3 ADMISSION DATE:  06/20/2023, CONSULTATION DATE:  06/23/23 REFERRING MD:  Lanette Pipe, CHIEF COMPLAINT:  pneumonia   History of Present Illness:  Guy Payne is a 73 y.o. M with PMH significant for ALS and tongue palsy with PEG who presented to the ER with several days of productive cough and fever and was found to have basilar pneumonia with viral panel positive for rhinovirus.  He is followed at the ALS clinic through [facility] and spouse was inquiring whether pt meets criteria for home NIV.  Pertinent  Medical History   has a past medical history of ALS (amyotrophic lateral sclerosis) (HCC), Fall (2021), MCI (mild cognitive impairment), Memory loss, Prostate disorder, and Restless leg.   Significant Hospital Events: Including procedures, antibiotic start and stop dates in addition to other pertinent events   4/30 admit with viral pneumonia   Interim History / Subjective:  Wife reports he has had a dry cough that is bothersome.    Objective   Blood pressure 96/61, pulse 78, temperature 97.7 F (36.5 C), temperature source Oral, resp. rate 16, height 5\' 8"  (1.727 m), weight 58.7 kg, SpO2 96%.        Intake/Output Summary (Last 24 hours) at 06/23/2023 0853 Last data filed at 06/23/2023 0315 Gross per 24 hour  Intake 2542.33 ml  Output 200 ml  Net 2342.33 ml   Filed Weights   06/20/23 1456 06/22/23 0500 06/23/23 0458  Weight: 57 kg 59.6 kg 58.7 kg   General: chronically ill appearing man sititng up in the chair in NAD HEENT: [place]/AT, eyes anicteric Neuro: awake, alert, moving all extremities, dysarthric speech CV: S1S2, RRR PULM: faint basilar rhales, breathing comfortably on [place], no observed coughing GI: soft, NT Extremities: no edema  Skin: warm, dry, no rashes  4/29 VBG reviewed- 7.5/36/59/28  Resolved Hospital Problem list     Assessment & Plan:   Sepsis and acute hypoxic respiratory failure due to viral  pneumonia in the setting of ALS -encouraged wife that coughing is normal and is helpful for moving secretions, even if they are not high in volume -agree with plans for OOB mobility -con't cough assist per home regimen; can increase if having more secretions -con't CPT and hypertonic saline to ensure he has adequate assistance for pulmonary clearance in case his volume of secretions increases -continue empiric antibiotics -Pt did not meet criteria for NIV at home per last [facility] note, given his acute infection, it is difficult to assess if pt meets criteria for long term NIV while hospitalized. No hypercapnia on admission blood gas, suggesting he does not need home NIV at present. Will need ongoing close follow up with ALS clinic as an outpatient. Wife asked again about NIV-- we reviewed the different criteria to get this and the importance of regular follow up with his clinic for surveillance PFTs.  Best Practice (right click and "Reselect all SmartList Selections" daily)   Per primary  Labs   CBC: Recent Labs  Lab 06/20/23 1503 06/21/23 0350 06/22/23 0353  WBC 12.7* 10.1 11.3*  NEUTROABS 11.5*  --  9.5*  HGB 14.2 11.7* 11.3*  HCT 43.7 35.7* 34.3*  MCV 96.3 95.2 96.9  PLT 227 196 192    Basic Metabolic Panel: Recent Labs  Lab 06/20/23 1503 06/21/23 0350 06/21/23 1634 06/22/23 0353 06/22/23 1726 06/23/23 0332  NA 139 140  --  137  --   --   K 4.5 3.8  --  3.4*  --   --   CL 104 107  --  102  --   --   CO2 24 25  --  26  --   --   GLUCOSE 183* 135*  --  129*  --   --   BUN 33* 23  --  15  --   --   CREATININE 0.76 0.69  --  0.68  --   --   CALCIUM 8.9 8.2*  --  8.0*  --   --   MG  --   --  1.9 1.7 1.8 1.8  PHOS  --   --  2.7 3.5 3.7 4.1   GFR: Estimated Creatinine Clearance: 67.3 mL/min (by C-G formula based on SCr of 0.68 mg/dL). Recent Labs  Lab 06/20/23 1503 06/20/23 1602 06/20/23 1713 06/21/23 0350 06/21/23 1634 06/22/23 0353 06/22/23 1726  PROCALCITON  --    --   --   --  4.13  --  2.82  WBC 12.7*  --   --  10.1  --  11.3*  --   LATICACIDVEN  --  2.9* 2.0*  --  1.6  --   --     Critical care time: n/a     Joesph Mussel, DO 06/23/23 11:19 AM [facility]  For contact information, see Amion. If no response to pager, please call PCCM consult pager. After hours, 7PM- 7AM, please call Elink.

## 2023-06-24 ENCOUNTER — Inpatient Hospital Stay (HOSPITAL_COMMUNITY)

## 2023-06-24 DIAGNOSIS — A403 Sepsis due to Streptococcus pneumoniae: Secondary | ICD-10-CM | POA: Diagnosis not present

## 2023-06-24 DIAGNOSIS — E43 Unspecified severe protein-calorie malnutrition: Secondary | ICD-10-CM | POA: Diagnosis not present

## 2023-06-24 DIAGNOSIS — J189 Pneumonia, unspecified organism: Secondary | ICD-10-CM | POA: Diagnosis not present

## 2023-06-24 DIAGNOSIS — G1221 Amyotrophic lateral sclerosis: Secondary | ICD-10-CM | POA: Diagnosis not present

## 2023-06-24 LAB — CBC WITH DIFFERENTIAL/PLATELET
Abs Immature Granulocytes: 0.2 10*3/uL — ABNORMAL HIGH (ref 0.00–0.07)
Basophils Absolute: 0.1 10*3/uL (ref 0.0–0.1)
Basophils Relative: 0 %
Eosinophils Absolute: 0.5 10*3/uL (ref 0.0–0.5)
Eosinophils Relative: 4 %
HCT: 37.4 % — ABNORMAL LOW (ref 39.0–52.0)
Hemoglobin: 12.3 g/dL — ABNORMAL LOW (ref 13.0–17.0)
Immature Granulocytes: 2 %
Lymphocytes Relative: 7 %
Lymphs Abs: 0.8 10*3/uL (ref 0.7–4.0)
MCH: 30.8 pg (ref 26.0–34.0)
MCHC: 32.9 g/dL (ref 30.0–36.0)
MCV: 93.7 fL (ref 80.0–100.0)
Monocytes Absolute: 0.9 10*3/uL (ref 0.1–1.0)
Monocytes Relative: 8 %
Neutro Abs: 9.3 10*3/uL — ABNORMAL HIGH (ref 1.7–7.7)
Neutrophils Relative %: 79 %
Platelets: 257 10*3/uL (ref 150–400)
RBC: 3.99 MIL/uL — ABNORMAL LOW (ref 4.22–5.81)
RDW: 12.6 % (ref 11.5–15.5)
WBC: 11.8 10*3/uL — ABNORMAL HIGH (ref 4.0–10.5)
nRBC: 0 % (ref 0.0–0.2)

## 2023-06-24 LAB — GLUCOSE, CAPILLARY
Glucose-Capillary: 130 mg/dL — ABNORMAL HIGH (ref 70–99)
Glucose-Capillary: 110 mg/dL — ABNORMAL HIGH (ref 70–99)
Glucose-Capillary: 99 mg/dL (ref 70–99)
Glucose-Capillary: 105 mg/dL — ABNORMAL HIGH (ref 70–99)
Glucose-Capillary: 106 mg/dL — ABNORMAL HIGH (ref 70–99)

## 2023-06-24 LAB — COMPREHENSIVE METABOLIC PANEL WITH GFR
ALT: 40 U/L (ref 0–44)
AST: 33 U/L (ref 15–41)
Albumin: 2.4 g/dL — ABNORMAL LOW (ref 3.5–5.0)
Alkaline Phosphatase: 68 U/L (ref 38–126)
Anion gap: 10 (ref 5–15)
BUN: 20 mg/dL (ref 8–23)
CO2: 23 mmol/L (ref 22–32)
Calcium: 8.4 mg/dL — ABNORMAL LOW (ref 8.9–10.3)
Chloride: 107 mmol/L (ref 98–111)
Creatinine, Ser: 0.57 mg/dL — ABNORMAL LOW (ref 0.61–1.24)
GFR, Estimated: >60 mL/min (ref >60)
Glucose, Bld: 120 mg/dL — ABNORMAL HIGH (ref 70–99)
Potassium: 3.9 mmol/L (ref 3.5–5.1)
Sodium: 140 mmol/L (ref 135–145)
Total Bilirubin: 0.6 mg/dL (ref 0.0–1.2)
Total Protein: 6 g/dL — ABNORMAL LOW (ref 6.5–8.1)

## 2023-06-24 LAB — PHOSPHORUS: Phosphorus: 3.6 mg/dL (ref 2.5–4.6)

## 2023-06-24 LAB — BRAIN NATRIURETIC PEPTIDE: B Natriuretic Peptide: 72 pg/mL (ref 0.0–100.0)

## 2023-06-24 LAB — PROCALCITONIN: Procalcitonin: 0.97 ng/mL

## 2023-06-24 LAB — MAGNESIUM: Magnesium: 1.9 mg/dL (ref 1.7–2.4)

## 2023-06-24 MED ORDER — SODIUM CHLORIDE 0.9 % IN NEBU
3.0000 mL | INHALATION_SOLUTION | Freq: Three times a day (TID) | RESPIRATORY_TRACT | Status: DC
  Administered 2023-06-24 – 2023-06-27 (×10): 3 mL via RESPIRATORY_TRACT
  Filled 2023-06-24 (×12): qty 3

## 2023-06-24 MED ORDER — CARMEX CLASSIC LIP BALM EX OINT
TOPICAL_OINTMENT | CUTANEOUS | Status: DC | PRN
Start: 2023-06-24 — End: 2023-06-27
  Administered 2023-06-24: 1 via TOPICAL
  Filled 2023-06-24: qty 10

## 2023-06-24 NOTE — Plan of Care (Signed)

## 2023-06-24 NOTE — Assessment & Plan Note (Addendum)
 Complicated by tongue palsy and dysphagia status post PEG tube Patient is able to walk however with assistance using an assistive device Fall risk.  Nonverbal.   PEG tube in situ.  Nutrition following for management of tube feeds.   Patient follows with Dr. Annamarie Barrier with neurology at [facility]

## 2023-06-24 NOTE — Assessment & Plan Note (Addendum)
 Continued slow clinical improvement  Patient remains fever free, procalcitonin downtrending. Chest x-ray today reveals some improvement in aeration compared to admission chest x-ray. pulmonary consulted, their input is appreciated.  Continue broad-spectrum empiric intravenous antibiotics with Unasyn , vancomycin .  Azithromycin  has been discontinued. MRSA PCR positive Rhinovirus/enterovirus found to be positive on respiratory virus PCR testing Weaning supplemental oxygen as able vest therapy or CoughAssist as able Supplemental oxygen for associated hypoxia, weaning as able.  Patient is not on supplemental oxygen at home. Following blood cultures that are currently without growth SIRS on admission resolved

## 2023-06-24 NOTE — Progress Notes (Signed)
 Mobility Specialist - Progress Note  (Experiment 4L) Pre-mobility: 92% SpO2 During mobility: 86% SpO2   06/24/23 1435  Mobility  Activity Ambulated with assistance in hallway  Level of Assistance Contact guard assist, steadying assist  Assistive Device Front wheel walker  Distance Ambulated (ft) 250 ft  Range of Motion/Exercises Active  Activity Response Tolerated well  Mobility Referral Yes  Mobility visit 1 Mobility  Mobility Specialist Start Time (ACUTE ONLY) 1425  Mobility Specialist Stop Time (ACUTE ONLY) 1435  Mobility Specialist Time Calculation (min) (ACUTE ONLY) 10 min   Pt was found in bed and agreeable to ambulate. Upon getting SPO2 reading on device advised pt to return to room due to SPO2 86%. Pt wanting to keep ambulating but advised to room due to RRT about to give treatment. Pt returned to bed with call bell in reach and RRT in room.  Lorna Rose Mobility Specialist

## 2023-06-24 NOTE — Assessment & Plan Note (Addendum)
 Status post recent placement of PEG tube in January at [facility] Poor muscle tone with temporal wasting, BMI 18.5 Nutrition consulted, their input is appreciated Albumin  currently 2.4.

## 2023-06-24 NOTE — Assessment & Plan Note (Signed)
 Incidental finding on CT  outpatient eval with ultrasound if desired

## 2023-06-24 NOTE — Progress Notes (Signed)
 PROGRESS NOTE   Guy Payne Orth  UJW:[id number] DOB: October 11, 1950 DOA: 06/20/2023 PCP: Mordechai April, DO   Date of Service: the patient was seen and examined on 06/24/2023  Brief Narrative:  73 y.o. 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  Upon evaluation in the emergency department patient was found to have multiple SIRS criteria concerning for sepsis with chest x-ray revealing bibasilar pneumonia.  Patient was started on broad-spectrum intravenous antibiotics as well as supplemental oxygen and the hospitalist group was then called to assess the patient for admission to the hospital.   Assessment & Plan Pneumonia of both lower lobes due to infectious organism Continued slow clinical improvement  Patient remains fever free, procalcitonin downtrending. Chest x-ray today reveals some improvement in aeration compared to admission chest x-ray. pulmonary consulted, their input is appreciated.  Continue broad-spectrum empiric intravenous antibiotics with Unasyn , vancomycin .  Azithromycin  has been discontinued. MRSA PCR positive Rhinovirus/enterovirus found to be positive on respiratory virus PCR testing Weaning supplemental oxygen as able vest therapy or CoughAssist as able Supplemental oxygen for associated hypoxia, weaning as able.  Patient is not on supplemental oxygen at home. Following blood cultures that are currently without growth SIRS on admission resolved  Sepsis due to Streptococcus pneumoniae with acute hypoxic respiratory failure (HCC) Remainder of workup and treatment as above. 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  Nonverbal.   PEG tube in situ.  Nutrition following for management of tube feeds.   Patient follows with Dr. Annamarie Barrier with neurology at [facility] Renal cyst Incidental finding on CT  outpatient eval with ultrasound if desired Protein-calorie malnutrition, severe Status post recent placement of PEG tube in January at [facility] Poor muscle tone with temporal wasting, BMI 18.5 Nutrition consulted, their input is appreciated Albumin  currently 2.4.     Subjective:  Patient nonverbal which is his baseline.  Patient suggest that he is "a little" better.  Family continues to voice concerns over patient's ongoing cough and oxygen requirement.  Physical Exam:  Vitals:   06/23/23 2100 06/23/23 2124 06/24/23 0426 06/24/23 0427  BP:  116/73 123/76   Pulse:  94 80   Resp:  20 20   Temp:  98.7 F (37.1 C) 98.5 F (36.9 C)   TempSrc:  Oral Oral   SpO2: 90% 91% 93%   Weight:    55.2 kg  Height:        Constitutional: Awake, alert, nonverbal.  Patient is not in any acute distress.  Patient is actively coughing. Skin: no rashes, no lesions, poor skin turgor noted. Eyes: Pupils are equally reactive to light.  No evidence of scleral icterus or conjunctival pallor.  ENMT: Dry mucous membranes noted with notable dry mucous in the oropharynx.  No obvious thrush.   Respiratory: Diminished breath sounds at the bases.  With very faint rales at the bases.  No significant wheezing.  Patient is no longer tachypneic.  Improved air movement compared to yesterday. Cardiovascular: Regular rate and rhythm.  No murmurs / rubs / gallops. No extremity edema. 2+ pedal pulses. No carotid bruits.  Abdomen: Abdomen is soft and nontender.  No evidence of intra-abdominal masses.  Positive bowel sounds noted in all quadrants.   Musculoskeletal: No joint deformity upper and lower extremities.  Poor muscle tone noted.   Data Reviewed:  I have personally reviewed and  interpreted labs, imaging.  Significant findings are   CBC: Recent Labs  Lab 06/20/23 1503 06/21/23 0350 06/22/23 0353 06/24/23 0417  WBC 12.7* 10.1 11.3* 11.8*  NEUTROABS 11.5*  --  9.5* 9.3*  HGB 14.2 11.7* 11.3* 12.3*  HCT 43.7 35.7* 34.3* 37.4*  MCV 96.3 95.2 96.9 93.7  PLT 227 196 192 257   Basic Metabolic Panel: Recent Labs  Lab 06/20/23 1503 06/21/23 0350 06/21/23 1634 06/22/23 0353 06/22/23 1726 06/23/23 0332 06/24/23 0417  NA 139 140  --  137  --   --  140  K 4.5 3.8  --  3.4*  --   --  3.9  CL 104 107  --  102  --   --  107  CO2 24 25  --  26  --   --  23  GLUCOSE 183* 135*  --  129*  --   --  120*  BUN 33* 23  --  15  --   --  20  CREATININE 0.76 0.69  --  0.68  --   --  0.57*  CALCIUM 8.9 8.2*  --  8.0*  --   --  8.4*  MG  --   --  1.9 1.7 1.8 1.8 1.9  PHOS  --   --  2.7 3.5 3.7 4.1 3.6   GFR: Estimated Creatinine Clearance: 63.3 mL/min (A) (by C-G formula based on SCr of 0.57 mg/dL (L)). Liver Function Tests: Recent Labs  Lab 06/20/23 1503 06/22/23 0353 06/24/23 0417  AST 25 26 33  ALT 25 26 40  ALKPHOS 67 56 68  BILITOT 0.8 0.7 0.6  PROT 7.5 5.5* 6.0*  ALBUMIN  3.4* 2.3* 2.4*    Coagulation Profile: Recent Labs  Lab 06/20/23 1503 06/21/23 0350  INR 1.2 1.3*     Code Status:  Full code.  Code status decision has been confirmed with: wife at the bedside Family Communication: Wife is at the bedside and has been updated on plan of care.   Severity of Illness:  The appropriate patient status for this patient is INPATIENT. Inpatient status is judged to be reasonable and necessary in order to provide the required intensity of service to ensure the patient's safety. The patient's presenting symptoms, physical exam findings, and initial radiographic and laboratory data in the context of their chronic comorbidities is felt to place them at high risk for further clinical deterioration. Furthermore, it is not anticipated that the patient will be  medically stable for discharge from the hospital within 2 midnights of admission.   * I certify that at the point of admission it is my clinical judgment that the patient will require inpatient hospital care spanning beyond 2 midnights from the point of admission due to high intensity of service, high risk for further deterioration and high frequency of surveillance required.*  Time spent:  50 minutes  Author:  True Fuss MD  06/24/2023 8:45 AM

## 2023-06-24 NOTE — Assessment & Plan Note (Addendum)
 Remainder of workup and treatment as above.

## 2023-06-24 NOTE — Progress Notes (Signed)
 Mobility Specialist - Progress Note  (3L [place]) Pre-mobility: 86 bpm HR, 92% SpO2   06/24/23 0929  Mobility  Activity Ambulated with assistance in hallway  Level of Assistance Standby assist, set-up cues, supervision of patient - no hands on  Assistive Device Front wheel walker  Distance Ambulated (ft) 550 ft  Range of Motion/Exercises Active  Activity Response Tolerated well  Mobility Referral Yes  Mobility visit 1 Mobility  Mobility Specialist Start Time (ACUTE ONLY) 0910  Mobility Specialist Stop Time (ACUTE ONLY) 0929  Mobility Specialist Time Calculation (min) (ACUTE ONLY) 19 min   Pt was found in bed and agreeable to ambulate. No complaints with session, unable to read SPO2 during ambulation due to weak reading. Was left in bed due to needing to get chest x-ray. Wife in room with all needs met. RN notified of session.  Lorna Rose Mobility Specialist

## 2023-06-25 DIAGNOSIS — B348 Other viral infections of unspecified site: Secondary | ICD-10-CM

## 2023-06-25 DIAGNOSIS — J9601 Acute respiratory failure with hypoxia: Secondary | ICD-10-CM | POA: Diagnosis not present

## 2023-06-25 DIAGNOSIS — J918 Pleural effusion in other conditions classified elsewhere: Secondary | ICD-10-CM | POA: Diagnosis not present

## 2023-06-25 DIAGNOSIS — G1221 Amyotrophic lateral sclerosis: Secondary | ICD-10-CM | POA: Diagnosis not present

## 2023-06-25 DIAGNOSIS — J189 Pneumonia, unspecified organism: Secondary | ICD-10-CM | POA: Diagnosis not present

## 2023-06-25 LAB — LEGIONELLA PNEUMOPHILA SEROGP 1 UR AG: L. pneumophila Serogp 1 Ur Ag: NEGATIVE

## 2023-06-25 LAB — CBC WITH DIFFERENTIAL/PLATELET
Abs Immature Granulocytes: 0.27 10*3/uL — ABNORMAL HIGH (ref 0.00–0.07)
Basophils Absolute: 0.1 10*3/uL (ref 0.0–0.1)
Basophils Relative: 1 %
Eosinophils Absolute: 0.6 10*3/uL — ABNORMAL HIGH (ref 0.0–0.5)
Eosinophils Relative: 5 %
HCT: 40.7 % (ref 39.0–52.0)
Hemoglobin: 13.1 g/dL (ref 13.0–17.0)
Immature Granulocytes: 2 %
Lymphocytes Relative: 6 %
Lymphs Abs: 0.7 10*3/uL (ref 0.7–4.0)
MCH: 30.8 pg (ref 26.0–34.0)
MCHC: 32.2 g/dL (ref 30.0–36.0)
MCV: 95.8 fL (ref 80.0–100.0)
Monocytes Absolute: 1 10*3/uL (ref 0.1–1.0)
Monocytes Relative: 8 %
Neutro Abs: 9.7 10*3/uL — ABNORMAL HIGH (ref 1.7–7.7)
Neutrophils Relative %: 78 %
Platelets: 297 10*3/uL (ref 150–400)
RBC: 4.25 MIL/uL (ref 4.22–5.81)
RDW: 12.6 % (ref 11.5–15.5)
WBC: 12.3 10*3/uL — ABNORMAL HIGH (ref 4.0–10.5)
nRBC: 0 % (ref 0.0–0.2)

## 2023-06-25 LAB — RENAL FUNCTION PANEL
Albumin: 2.5 g/dL — ABNORMAL LOW (ref 3.5–5.0)
Anion gap: 7 (ref 5–15)
BUN: 20 mg/dL (ref 8–23)
CO2: 28 mmol/L (ref 22–32)
Calcium: 8.5 mg/dL — ABNORMAL LOW (ref 8.9–10.3)
Chloride: 103 mmol/L (ref 98–111)
Creatinine, Ser: 0.57 mg/dL — ABNORMAL LOW (ref 0.61–1.24)
GFR, Estimated: >60 mL/min (ref >60)
Glucose, Bld: 111 mg/dL — ABNORMAL HIGH (ref 70–99)
Phosphorus: 3.9 mg/dL (ref 2.5–4.6)
Potassium: 4 mmol/L (ref 3.5–5.1)
Sodium: 138 mmol/L (ref 135–145)

## 2023-06-25 LAB — CULTURE, BLOOD (ROUTINE X 2)
Culture: NO GROWTH
Culture: NO GROWTH
Special Requests: ADEQUATE
Special Requests: ADEQUATE

## 2023-06-25 LAB — GLUCOSE, CAPILLARY
Glucose-Capillary: 100 mg/dL — ABNORMAL HIGH (ref 70–99)
Glucose-Capillary: 116 mg/dL — ABNORMAL HIGH (ref 70–99)
Glucose-Capillary: 122 mg/dL — ABNORMAL HIGH (ref 70–99)
Glucose-Capillary: 132 mg/dL — ABNORMAL HIGH (ref 70–99)
Glucose-Capillary: 136 mg/dL — ABNORMAL HIGH (ref 70–99)
Glucose-Capillary: 86 mg/dL (ref 70–99)

## 2023-06-25 LAB — MAGNESIUM: Magnesium: 1.9 mg/dL (ref 1.7–2.4)

## 2023-06-25 NOTE — Assessment & Plan Note (Signed)
 Complicated by tongue palsy and dysphagia status post PEG tube Patient is able to walk however with assistance using an assistive device Fall risk.  Nonverbal.   PEG tube in situ.  Nutrition following for management of tube feeds.   Patient follows with Dr. Annamarie Barrier with neurology at [facility]

## 2023-06-25 NOTE — Progress Notes (Signed)
 NAME:  Guy Payne, MRN:  [id number], DOB:  June 09, 1950, LOS: 5 ADMISSION DATE:  06/20/2023, CONSULTATION DATE:  06/25/23 REFERRING MD:  Lanette Pipe, CHIEF COMPLAINT:  pneumonia   History of Present Illness:  Guy Payne is a 73 y.o. M with PMH significant for ALS and tongue palsy with PEG who presented to the ER with several days of productive cough and fever and was found to have basilar pneumonia with viral panel positive for rhinovirus.  He is followed at the ALS clinic through [facility] and spouse was inquiring whether pt meets criteria for home NIV.  Pertinent  Medical History   has a past medical history of ALS (amyotrophic lateral sclerosis) (HCC), Fall (2021), MCI (mild cognitive impairment), Memory loss, Prostate disorder, and Restless leg.   Significant Hospital Events: Including procedures, antibiotic start and stop dates in addition to other pertinent events   4/30 admit with viral pneumonia   Interim History / Subjective:  Feels about the same since admission. Still coughing, not able to expectorate his secretions. Oxygen decreased to 3L.    Objective   Blood pressure 117/68, pulse 80, temperature 98.4 F (36.9 C), temperature source Oral, resp. rate 18, height 5\' 8"  (1.727 m), weight 55.7 kg, SpO2 97%.    FiO2 (%):  [28 %] 28 %   Intake/Output Summary (Last 24 hours) at 06/25/2023 1023 Last data filed at 06/25/2023 0600 Gross per 24 hour  Intake 730 ml  Output 500 ml  Net 230 ml   Filed Weights   06/23/23 0458 06/24/23 0427 06/25/23 0500  Weight: 58.7 kg 55.2 kg 55.7 kg   General: chronically ill appearing man sitting up in bed in NAD HEENT: Guy Payne/AT, eyes anicteric Neuro: awake, alert, moving all extremities. Limited coordination, unable to talk, but communicates nonverbally. CV: S1S2, RRR PULM: still has faint basilar rhales, symmetric breath sounds. Tachypnea but no accessory muscle use GI: soft, NT Extremities: no cyanosis or edema  Skin: warm, dry, no  rashes  WBC 12.3 CXR personally reviewed: small dependent left pleural effusion, bibasilar opacities   Resolved Hospital Problem list     Assessment & Plan:   Sepsis and acute hypoxic respiratory failure due to viral pneumonia in the setting of ALS Small left parapneumonic effusion on left -effusion likely too small to tap, but can consider if it enlarges -con't coughing for good pulmonary hygiene -wean O2 as able -needs OP follow up imaging to ensure resolution of opacities 6-8 weeks after infection. -mobility as able -con't home cough assist regimen, getting CPT & hypertonic saline nebs -can NTS if needed -con't CPT and hypertonic saline to ensure he has adequate assistance for  -empiric antibiotics -Pt did not meet criteria for NIV at home per last [facility] note, given his acute infection, it is difficult to assess if pt meets criteria for long term NIV while hospitalized. No hypercapnia on admission blood gas, suggesting he does not need home NIV at present. Will need ongoing close follow up with ALS clinic as an outpatient.    Best Practice (right click and "Reselect all SmartList Selections" daily)   Per primary  Labs   CBC: Recent Labs  Lab 06/20/23 1503 06/21/23 0350 06/22/23 0353 06/24/23 0417 06/25/23 0826  WBC 12.7* 10.1 11.3* 11.8* 12.3*  NEUTROABS 11.5*  --  9.5* 9.3* 9.7*  HGB 14.2 11.7* 11.3* 12.3* 13.1  HCT 43.7 35.7* 34.3* 37.4* 40.7  MCV 96.3 95.2 96.9 93.7 95.8  PLT 227 196 192 257 297  Basic Metabolic Panel: Recent Labs  Lab 06/20/23 1503 06/21/23 0350 06/21/23 1634 06/22/23 0353 06/22/23 1726 06/23/23 0332 06/24/23 0417 06/25/23 0826  NA 139 140  --  137  --   --  140 138  K 4.5 3.8  --  3.4*  --   --  3.9 4.0  CL 104 107  --  102  --   --  107 103  CO2 24 25  --  26  --   --  23 28  GLUCOSE 183* 135*  --  129*  --   --  120* 111*  BUN 33* 23  --  15  --   --  20 20  CREATININE 0.76 0.69  --  0.68  --   --  0.57* 0.57*  CALCIUM 8.9  8.2*  --  8.0*  --   --  8.4* 8.5*  MG  --   --    < > 1.7 1.8 1.8 1.9 1.9  PHOS  --   --    < > 3.5 3.7 4.1 3.6 3.9   < > = values in this interval not displayed.   GFR: Estimated Creatinine Clearance: 63.8 mL/min (A) (by C-G formula based on SCr of 0.57 mg/dL (L)). Recent Labs  Lab 06/20/23 1602 06/20/23 1713 06/21/23 0350 06/21/23 1634 06/22/23 0353 06/22/23 1726 06/24/23 0417 06/25/23 0826  PROCALCITON  --   --   --  4.13  --  2.82 0.97  --   WBC  --   --  10.1  --  11.3*  --  11.8* 12.3*  LATICACIDVEN 2.9* 2.0*  --  1.6  --   --   --   --     Critical care time: n/a     Joesph Mussel, DO 06/25/23 12:18 PM [facility]  For contact information, see Amion. If no response to pager, please call PCCM consult pager. After hours, 7PM- 7AM, please call Elink.

## 2023-06-25 NOTE — Progress Notes (Signed)
 PT Cancellation Note  Patient Details Name: Guy Payne MRN: [id number] DOB: May 17, 1950   Cancelled Treatment:    Reason Eval/Treat Not Completed:  Attempted PT eval-pt sleeping soundly-did not awaken to voice or tactile stimuli. Caregiver present-reports pt had some respiratory difficutly a little while ago. She feels he is a bit weaker than normal. Will check back on tomorrow for PT eval.    Tanda Falter, PT Acute Rehabilitation  Office: [phone]

## 2023-06-25 NOTE — Progress Notes (Signed)
 Patient ambulated on 2L [place] in the hallway. Patient's saturations remained between 90-92% for the duration of ambulating about 400 feet. Patient placed back in chair in room on 2L [place] and sats were 94%. Patient did not express any SOB during ambulation session.

## 2023-06-25 NOTE — Progress Notes (Signed)
 Mobility Specialist - Progress Note  ([place] 3L) During mobility: 103 bpm HR, 90% SpO2 Post-mobility: 103 bpm HR, 92% SPO2   06/25/23 0935  Mobility  Activity Ambulated with assistance in hallway  Level of Assistance Standby assist, set-up cues, supervision of patient - no hands on  Assistive Device Front wheel walker  Distance Ambulated (ft) 350 ft  Range of Motion/Exercises Active  Activity Response Tolerated well  Mobility Referral Yes  Mobility visit 1 Mobility  Mobility Specialist Start Time (ACUTE ONLY) 0920  Mobility Specialist Stop Time (ACUTE ONLY) 0935  Mobility Specialist Time Calculation (min) (ACUTE ONLY) 15 min   Pt was found at room door ready to ambulate. No complaints with session and returned to bed with all needs met. Call bell in reach and RN notified of session.  Lorna Rose Mobility Specialist

## 2023-06-25 NOTE — Assessment & Plan Note (Signed)
 Remainder of workup and treatment as above.

## 2023-06-25 NOTE — Assessment & Plan Note (Addendum)
 Continued slow clinical improvement  Patient remains fever free, procalcitonin downtrending. Repeat chest x-ray 5/3 reveals some improvement in aeration compared to admission chest x-ray. pulmonary following,  their input is appreciated.  Continue broad-spectrum empiric intravenous antibiotics with day 5 of Unasyn , day 4 of vancomycin .   Azithromycin  has been discontinued. MRSA PCR positive Rhinovirus/enterovirus found to be positive on respiratory virus PCR testing Weaning supplemental oxygen as able, patient was not previously on oxygen at home Chest physiotherapy,  vest therapy or CoughAssist as able Supplemental oxygen for associated hypoxia, weaning as able.  Patient is not on supplemental oxygen at home. Following blood cultures that are currently without growth SIRS on admission resolved

## 2023-06-25 NOTE — Progress Notes (Signed)
 PROGRESS NOTE   Guy Payne  ZOX:[id number] DOB: May 30, 1950 DOA: 06/20/2023 PCP: Mordechai April, DO   Date of Service: the patient was seen and examined on 06/25/2023  Brief Narrative:  73 y.o. male with a PMH of ALS complicated by inability to phonate, HTN, severe protein calorie malnutrition and dysphagia status post PEG tube placement  (03/07/2023) who presented to [facility] emergency department with increasing cough and recurrent fevers in the home setting.  Upon evaluation in the emergency department patient was found to have multiple SIRS criteria concerning for sepsis with chest x-ray revealing bibasilar pneumonia.  Patient was started on broad-spectrum intravenous antibiotics as well as supplemental oxygen and the hospitalist group was then called to assess the patient for admission to the hospital.   Assessment & Plan Pneumonia of both lower lobes due to infectious organism Continued slow clinical improvement  Patient remains fever free, procalcitonin downtrending. Repeat chest x-ray 5/3 reveals some improvement in aeration compared to admission chest x-ray. pulmonary following,  their input is appreciated.  Continue broad-spectrum empiric intravenous antibiotics with day 5 of Unasyn , day 4 of vancomycin .   Azithromycin  has been discontinued. MRSA PCR positive Rhinovirus/enterovirus found to be positive on respiratory virus PCR testing Weaning supplemental oxygen as able, patient was not previously on oxygen at home Chest physiotherapy,  vest therapy or CoughAssist as able Supplemental oxygen for associated hypoxia, weaning as able.  Patient is not on supplemental oxygen at home. Following blood cultures that are currently without growth SIRS on admission resolved  Sepsis due to Streptococcus pneumoniae with acute hypoxic respiratory failure (HCC) Remainder of workup and treatment as above. 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  Nonverbal.   PEG tube in situ.  Nutrition following for management of tube feeds.   Patient follows with Dr. Annamarie Barrier with neurology at [facility] Renal cyst Incidental finding on CT  outpatient eval with ultrasound if desired Protein-calorie malnutrition, severe Status post recent placement of PEG tube in January at [facility] Poor muscle tone with temporal wasting, BMI 18.5 Nutrition consulted, their input is appreciated Albumin  currently 2.4 on 5/3.      Subjective:  Patient nonverbal but is of normal mental acuity which is his baseline.  Patient continues to suggest nonverbally that he is getting slightly better.  Caretaker continues to voice concerns over patient's ongoing cough and oxygen requirement as did the wife the day before.  Physical Exam:  Vitals:   06/24/23 1954 06/24/23 2009 06/25/23 0500 06/25/23 0525  BP: 116/69   117/68  Pulse: 84   80  Resp: 20   18  Temp: 99 F (37.2 C)   98.4 F (36.9 C)  TempSrc: Oral   Oral  SpO2: 95% 97%  97%  Weight:   55.7 kg   Height:        Constitutional: Awake, alert, nonverbal.  Patient is not in any acute distress.   Skin: no rashes, no lesions, poor skin turgor noted. Eyes: Pupils are equally reactive to light.  No evidence of scleral icterus or conjunctival pallor.  ENMT: Dry mucous membranes noted with notable dry mucous in the oropharynx.  No obvious thrush.   Respiratory: Diminished breath sounds at the bases.  Basilar rales seems to have resolved.  No significant wheezing.  Patient is no longer tachypneic.  Improved air movement compared to yesterday.  Cardiovascular: Regular rate and rhythm.  No murmurs / rubs / gallops. No extremity edema. 2+ pedal pulses. No carotid bruits.  Abdomen: Abdomen is soft and nontender.  No evidence of intra-abdominal masses.  Positive bowel sounds noted in all  quadrants.   Musculoskeletal: No joint deformity upper and lower extremities.  Poor muscle tone noted.   Data Reviewed:  I have personally reviewed and interpreted labs, imaging.  Significant findings are   CBC: Recent Labs  Lab 06/20/23 1503 06/21/23 0350 06/22/23 0353 06/24/23 0417  WBC 12.7* 10.1 11.3* 11.8*  NEUTROABS 11.5*  --  9.5* 9.3*  HGB 14.2 11.7* 11.3* 12.3*  HCT 43.7 35.7* 34.3* 37.4*  MCV 96.3 95.2 96.9 93.7  PLT 227 196 192 257   Basic Metabolic Panel: Recent Labs  Lab 06/20/23 1503 06/21/23 0350 06/21/23 1634 06/22/23 0353 06/22/23 1726 06/23/23 0332 06/24/23 0417  NA 139 140  --  137  --   --  140  K 4.5 3.8  --  3.4*  --   --  3.9  CL 104 107  --  102  --   --  107  CO2 24 25  --  26  --   --  23  GLUCOSE 183* 135*  --  129*  --   --  120*  BUN 33* 23  --  15  --   --  20  CREATININE 0.76 0.69  --  0.68  --   --  0.57*  CALCIUM 8.9 8.2*  --  8.0*  --   --  8.4*  MG  --   --  1.9 1.7 1.8 1.8 1.9  PHOS  --   --  2.7 3.5 3.7 4.1 3.6   GFR: Estimated Creatinine Clearance: 63.8 mL/min (A) (by C-G formula based on SCr of 0.57 mg/dL (L)). Liver Function Tests: Recent Labs  Lab 06/20/23 1503 06/22/23 0353 06/24/23 0417  AST 25 26 33  ALT 25 26 40  ALKPHOS 67 56 68  BILITOT 0.8 0.7 0.6  PROT 7.5 5.5* 6.0*  ALBUMIN  3.4* 2.3* 2.4*    Coagulation Profile: Recent Labs  Lab 06/20/23 1503 06/21/23 0350  INR 1.2 1.3*     Code Status:  Full code.  Code status decision has been confirmed with: wife at the bedside Family Communication: Wife is at the bedside and has been updated on plan of care.   Severity of Illness:  The appropriate patient status for this patient is INPATIENT. Inpatient status is judged to be reasonable and necessary in order to provide the required intensity of service to ensure the patient's safety. The patient's presenting symptoms, physical exam findings, and initial radiographic and laboratory data in the context of  their chronic comorbidities is felt to place them at high risk for further clinical deterioration. Furthermore, it is not anticipated that the patient will be medically stable for discharge from the hospital within 2 midnights of admission.   * I certify that at the point of admission it is my clinical judgment that the patient will require inpatient hospital care spanning beyond 2 midnights from the point of admission due to high intensity of service, high risk for further deterioration and high frequency of surveillance required.*  Time spent:  44 minutes  Author:  True Fuss MD  06/25/2023 8:12 AM

## 2023-06-25 NOTE — Assessment & Plan Note (Addendum)
 Status post recent placement of PEG tube in January at [facility] Poor muscle tone with temporal wasting, BMI 18.5 Nutrition consulted, their input is appreciated Albumin  currently 2.4 on 5/3.

## 2023-06-25 NOTE — Assessment & Plan Note (Signed)
 Incidental finding on CT  outpatient eval with ultrasound if desired

## 2023-06-26 DIAGNOSIS — J189 Pneumonia, unspecified organism: Secondary | ICD-10-CM | POA: Diagnosis not present

## 2023-06-26 DIAGNOSIS — J90 Pleural effusion, not elsewhere classified: Secondary | ICD-10-CM

## 2023-06-26 LAB — CBC WITH DIFFERENTIAL/PLATELET
Abs Immature Granulocytes: 0.43 10*3/uL — ABNORMAL HIGH (ref 0.00–0.07)
Basophils Absolute: 0.1 10*3/uL (ref 0.0–0.1)
Basophils Relative: 1 %
Eosinophils Absolute: 0.6 10*3/uL — ABNORMAL HIGH (ref 0.0–0.5)
Eosinophils Relative: 4 %
HCT: 38.1 % — ABNORMAL LOW (ref 39.0–52.0)
Hemoglobin: 12.3 g/dL — ABNORMAL LOW (ref 13.0–17.0)
Immature Granulocytes: 3 %
Lymphocytes Relative: 5 %
Lymphs Abs: 0.7 10*3/uL (ref 0.7–4.0)
MCH: 31.1 pg (ref 26.0–34.0)
MCHC: 32.3 g/dL (ref 30.0–36.0)
MCV: 96.5 fL (ref 80.0–100.0)
Monocytes Absolute: 1 10*3/uL (ref 0.1–1.0)
Monocytes Relative: 7 %
Neutro Abs: 10.5 10*3/uL — ABNORMAL HIGH (ref 1.7–7.7)
Neutrophils Relative %: 80 %
Platelets: 310 10*3/uL (ref 150–400)
RBC: 3.95 MIL/uL — ABNORMAL LOW (ref 4.22–5.81)
RDW: 12.5 % (ref 11.5–15.5)
WBC: 13.3 10*3/uL — ABNORMAL HIGH (ref 4.0–10.5)
nRBC: 0 % (ref 0.0–0.2)

## 2023-06-26 LAB — COMPREHENSIVE METABOLIC PANEL WITH GFR
ALT: 71 U/L — ABNORMAL HIGH (ref 0–44)
AST: 41 U/L (ref 15–41)
Albumin: 2.4 g/dL — ABNORMAL LOW (ref 3.5–5.0)
Alkaline Phosphatase: 66 U/L (ref 38–126)
Anion gap: 9 (ref 5–15)
BUN: 23 mg/dL (ref 8–23)
CO2: 24 mmol/L (ref 22–32)
Calcium: 8.3 mg/dL — ABNORMAL LOW (ref 8.9–10.3)
Chloride: 106 mmol/L (ref 98–111)
Creatinine, Ser: 0.58 mg/dL — ABNORMAL LOW (ref 0.61–1.24)
GFR, Estimated: >60 mL/min (ref >60)
Glucose, Bld: 129 mg/dL — ABNORMAL HIGH (ref 70–99)
Potassium: 3.9 mmol/L (ref 3.5–5.1)
Sodium: 139 mmol/L (ref 135–145)
Total Bilirubin: 0.3 mg/dL (ref 0.0–1.2)
Total Protein: 6.4 g/dL — ABNORMAL LOW (ref 6.5–8.1)

## 2023-06-26 LAB — GLUCOSE, CAPILLARY
Glucose-Capillary: 101 mg/dL — ABNORMAL HIGH (ref 70–99)
Glucose-Capillary: 113 mg/dL — ABNORMAL HIGH (ref 70–99)
Glucose-Capillary: 114 mg/dL — ABNORMAL HIGH (ref 70–99)
Glucose-Capillary: 120 mg/dL — ABNORMAL HIGH (ref 70–99)
Glucose-Capillary: 128 mg/dL — ABNORMAL HIGH (ref 70–99)
Glucose-Capillary: 140 mg/dL — ABNORMAL HIGH (ref 70–99)

## 2023-06-26 LAB — MAGNESIUM: Magnesium: 2 mg/dL (ref 1.7–2.4)

## 2023-06-26 LAB — PHOSPHORUS: Phosphorus: 3.8 mg/dL (ref 2.5–4.6)

## 2023-06-26 NOTE — TOC Progression Note (Signed)
 Transition of Care [facility]) - Progression Note   Patient Details  Name: Guy Payne MRN: [id number] Date of Birth: August 20, 1950  [facility] CM/SW Contact  Zenon Hilda, LCSW Phone Number: 06/26/2023, 3:39 PM  Clinical Narrative: PT evaluation recommended HH. CSW followed up with wife. Per wife, patient is active with [facility] for PT/OT/RN/speech. CSW reached out to [place] with [facility] to confirm. Patient will need resumption orders prior to discharge.  Expected Discharge Plan: Home w Home Health Services Barriers to Discharge: Continued Medical Work up  Expected Discharge Plan and Services In-house Referral: Clinical Social Work Discharge Planning Services: NA Post Acute Care Choice: Home Health Living arrangements for the past 2 months: Single Family Home HH Agency: [facility] Date [facility] Agency Contacted: 06/26/23 Time HH Agency Contacted: 1450 Representative spoke with at [facility] Agency: Imelda Man  Social Determinants of Health (SDOH) Interventions SDOH Screenings   Food Insecurity: No Food Insecurity (06/20/2023)  Housing: Low Risk  (06/20/2023)  Transportation Needs: No Transportation Needs (06/20/2023)  Utilities: Not At Risk (06/20/2023)  Social Connections: Unknown (06/20/2023)  Tobacco Use: Low Risk  (06/20/2023)   Readmission Risk Interventions     No data to display

## 2023-06-26 NOTE — Progress Notes (Signed)
 Pt care assumed from previous RN at 1500 this shift. This RN has reviewed the previous nurse assessment and am in agreement. Pt currently resting comfortably in the room with family at bedside. Continue with plan of care.

## 2023-06-26 NOTE — Progress Notes (Signed)
 PROGRESS NOTE    Guy Payne  HQI:[id number] DOB: 08-Jun-1950 DOA: 06/20/2023 PCP: Mordechai April, DO    Brief Narrative:   Guy Payne is a 73 y.o. male with past medical history significant for ALS complicated by inability to phonate, HTN, severe protein calorie malnutrition and dysphagia status post PEG tube placement (03/07/2023) who presented to [facility] emergency department on 06/20/2023 with increasing cough and recurrent fevers in the home setting.   Upon evaluation in the emergency department patient was found to have multiple SIRS criteria concerning for sepsis with chest x-ray revealing bibasilar pneumonia.  Patient was started on broad-spectrum intravenous antibiotics as well as supplemental oxygen and the hospitalist group consulted for admission.  Assessment & Plan:   Sepsis, POA Acute hypoxic respiratory failure, POA Pneumonia of both lower lobes due to infectious organism Patient presenting to ED with recurrent fever, cough complicated by his underlying ALS.  WBC elevated 12.2 with temperature 9 9.8 F on admission. Chest x-ray with bibasilar airspace disease consistent with pneumonia versus aspiration versus edema.  CT chest/abdomen/pelvis with extensive bibasilar airspace disease compatible with infection versus aspiration.  TTE with LVEF 65 to 70%, LV with normal function with no regional wall motion normalities, grade 1 diastolic dysfunction, no aortic stenosis.  Urine Legionella/strep pneumo antigen negative. MRSA PCR postiive. Respiratory viral panel positive for rhinovirus/enterovirus.  Blood cultures x 2 with no growth x 5 days.  Sputum culture with sample not acceptable for testing.  Completed 3-day course of azithromycin . 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  Nonverbal.  Patient is able to walk however with assistance using an assistive device. Patient follows with Dr. Annamarie Barrier with neurology at [facility].  -- Aspiration precautions -- Outpatient follow-up with ALS clinic  Renal cyst Incidental finding on CT . Outpatient eval with ultrasound if desired  Protein-calorie malnutrition, severe Status post recent placement of PEG tube in January at [facility]. Poor muscle tone with temporal wasting, BMI 18.5. Albumin  currently 2.4 on 5/3.  -- Dietitian following, appreciate assistance  -- Continue tube feeds   DVT prophylaxis: enoxaparin  (LOVENOX ) injection 40 mg Start: 06/21/23 0800 SCDs Start: 06/20/23 1731    Code Status: Full Code Family Communication: Updated spouse present bedside this morning  Disposition Plan:  Level of care: Progressive Status is: Inpatient Remains inpatient appropriate because: IV antibiotics    Consultants:  PCCM  Procedures:  none  Antimicrobials:  Vancomycin  4/29>> Unasyn  4/29>> Metronidazole  4/29 - 4/29 Cefepime  4/29 - 4/29 Azithromycin  4/29 - 5/1   Subjective: Patient seen examined bedside, lying in bed.  Currently undergoing a vest chest physiotherapy by RT.  Spouse present at bedside.  Asking multiple questions regarding lab values.  Seen by PCCM this morning, checking procalcitonin, ESR, BNP.  Patient nonverbal he reports that his dyspnea improving, however remains with nonproductive cough.  No other specific questions or concerns at this time.  No acute events overnight per nursing  staff.  Objective: Vitals:   06/25/23 2003 06/26/23 0457 06/26/23 0500 06/26/23 0804  BP: 100/80 114/60    Pulse: 95 76    Resp: 20 (!) 22    Temp: 97.9 F (36.6 C) 98.8 F (37.1 C)    TempSrc: Oral Oral    SpO2: 97% 96%  96%  Weight:   56 kg   Height:        Intake/Output Summary (Last 24 hours) at 06/26/2023 1113 Last data filed at 06/26/2023 0749 Gross per 24 hour  Intake 0 ml  Output --  Net 0 ml   Filed Weights   06/24/23 0427 06/25/23 0500 06/26/23 0500  Weight: 55.2 kg 55.7 kg 56 kg    Examination:  Physical Exam: GEN: NAD, alert, chronically ill in appearance, sitting up in bed HEENT: NCAT, eyes anicteric PULM: Bibasilar rales, normal respiratory effort without accessory muscle use, on 4 L nasal cannula at rest with SpO2 96% CV: RRR w/o M/G/R GI: abd soft, NTND, NABS, no R/G/M MSK: no peripheral edema NEURO: Awake, alert, moves all extremities independently but with limited coordination, unable to phonate but communicates verbally Integumentary: Warm, dry, no concerning rashes    Data Reviewed: I have personally reviewed following labs and imaging studies  CBC: Recent Labs  Lab 06/20/23 1503 06/21/23 0350 06/22/23 0353 06/24/23 0417 06/25/23 0826 06/26/23 0352  WBC 12.7* 10.1 11.3* 11.8* 12.3* 13.3*  NEUTROABS 11.5*  --  9.5* 9.3* 9.7* 10.5*  HGB 14.2 11.7* 11.3* 12.3* 13.1 12.3*  HCT 43.7 35.7* 34.3* 37.4* 40.7 38.1*  MCV 96.3 95.2 96.9 93.7 95.8 96.5  PLT 227 196 192 257 297 310   Basic Metabolic Panel: Recent Labs  Lab 06/21/23 0350 06/21/23 1634 06/22/23 0353 06/22/23 1726 06/23/23 0332 06/24/23 0417 06/25/23 0826 06/26/23 0352  NA 140  --  137  --   --  140 138 139  K 3.8  --  3.4*  --   --  3.9 4.0 3.9  CL 107  --  102  --   --  107 103 106  CO2 25  --  26  --   --  23 28 24   GLUCOSE 135*  --  129*  --   --  120* 111* 129*  BUN 23  --  15  --   --  20 20 23   CREATININE 0.69  --  0.68  --   --  0.57* 0.57* 0.58*  CALCIUM 8.2*  --   8.0*  --   --  8.4* 8.5* 8.3*  MG  --    < > 1.7 1.8 1.8 1.9 1.9 2.0  PHOS  --    < > 3.5 3.7 4.1 3.6 3.9 3.8   < > = values in this interval not displayed.   GFR: Estimated Creatinine Clearance: 64.2 mL/min (A) (by C-G formula based on SCr of 0.58 mg/dL (L)). Liver Function Tests: Recent Labs  Lab 06/20/23 1503 06/22/23 0353 06/24/23 0417 06/25/23 0826 06/26/23 0352  AST 25 26 33  --  41  ALT 25 26 40  --  71*  ALKPHOS 67 56 68  --  66  BILITOT 0.8 0.7 0.6  --  0.3  PROT 7.5 5.5* 6.0*  --  6.4*  ALBUMIN  3.4* 2.3* 2.4* 2.5* 2.4*   No results for input(s): "LIPASE", "AMYLASE" in the last 168 hours. No results for input(s): "AMMONIA" in the last 168 hours. Coagulation Profile: Recent Labs  Lab 06/20/23 1503 06/21/23 0350  INR 1.2 1.3*   Cardiac Enzymes: No results for input(s): "CKTOTAL", "CKMB", "CKMBINDEX", "TROPONINI" in the last 168 hours. BNP (last 3 results) Recent Labs    05/23/23 0940  PROBNP 20.0   HbA1C: No results for input(s): "HGBA1C" in the last 72 hours. CBG: Recent Labs  Lab 06/25/23 1634 06/25/23 2002 06/25/23 2349 06/26/23 0341 06/26/23 0734  GLUCAP 116* 122* 136* 140* 128*   Lipid Profile: No results for input(s): "CHOL", "HDL", "LDLCALC", "TRIG", "CHOLHDL", "LDLDIRECT" in the last 72 hours. Thyroid  Function Tests: No results for input(s): "TSH", "T4TOTAL", "FREET4", "T3FREE", "THYROIDAB" in the last 72 hours. Anemia Panel: No results for input(s): "VITAMINB12", "FOLATE", "FERRITIN", "TIBC", "IRON", "RETICCTPCT" in the last 72 hours. Sepsis Labs: Recent Labs  Lab 06/20/23 1602 06/20/23 1713 06/21/23 1634 06/22/23 1726 06/24/23 0417  PROCALCITON  --   --  4.13 2.82 0.97  LATICACIDVEN 2.9* 2.0* 1.6  --   --     Recent Results (from the past 240 hours)  Blood Culture (routine x 2)     Status: None   Collection Time: 06/20/23  3:20 PM   Specimen: BLOOD LEFT FOREARM  Result Value Ref Range Status   Specimen Description   Final     BLOOD LEFT FOREARM Performed at [facility], [address] [place], [place] [address]    Special Requests   Final    BOTTLES DRAWN AEROBIC AND ANAEROBIC Blood Culture adequate volume Performed at [facility], [address] [place], [place] [address]    Culture   Final    NO GROWTH 5 DAYS Performed at [facility], [address] [place], [place] [address]    Report Status 06/25/2023 FINAL  Final  Blood Culture (routine x 2)     Status: None   Collection Time: 06/20/23  3:30 PM   Specimen: BLOOD RIGHT FOREARM  Result Value Ref Range Status   Specimen Description   Final    BLOOD RIGHT FOREARM Performed at [facility], [address], [place], [place] [address]    Special Requests   Final    BOTTLES DRAWN AEROBIC AND ANAEROBIC Blood Culture adequate volume Performed at [facility], [address], [place], [place] [address]    Culture   Final    NO GROWTH 5 DAYS Performed at [facility], [address], [place], [place] [address]    Report Status 06/25/2023 FINAL  Final  Resp panel by RT-PCR (RSV, Flu A&B, Covid) Anterior Nasal Swab     Status: None   Collection Time: 06/20/23  3:35 PM   Specimen: Anterior Nasal Swab  Result Value Ref Range Status   SARS Coronavirus 2 by RT PCR NEGATIVE NEGATIVE Final    Comment: (NOTE) SARS-CoV-2 target nucleic acids are NOT DETECTED.  The SARS-CoV-2 RNA is generally detectable in upper respiratory specimens during the acute phase of infection. The lowest concentration of SARS-CoV-2 viral copies this assay can detect is 138 copies/mL. A negative result does not preclude SARS-Cov-2 infection and should not be used as the sole basis for treatment or other patient management decisions. A negative result may occur with  improper specimen collection/handling, submission of specimen other than nasopharyngeal swab, presence of viral mutation(s) within the areas targeted by  this assay, and inadequate number of viral copies(<138 copies/mL). A negative result must be combined with clinical observations, patient history, and epidemiological information. The expected result is Negative.  Fact Sheet for  Patients:  BloggerCourse.com  Fact Sheet for Healthcare Providers:  SeriousBroker.it  This test is no t yet approved or cleared by the [place]  FDA and  has been authorized for detection and/or diagnosis of SARS-CoV-2 by FDA under an Emergency Use Authorization (EUA). This EUA will remain  in effect (meaning this test can be used) for the duration of the COVID-19 declaration under Section 564(b)(1) of the Act, 21 U.S.C.section 360bbb-3(b)(1), unless the authorization is terminated  or revoked sooner.       Influenza A by PCR NEGATIVE NEGATIVE Final   Influenza B by PCR NEGATIVE NEGATIVE Final    Comment: (NOTE) The Xpert Xpress SARS-CoV-2/FLU/RSV plus assay is intended as an aid in the diagnosis of influenza from Nasopharyngeal swab specimens and should not be used as a sole basis for treatment. Nasal washings and aspirates are unacceptable for Xpert Xpress SARS-CoV-2/FLU/RSV testing.  Fact Sheet for Patients: BloggerCourse.com  Fact Sheet for Healthcare Providers: SeriousBroker.it  This test is not yet approved or cleared by the [place]  FDA and has been authorized for detection and/or diagnosis of SARS-CoV-2 by FDA under an Emergency Use Authorization (EUA). This EUA will remain in effect (meaning this test can be used) for the duration of the COVID-19 declaration under Section 564(b)(1) of the Act, 21 U.S.C. section 360bbb-3(b)(1), unless the authorization is terminated or revoked.     Resp Syncytial Virus by PCR NEGATIVE NEGATIVE Final    Comment: (NOTE) Fact Sheet for Patients: BloggerCourse.com  Fact Sheet  for Healthcare Providers: SeriousBroker.it  This test is not yet approved or cleared by the [place]  FDA and has been authorized for detection and/or diagnosis of SARS-CoV-2 by FDA under an Emergency Use Authorization (EUA). This EUA will remain in effect (meaning this test can be used) for the duration of the COVID-19 declaration under Section 564(b)(1) of the Act, 21 U.S.C. section 360bbb-3(b)(1), unless the authorization is terminated or revoked.  Performed at [facility], [address] [place], [place] [address]   MRSA Next Gen by PCR, Nasal     Status: Abnormal   Collection Time: 06/21/23  4:27 PM   Specimen: Urine, Clean Catch; Nasal Swab  Result Value Ref Range Status   MRSA by PCR Next Gen DETECTED (A) NOT DETECTED Final    Comment: (NOTE) The GeneXpert MRSA Assay (FDA approved for NASAL specimens only), is one component of a comprehensive MRSA colonization surveillance program. It is not intended to diagnose MRSA infection nor to guide or monitor treatment for MRSA infections. Test performance is not FDA approved in patients less than 72 years old. Performed at [facility], [address] [place], [place] [address]   Respiratory (~20 pathogens) panel by PCR     Status: Abnormal   Collection Time: 06/21/23  4:29 PM   Specimen: Nasopharyngeal Swab; Respiratory  Result Value Ref Range Status   Adenovirus NOT DETECTED NOT DETECTED Final   Coronavirus 229E NOT DETECTED NOT DETECTED Final    Comment: (NOTE) The Coronavirus on the Respiratory Panel, DOES NOT test for the novel  Coronavirus (2019 nCoV)    Coronavirus HKU1 NOT DETECTED NOT DETECTED Final   Coronavirus NL63 NOT DETECTED NOT DETECTED Final   Coronavirus OC43 NOT DETECTED NOT DETECTED Final   Metapneumovirus NOT DETECTED NOT DETECTED Final   Rhinovirus / Enterovirus DETECTED (A) NOT DETECTED Final   Influenza A NOT DETECTED NOT DETECTED  Final   Influenza B NOT DETECTED NOT DETECTED Final  Parainfluenza Virus 1 NOT DETECTED NOT DETECTED Final   Parainfluenza Virus 2 NOT DETECTED NOT DETECTED Final   Parainfluenza Virus 3 NOT DETECTED NOT DETECTED Final   Parainfluenza Virus 4 NOT DETECTED NOT DETECTED Final   Respiratory Syncytial Virus NOT DETECTED NOT DETECTED Final   Bordetella pertussis NOT DETECTED NOT DETECTED Final   Bordetella Parapertussis NOT DETECTED NOT DETECTED Final   Chlamydophila pneumoniae NOT DETECTED NOT DETECTED Final   Mycoplasma pneumoniae NOT DETECTED NOT DETECTED Final    Comment: Performed at [facility], [address] [place], [place] [address]  Expectorated Sputum Assessment w Gram Stain, Rflx to Resp Cult     Status: None   Collection Time: 06/23/23  9:00 PM   Specimen: Sputum  Result Value Ref Range Status   Specimen Description SPUTUM  Final   Special Requests NONE  Final   Sputum evaluation   Final    Sputum specimen not acceptable for testing.  Please recollect.   Performed at [facility], [address], [place], [place] [address]    Report Status 06/23/2023 FINAL  Final         Radiology Studies: No results found.      Scheduled Meds:  enoxaparin  (LOVENOX ) injection  40 mg Subcutaneous Q24H   feeding supplement (PROSource TF20)  60 mL Per Tube Daily   free water   150 mL Per Tube Q4H   mouth rinse  15 mL Mouth Rinse 4 times per day   saccharomyces boulardii  250 mg Per Tube BID   sodium chloride   3 mL Nebulization TID   sodium chloride  flush  3 mL Intravenous Q12H   Continuous Infusions:  ampicillin -sulbactam (UNASYN ) IV 3 g (06/26/23 0526)   feeding supplement (JEVITY 1.5 CAL/FIBER) 1,000 mL (06/25/23 2022)   vancomycin  750 mg (06/26/23 0105)     LOS: 6 days    Time spent: 50 minutes spent on 06/26/2023 caring for this patient face-to-face including chart review, ordering labs/tests, documenting, discussion with nursing staff,  consultants, updating family and interview/physical exam    Rema Care [place], DO [facility] Available via Epic secure chat 7am-7pm After these hours, please refer to coverage provider listed on [email or web address] 06/26/2023, 11:13 AM

## 2023-06-26 NOTE — Progress Notes (Signed)
 Nutrition Follow-up  DOCUMENTATION CODES:   Severe malnutrition in context of chronic illness  INTERVENTION:  - Continue current TF regimen via PEG: Jevity 1.5 at 60 ml/h (1440 ml per day) ProSource TF20 60 ml once daily FWF Q4H Provides 2240 kcal, 112 gm protein, 1994 ml free water  daily   - Monitor weight trends.  NUTRITION DIAGNOSIS:   Severe Malnutrition related to chronic illness (ALS; dysphagia) as evidenced by severe fat depletion, severe muscle depletion, percent weight loss (19% weight loss in ~2 months). *ongoing  GOAL:   Patient will meet greater than or equal to 90% of their needs *met with TF  MONITOR:   Labs, Weight trends, TF tolerance  REASON FOR ASSESSMENT:   Consult Assessment of nutrition requirement/status, Enteral/tube feeding initiation and management  ASSESSMENT:   73 y.o. male with PMH significant of ALS, tongue palsy with inability to talk/speak, PEG tube feeding (placed Jan 2025) who presented with a 7 day history of new onset of cough and fever. Admitted for PNA.  Patient working with another provider at time of visit. He remains on tube feeds at continuous goal of 11mL/hr. Per MD notes, patient reports his dyspnea is improved but he continues to have a nonproductive cough. Will plan to keep patient on continuous feeds for now given ongoing respiratory issues.    Admit weight: 125# Current weight: 123# I&O's: +7.2L  Medications reviewed and include: 150mL Q4H FWF  Labs reviewed: -   Diet Order:   Diet Order             Diet NPO time specified  Diet effective now                   EDUCATION NEEDS:  Education needs have been addressed  Skin:  Skin Assessment: Reviewed RN Assessment  Last BM:  5/5 - type 5  Height:  Ht Readings from Last 1 Encounters:  06/20/23 5\' 8"  (1.727 m)   Weight:  Wt Readings from Last 1 Encounters:  06/26/23 56 kg    BMI:  Body mass index is 18.78 kg/m.  Estimated Nutritional  Needs:  Kcal:  2000-2150 kcals Protein:  85-105 grams Fluid:  >/= 2L    Scheryl Cushing RD, LDN Contact via Secure Chat.

## 2023-06-26 NOTE — Progress Notes (Signed)
 NAME:  Guy Payne, MRN:  [id number], DOB:  21-Dec-1950, LOS: 6 ADMISSION DATE:  06/20/2023, CONSULTATION DATE:  06/26/23 REFERRING MD:  Lanette Pipe, CHIEF COMPLAINT:  pneumonia   History of Present Illness:  Guy Payne is a 73 y.o. M with PMH significant for ALS and tongue palsy with PEG who presented to the ER with several days of productive cough and fever and was found to have basilar pneumonia with viral panel positive for rhinovirus.  He is followed at the ALS clinic through [facility] and spouse was inquiring whether pt meets criteria for home NIV.   Latest Reference Range & Units 06/20/23 15:03  pH, Ven 7.25 - 7.43  7.5 (H)  pCO2, Ven 44 - 60 mmHg 36 (L)  pO2, Ven 32 - 45 mmHg 59 (H)  (H): Data is abnormally high (L): Data is abnormally low  Pertinent  Medical History   has a past medical history of ALS (amyotrophic lateral sclerosis) (HCC), Fall (2021), MCI (mild cognitive impairment), Memory loss, Prostate disorder, and Restless leg.   Significant Hospital Events: Including procedures, antibiotic start and stop dates in addition to other pertinent events   4/30 admit with viral pneumonia  - rHINOVIRUS an dMRSA PCR + CT dramatic chagne sine 05/31/23 when it was clear PRocal 4.13 5.3 - procal improved to 0.97 5/4 - Feels about the same since admission. Still coughing, not able to expectorate his secretions. Oxygen decreased to 3L.   Interim History / Subjective:    5/5- wife at bedside. STates and documetnation he was able to walk on 2L [place] but now sitting on 4L [place] and pulse ox 97%. Taptered down to 2L [place]. Also documented as 92% RA rest . Afbrile since 4/30/25PM   Objective   Blood pressure 114/60, pulse 76, temperature 98.8 F (37.1 C), temperature source Oral, resp. rate (!) 22, height 5\' 8"  (1.727 m), weight 56 kg, SpO2 96%.    FiO2 (%):  [32 %] 32 %   Intake/Output Summary (Last 24 hours) at 06/26/2023 0953 Last data filed at 06/26/2023 0749 Gross per 24 hour  Intake 0 ml   Output --  Net 0 ml   Filed Weights   06/24/23 0427 06/25/23 0500 06/26/23 0500  Weight: 55.2 kg 55.7 kg 56 kg  General: No distress. Neuro muscular wasting at bseline. Using iPAP and shaking hands but clumsily Neuro: Alert and Oriented x 3. GCS 15. Speech weak. No fine motor movement Psych: Pleasant Resp:  Barrel Chest - no.  Wheeze - no, Crackles - no, No overt respiratory distress CVS: Normal heart sounds. Murmurs - no Ext: Stigmata of Connective Tissue Disease - no HEENT: Normal upper airway. PEERL +. No post nasal drip   LABS    PULMONARY Recent Labs  Lab 06/20/23 1503  HCO3 28.1*  O2SAT 93.8    CBC Recent Labs  Lab 06/24/23 0417 06/25/23 0826 06/26/23 0352  HGB 12.3* 13.1 12.3*  HCT 37.4* 40.7 38.1*  WBC 11.8* 12.3* 13.3*  PLT 257 297 310    COAGULATION Recent Labs  Lab 06/20/23 1503 06/21/23 0350  INR 1.2 1.3*    CARDIAC  No results for input(s): "TROPONINI" in the last 168 hours. No results for input(s): "PROBNP" in the last 168 hours.   CHEMISTRY Recent Labs  Lab 06/21/23 0350 06/21/23 1634 06/22/23 0353 06/22/23 1726 06/23/23 0332 06/24/23 0417 06/25/23 0826 06/26/23 0352  NA 140  --  137  --   --  140 138 139  K  3.8  --  3.4*  --   --  3.9 4.0 3.9  CL 107  --  102  --   --  107 103 106  CO2 25  --  26  --   --  23 28 24   GLUCOSE 135*  --  129*  --   --  120* 111* 129*  BUN 23  --  15  --   --  20 20 23   CREATININE 0.69  --  0.68  --   --  0.57* 0.57* 0.58*  CALCIUM 8.2*  --  8.0*  --   --  8.4* 8.5* 8.3*  MG  --    < > 1.7 1.8 1.8 1.9 1.9 2.0  PHOS  --    < > 3.5 3.7 4.1 3.6 3.9 3.8   < > = values in this interval not displayed.   Estimated Creatinine Clearance: 64.2 mL/min (A) (by C-G formula based on SCr of 0.58 mg/dL (L)).   LIVER Recent Labs  Lab 06/20/23 1503 06/21/23 0350 06/22/23 0353 06/24/23 0417 06/25/23 0826 06/26/23 0352  AST 25  --  26 33  --  41  ALT 25  --  26 40  --  71*  ALKPHOS 67  --  56 68  --  66   BILITOT 0.8  --  0.7 0.6  --  0.3  PROT 7.5  --  5.5* 6.0*  --  6.4*  ALBUMIN  3.4*  --  2.3* 2.4* 2.5* 2.4*  INR 1.2 1.3*  --   --   --   --      INFECTIOUS Recent Labs  Lab 06/20/23 1602 06/20/23 1713 06/21/23 1634 06/22/23 1726 06/24/23 0417  LATICACIDVEN 2.9* 2.0* 1.6  --   --   PROCALCITON  --   --  4.13 2.82 0.97     ENDOCRINE CBG (last 3)  Recent Labs    06/25/23 2349 06/26/23 0341 06/26/23 0734  GLUCAP 136* 140* 128*         IMAGING x48h  - image(s) personally visualized  -   highlighted in bold No results found.   Resolved Hospital Problem list     Assessment & Plan:   Sepsis and acute hypoxic respiratory failure due to viral pneumonia in the setting of ALS Small left parapneumonic effusion on left -last cx 06/24/23  5/5  - slowly better   Plan  - check BNP, ESR and Procal 06/27/23 -  - Aim to dc home 06/27/23 with or without o2  -effusion likely too small to tap, but can consider if it enlarges -con't coughing for good pulmonary hygiene -wean O2 as able -needs OP follow up imaging to ensure resolution of opacities 6-8 weeks after infection. -mobility as able -con't home cough assist regimen, getting CPT & hypertonic saline nebs -can NTS if needed -con't CPT and hypertonic saline to ensure he has adequate assistance for  -empiric antibiotics \  - [facility] to taper antibioptics 06/26/2023  -Pt did not meet criteria for NIV at home per last [facility] note, given his acute infection, it is difficult to assess if pt meets criteria for long term NIV while hospitalized. No hypercapnia on admission blood gas, suggesting he does not need home NIV at present. Will need ongoing close follow up with ALS clinic as an outpatient.       Best practice:  Per [facility]  FOLLOWUP Future Appointments  Date Time Provider Department Center  07/21/2023  8:45 AM Maire Scot,  MD [facility] None     SIGNATURE    Dr. Maire Scot, M.D., F.C.C.P,  Pulmonary  and Critical Care Medicine Staff Physician, [facility] Center Director - Interstitial Lung Disease  Program  Pulmonary Fibrosis [facility] at [facility] [place], [place], [address]   Pager: [phone], If no answer  -> Check AMION or Try [phone] Telephone (clinical office): [phone] Telephone (research): [phone]  9:53 AM 06/26/2023

## 2023-06-26 NOTE — Evaluation (Signed)
 Physical Therapy Evaluation Patient Details Name: Guy Payne MRN: [id number] DOB: [birth year] Today's Date: 06/26/2023  History of Present Illness  73 yo male admitted with Pna. 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 He walked ~200 feet with a RW. Upon my arrival, pt was on 4L O2 at rest-sats reading 97%. Removed [place] O2-sats remained around 92% on RA at rest. Ambulated on 2L  O2-sats 94%. Increased RR with activity. Will continue to follow and assess O2 needs with activity. Made RN aware. Wife wondering why pt is on 4L at rest and would like his O2 titrated down if possible. Recommend pt resume HHPT after discharge.         If plan is discharge home, recommend the following: A little help with walking and/or transfers;A little help with bathing/dressing/bathroom;Assistance with cooking/housework;Assist for transportation;Help with stairs or ramp for entrance   Can travel by private vehicle        Equipment Recommendations None recommended by PT  Recommendations for Other Services       Functional Status Assessment Patient has had a recent decline in their functional status and demonstrates the ability to make significant improvements in function in a reasonable and predictable amount of time.     Precautions / Restrictions Precautions Precautions: Fall Precaution/Restrictions Comments: monitor O2; PEG Restrictions Weight Bearing Restrictions Per Provider Order: No      Mobility  Bed Mobility Overal bed mobility: Needs Assistance Bed Mobility: Supine to Sit     Supine to sit: Supervision, HOB elevated     General bed mobility comments: for safety, lines    Transfers Overall transfer level: Needs assistance Equipment used: Rolling walker (2 wheels) Transfers: Sit to/from Stand Sit to Stand: Contact guard assist, From elevated surface           General transfer comment:  Cues for safety, hand placement and for pt to try to do as much as he can for himself before asking for help    Ambulation/Gait Ambulation/Gait assistance: Supervision Gait Distance (Feet): 200 Feet Assistive device: Rolling walker (2 wheels) Gait Pattern/deviations: Step-through pattern, Decreased stride length       General Gait Details: O2 94% on 2L, increased RR. Tolerated distance well. No LOB with RW use.  Stairs            Wheelchair Mobility     Tilt Bed    Modified Rankin (Stroke Patients Only)       Balance Overall balance assessment: Needs assistance, History of Falls         Standing balance support: During functional activity, Reliant on assistive device for balance Standing balance-Leahy Scale: Fair                               Pertinent Vitals/Pain Pain Assessment Pain Assessment: No/denies pain    Home Living Family/patient expects to be discharged to:: Private residence Living Arrangements: Spouse/significant other Available Help at Discharge: Personal care attendant;Available 24 hours/day;Family Type of Home: House           Home Equipment: Agricultural consultant (2 wheels);Rollator (4 wheels);Wheelchair - power      Prior Function Prior Level of Function : Needs assist             Mobility Comments: uses RW ADLs Comments: assist into shower. assist washng back.  Extremity/Trunk Assessment   Upper Extremity Assessment Upper Extremity Assessment: Overall WFL for tasks assessed    Lower Extremity Assessment Lower Extremity Assessment: Generalized weakness    Cervical / Trunk Assessment Cervical / Trunk Assessment: Normal  Communication   Communication Communication: No apparent difficulties Factors Affecting Communication: Difficulty expressing self    Cognition Arousal: Alert Behavior During Therapy: WFL for tasks assessed/performed   PT - Cognitive impairments: Difficult to assess Difficult to assess  due to: Impaired communication                       Following commands: Intact       Cueing Cueing Techniques: Verbal cues     General Comments      Exercises     Assessment/Plan    PT Assessment Patient needs continued PT services  PT Problem List Decreased strength;Decreased range of motion;Decreased activity tolerance;Decreased balance;Decreased mobility;Decreased knowledge of use of DME       PT Treatment Interventions DME instruction;Gait training;Functional mobility training;Therapeutic activities;Therapeutic exercise;Patient/family education;Balance training    PT Goals (Current goals can be found in the Care Plan section)  Acute Rehab PT Goals Patient Stated Goal: home without O2 PT Goal Formulation: With family Time For Goal Achievement: 07/10/23 Potential to Achieve Goals: Good    Frequency Min 3X/week     Co-evaluation               AM-PAC PT "6 Clicks" Mobility  Outcome Measure Help needed turning from your back to your side while in a flat bed without using bedrails?: None Help needed moving from lying on your back to sitting on the side of a flat bed without using bedrails?: None Help needed moving to and from a bed to a chair (including a wheelchair)?: A Little Help needed standing up from a chair using your arms (e.g., wheelchair or bedside chair)?: A Little Help needed to walk in hospital room?: A Little Help needed climbing 3-5 steps with a railing? : A Little 6 Click Score: 20    End of Session Equipment Utilized During Treatment: Oxygen;Gait belt Activity Tolerance: Patient tolerated treatment well Patient left: in chair;with call bell/phone within reach;with family/visitor present   PT Visit Diagnosis: History of falling (Z91.81);Muscle weakness (generalized) (M62.81);Difficulty in walking, not elsewhere classified (R26.2)    Time: 1610-9604 PT Time Calculation (min) (ACUTE ONLY): 32 min   Charges:   PT Evaluation $PT  Eval Low Complexity: 1 Low PT Treatments $Gait Training: 8-22 mins PT General Charges $$ ACUTE PT VISIT: 1 Visit            Tanda Falter, PT Acute Rehabilitation  Office: [phone]

## 2023-06-27 ENCOUNTER — Other Ambulatory Visit (HOSPITAL_COMMUNITY): Payer: Self-pay

## 2023-06-27 ENCOUNTER — Other Ambulatory Visit: Payer: Self-pay

## 2023-06-27 DIAGNOSIS — J189 Pneumonia, unspecified organism: Secondary | ICD-10-CM | POA: Diagnosis not present

## 2023-06-27 LAB — CBC
HCT: 37.7 % — ABNORMAL LOW (ref 39.0–52.0)
Hemoglobin: 12.6 g/dL — ABNORMAL LOW (ref 13.0–17.0)
MCH: 31.5 pg (ref 26.0–34.0)
MCHC: 33.4 g/dL (ref 30.0–36.0)
MCV: 94.3 fL (ref 80.0–100.0)
Platelets: 344 10*3/uL (ref 150–400)
RBC: 4 MIL/uL — ABNORMAL LOW (ref 4.22–5.81)
RDW: 12.5 % (ref 11.5–15.5)
WBC: 10.1 10*3/uL (ref 4.0–10.5)
nRBC: 0 % (ref 0.0–0.2)

## 2023-06-27 LAB — SEDIMENTATION RATE: Sed Rate: 56 mm/h — ABNORMAL HIGH (ref 0–16)

## 2023-06-27 LAB — GLUCOSE, CAPILLARY
Glucose-Capillary: 107 mg/dL — ABNORMAL HIGH (ref 70–99)
Glucose-Capillary: 114 mg/dL — ABNORMAL HIGH (ref 70–99)
Glucose-Capillary: 94 mg/dL (ref 70–99)

## 2023-06-27 LAB — BRAIN NATRIURETIC PEPTIDE: B Natriuretic Peptide: 20.1 pg/mL (ref 0.0–100.0)

## 2023-06-27 LAB — CREATININE, SERUM
Creatinine, Ser: 0.57 mg/dL — ABNORMAL LOW (ref 0.61–1.24)
GFR, Estimated: >60 mL/min (ref >60)

## 2023-06-27 LAB — PROCALCITONIN: Procalcitonin: 0.2 ng/mL

## 2023-06-27 MED ORDER — SACCHAROMYCES BOULARDII 250 MG PO CAPS
250.0000 mg | ORAL_CAPSULE | Freq: Two times a day (BID) | ORAL | 0 refills | Status: DC
Start: 2023-06-27 — End: 2023-06-27
  Filled 2023-06-27: qty 40, 20d supply, fill #0
  Filled 2023-06-27: qty 20, 10d supply, fill #0

## 2023-06-27 MED ORDER — SACCHAROMYCES BOULARDII 250 MG PO CAPS: 250.0000 mg | ORAL_CAPSULE | Freq: Two times a day (BID) | ORAL | 0 refills | Status: AC

## 2023-06-27 NOTE — Progress Notes (Signed)
 Patient ambulated on room air, o2 sat between 90-93%. At rest on room air, o2 is 93-95%.

## 2023-06-27 NOTE — Discharge Summary (Signed)
 Physician Discharge Summary  Guy Payne QIO:[id number] DOB: [birth year] DOA: 06/20/2023  PCP: Mordechai April, DO  Admit date: 06/20/2023 Discharge date: 06/27/2023  Admitted From: Home Disposition: Home with home health  Recommendations for Outpatient Follow-up:  Follow up with PCP in 1-2 weeks Consider outpatient ultrasound for incidental finding of renal cyst noted on inpatient imaging  Home Health: Resumption of home health RN, SLP, PT/OT Equipment/Devices: None  Discharge Condition: Stable CODE STATUS: Full code Diet recommendation: Resume tube feeds  History of present illness:  Guy Payne is a 73 y.o. 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   Upon evaluation in the emergency department patient was found to have multiple SIRS criteria concerning for sepsis with chest x-ray revealing bibasilar pneumonia.  Patient was started on broad-spectrum intravenous antibiotics as well as supplemental oxygen and the hospitalist group consulted for admission.  Hospital course:  Sepsis, POA Acute hypoxic respiratory failure, POA Pneumonia of both lower lobes due to infectious organism Patient presenting to ED with recurrent fever, cough complicated by his underlying ALS.  WBC elevated 12.2 with temperature 9 9.8 F on admission. Chest x-ray with bibasilar airspace disease consistent with pneumonia versus aspiration versus edema.  CT chest/abdomen/pelvis with extensive bibasilar airspace disease compatible with infection versus aspiration.  TTE with LVEF 65 to 70%, LV with normal function with no regional wall motion normalities, grade 1 diastolic dysfunction, no aortic stenosis.  Urine Legionella/strep pneumo  antigen negative. MRSA PCR postiive. Respiratory viral panel positive for rhinovirus/enterovirus.  Blood cultures x 2 with no growth x 5 days.  Sputum culture with sample not acceptable for testing.  Completed 3-day course of azithromycin ; and completed 7-day course of IV vancomycin  and Unasyn .  Supplemental oxygen was slowly weaned off and no desaturation was noted on ambulation. 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  Nonverbal.  Patient is able to walk however with assistance using an assistive device. Patient follows with Dr. Annamarie Barrier with neurology at [facility]. Aspiration precautions, outpatient follow-up with ALS clinic   Renal cyst Incidental finding on CT . Outpatient eval with ultrasound if desired   Protein-calorie malnutrition, severe Status post recent placement of PEG tube in January at [facility]. Poor muscle tone with temporal wasting, BMI 18.5. Albumin  currently 2.4 on 5/3.  Continue tube feeds.   Discharge Diagnoses:  Principal Problem:   Pneumonia of both lower lobes due to infectious organism Active Problems:   Renal cyst   ALS (amyotrophic lateral sclerosis) (HCC)   Protein-calorie malnutrition, severe   Sepsis due to Streptococcus pneumoniae with acute hypoxic respiratory failure [facility])    Discharge Instructions  Discharge Instructions     Call MD for:  difficulty breathing, headache or visual disturbances   Complete by: As directed    Call MD for:  extreme fatigue   Complete by: As directed    Call MD for:  persistant dizziness or light-headedness   Complete by: As directed    Call MD for:  persistant nausea and vomiting  Complete by: As directed    Call MD for:  severe uncontrolled  pain   Complete by: As directed    Call MD for:  temperature >100.4   Complete by: As directed    Diet - low sodium heart healthy   Complete by: As directed    Increase activity slowly   Complete by: As directed       Allergies as of 06/27/2023   No Known Allergies      Medication List     STOP taking these medications    predniSONE  10 MG tablet Commonly known as: DELTASONE    Rosuvastatin Calcium 10 MG Cpsp   scopolamine 1 MG/3DAYS Commonly known as: TRANSDERM-SCOP       TAKE these medications    acetaminophen  500 MG tablet Commonly known as: TYLENOL  Take 500-1,000 mg by mouth every 6 (six) hours as needed for moderate pain (pain score 4-6).   donepezil  10 MG tablet Commonly known as: [name]  TAKE 1 TABLET BY MOUTH EVERYDAY AT BEDTIME   glycopyrrolate  1 MG tablet Commonly known as: ROBINUL  Take 1-2 mg by mouth See admin instructions. Take 1mg  (1 tablet) by mouth in the mornings and 2mg  (2 tablets) in the evening.   guaifenesin  100 MG/5ML syrup Commonly known as: ROBITUSSIN Take 200 mg by mouth 3 (three) times daily as needed for cough.   ipratropium-albuterol  0.5-2.5 (3) MG/3ML Soln Commonly known as: DUONEB Take 3 mLs by nebulization every 6 (six) hours as needed. What changed: when to take this   loratadine 10 MG tablet Commonly known as: CLARITIN Take 10 mg by mouth daily as needed for allergies.   saccharomyces boulardii 250 MG capsule Commonly known as: FLORASTOR Place 1 capsule (250 mg total) into feeding tube 2 (two) times daily for 14 days.        No Known Allergies  Consultations: PCCM   Procedures/Studies: DG Chest 1 View Result Date: 06/24/2023 CLINICAL DATA:  Pneumonia. EXAM: CHEST  1 VIEW COMPARISON:  06/20/2023 FINDINGS: Persistent bibasilar airspace disease, apparently progressive in the interval with more confluent opacity on the current study. Small left pleural effusion is new in the interval. The cardio pericardial silhouette  is enlarged. No acute bony abnormality. IMPRESSION: Progressive bibasilar airspace disease with new small left pleural effusion. Imaging features compatible with progressive pneumonia. Electronically Signed   By: Donnal Fusi M.D.   On: 06/24/2023 09:41   CT CHEST ABDOMEN PELVIS WO CONTRAST Result Date: 06/20/2023 CLINICAL DATA:  Sepsis, fever, cough, et al as Laurita Porta: CT CHEST, ABDOMEN AND PELVIS WITHOUT CONTRAST TECHNIQUE: Multidetector CT imaging of the chest, abdomen and pelvis was performed following the standard protocol without IV contrast. RADIATION DOSE REDUCTION: This exam was performed according to the departmental dose-optimization program which includes automated exposure control, adjustment of the mA and/or kV according to patient size and/or use of iterative reconstruction technique. COMPARISON:  05/31/2023 FINDINGS: CT CHEST FINDINGS Cardiovascular: Unenhanced imaging of the heart is unremarkable without pericardial effusion. Normal caliber of the thoracic aorta. Atherosclerosis of the aorta and coronary vasculature. Assessment of the vascular lumen cannot be performed without IV contrast. Mediastinum/Nodes: No enlarged mediastinal, hilar, or axillary lymph nodes. Thyroid  gland, trachea, and esophagus demonstrate no significant findings. Lungs/Pleura: Extensive bibasilar airspace disease is identified, greatest in the left lower lobe, compatible with sequela of infection or aspiration. No effusion or pneumothorax. Central airways are patent. Musculoskeletal: No acute or destructive bony abnormalities. Reconstructed images demonstrate no additional findings. CT ABDOMEN PELVIS FINDINGS Hepatobiliary: Unremarkable unenhanced  appearance of the liver and gallbladder. Pancreas: Unremarkable unenhanced appearance. Spleen: Unremarkable unenhanced appearance. Adrenals/Urinary Tract: There is a 7 mm left renal cyst, with minimal mural calcification measuring 2 mm, favoring benign cyst. No urinary tract  calculi or obstructive uropathy. The adrenals and bladder are unremarkable. Stomach/Bowel: No bowel obstruction or ileus. Normal appendix right lower quadrant. No bowel wall thickening or inflammatory change. Percutaneous gastrostomy tube balloon inflated within the gastric antrum, without evidence of acute complication. Vascular/Lymphatic: Aortic atherosclerosis. No enlarged abdominal or pelvic lymph nodes. Reproductive: Prior TURP. 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 Other: No free fluid or free intraperitoneal gas. No abdominal wall hernia. Musculoskeletal: No acute or destructive bony abnormalities. Reconstructed images demonstrate no additional findings. IMPRESSION: 1. Extensive bibasilar airspace disease compatible with infection or aspiration. 2. Minimally complex left renal cyst, likely benign. If further evaluation is desired, nonemergent outpatient renal ultrasound could be considered. 3. No acute intra-abdominal or intrapelvic process. 4.  Aortic Atherosclerosis (ICD10-I70.0). Electronically Signed   By: Bobbye Burrow M.D.   On: 06/20/2023 16:30   DG Chest Port 1 View Result Date: 06/20/2023 CLINICAL DATA:  Sepsis, hypoxia, ALS EXAM: PORTABLE CHEST 1 VIEW COMPARISON:  06/01/2023 FINDINGS: Single frontal view of the chest demonstrates a stable cardiac silhouette. Bibasilar airspace disease, left greater than right. No effusion or pneumothorax. No acute bony abnormalities. IMPRESSION: 1. Bibasilar airspace disease consistent with pneumonia, aspiration, or edema. Electronically Signed   By: Bobbye Burrow M.D.   On: 06/20/2023 16:23   ECHOCARDIOGRAM COMPLETE Result Date: 06/20/2023    ECHOCARDIOGRAM REPORT   Patient Name:   DALAS HARTSHORNE Date of Exam: 06/20/2023 Medical Rec #:  [id number]      Height:       68.0 in Accession #:    [id number]     Weight:       127.0 lb Date of Birth:  1950-12-26      BSA:          1.685 m Patient Age:    73 years       BP:           90/60  mmHg Patient Gender: M              HR:           107 bpm. Exam Location:  Outpatient Procedure: 2D Echo, Cardiac Doppler and Color Doppler (Both Spectral and Color            Flow Doppler were utilized during procedure). Indications:    Dyspnea  History:        Patient has no prior history of Echocardiogram examinations.                 Risk Factors:Non-Smoker. ALS.  Sonographer:    Gelene Kelly RDCS Referring Phys: [phone] [facility]  Sonographer Comments: Image acquisition challenging due to respiratory motion and patient has feeding tube needs to have elevated to 30 degrees. Patient has continuous cough throughout exam and for the past week and a half. IMPRESSIONS  1. Left ventricular ejection fraction, by estimation, is 65 to 70%. Left ventricular ejection fraction by 3D volume is 68 %. The left ventricle has normal function. The left ventricle has no regional wall motion abnormalities. Left ventricular diastolic  parameters are consistent with Grade I diastolic dysfunction (impaired relaxation).  2. Right ventricular systolic function is normal. The right ventricular size is normal.  3. The mitral valve is normal in structure. No  evidence of mitral valve regurgitation. No evidence of mitral stenosis.  4. The aortic valve is tricuspid. Aortic valve regurgitation is not visualized. No aortic stenosis is present. FINDINGS  Left Ventricle: Left ventricular ejection fraction, by estimation, is 65 to 70%. Left ventricular ejection fraction by 3D volume is 68 %. The left ventricle has normal function. The left ventricle has no regional wall motion abnormalities. The left ventricular internal cavity size was normal in size. There is no left ventricular hypertrophy. Left ventricular diastolic parameters are consistent with Grade I diastolic dysfunction (impaired relaxation). Normal left ventricular filling pressure. Right Ventricle: The right ventricular size is normal. No increase in right ventricular wall  thickness. Right ventricular systolic function is normal. Left [facility]: Left atrial size was normal in size. Right [facility]: Right atrial size was normal in size. Pericardium: There is no evidence of pericardial effusion. Mitral Valve: The mitral valve is normal in structure. No evidence of mitral valve regurgitation. No evidence of mitral valve stenosis. Tricuspid Valve: The tricuspid valve is normal in structure. Tricuspid valve regurgitation is not demonstrated. No evidence of tricuspid stenosis. Aortic Valve: The aortic valve is tricuspid. Aortic valve regurgitation is not visualized. No aortic stenosis is present. Pulmonic Valve: The pulmonic valve was normal in structure. Pulmonic valve regurgitation is not visualized. No evidence of pulmonic stenosis. Aorta: The aortic root is normal in size and structure. Venous: Not well visualized due feeding tube placement and continous cough. IAS/Shunts: No atrial level shunt detected by color flow Doppler. Additional Comments: 3D was performed not requiring image post processing on an independent workstation and was normal.  LEFT VENTRICLE PLAX 2D LVIDd:         3.54 cm LVIDs:         2.52 cm LV PW:         0.86 cm         3D Volume EF LV IVS:        1.01 cm         LV 3D EF:    Left                                             ventricul                                             ar                                             ejection                                             fraction                                             by 3D  volume is                                             68 %.                                 3D Volume EF:                                3D EF:        68 % Maudine Sos MD Electronically signed by Maudine Sos MD Signature Date/Time: 06/20/2023/1:53:04 PM    Final    CT Chest High Resolution Result Date: 06/16/2023 CLINICAL DATA:  Chronic persisting cough. EXAM: CT CHEST WITHOUT CONTRAST  TECHNIQUE: Multidetector CT imaging of the chest was performed following the standard protocol without intravenous contrast. High resolution imaging of the lungs, as well as inspiratory and expiratory imaging, was performed. RADIATION DOSE REDUCTION: This exam was performed according to the departmental dose-optimization program which includes automated exposure control, adjustment of the mA and/or kV according to patient size and/or use of iterative reconstruction technique. COMPARISON:  12/24/2009. FINDINGS: Cardiovascular: Atherosclerotic calcification of the aorta and left anterior descending coronary artery. Heart is at the upper limits of normal in size to mildly enlarged. No pericardial effusion. Mediastinum/Nodes: No pathologically enlarged mediastinal or axillary lymph nodes. Hilar regions are difficult to definitively evaluate without IV contrast. Esophagus is grossly unremarkable. Lungs/Pleura: Calcified granulomas. Negative for subpleural reticulation, traction bronchiectasis/bronchiolectasis, ground glass, architectural distortion or honeycombing. Mild bibasilar scarring. No pleural fluid. Airway is unremarkable. No air trapping. Upper Abdomen: Partially imaged low-attenuation mass off the left kidney. No specific follow-up necessary. Visualized portions of the liver, gallbladder, adrenal glands, kidneys, spleen, pancreas, stomach and bowel are otherwise grossly unremarkable. No upper abdominal adenopathy. Musculoskeletal: Degenerative changes in the spine. IMPRESSION: 1. No evidence of interstitial lung disease or air trapping. 2. Aortic atherosclerosis (ICD10-I70.0). Left anterior descending coronary artery calcification. Electronically Signed   By: Shearon Denis M.D.   On: 06/16/2023 08:39   NM Pulmonary Perfusion Result Date: 06/01/2023 CLINICAL DATA:  Dyspnea on exertion. Concern for pulmonary embolism. EXAM: NUCLEAR MEDICINE PERFUSION LUNG SCAN TECHNIQUE: Perfusion images were obtained in  multiple projections after intravenous injection of radiopharmaceutical. RADIOPHARMACEUTICALS:  4.4 mCi Tc-11m MAA COMPARISON:  radiograph 06/01/2023 FINDINGS: No wedge-shaped peripheral perfusion defect within LEFT or RIGHT lung to suggest acute pulmonary embolism. Normal perfusion pattern. IMPRESSION: No evidence acute pulmonary embolism Electronically Signed   By: Deboraha Fallow M.D.   On: 06/01/2023 10:19   DG Chest 2 View Result Date: 06/01/2023 CLINICAL DATA:  73 year old male with dyspnea on exertion EXAM: CHEST - 2 VIEW COMPARISON:  05/19/2023 FINDINGS: Cardiomediastinal silhouette unchanged in size and contour. No evidence of central vascular congestion. No interlobular septal thickening. Stigmata of emphysema, with increased retrosternal airspace, flattened hemidiaphragms, increased AP diameter, and hyperinflation on the AP view. No pneumothorax or pleural effusion. Coarsened interstitial markings, with no confluent airspace disease. No acute displaced fracture. Degenerative changes of the spine. IMPRESSION: Emphysema without evidence of acute cardiopulmonary disease Electronically Signed   By: Myrlene Asper D.O.   On: 06/01/2023 09:37     Subjective: Patient seen examined bedside, lying in bed.  Spouse present.  Planning [place].  Weaned off of Supple oxygen, ambulatory O2 screen today with no desaturation below 90% on ambulation.  Completed antibiotic treatment yesterday.  Pulmonary/critical care medicine signed off this morning.  Recommend outpatient follow-up with ALS clinic.  No other specific complaints, concerns or questions at this time.  Discharging home.  Denies headache, no dizziness, no chest pain, no palpitations, no fever/chills/night sweats, no abdominal pain, no shortness of breath.  No acute concerns overnight per nursing.  Discharge Exam: Vitals:   06/27/23 0457 06/27/23 1009  BP: (!) 106/56   Pulse: 68   Resp: 16   Temp: 98.2 F (36.8 C)   SpO2: 95% 90%   Vitals:    06/26/23 2028 06/27/23 0457 06/27/23 0500 06/27/23 1009  BP: (!) 109/53 (!) 106/56    Pulse: 82 68    Resp: 15 16    Temp: 97.8 F (36.6 C) 98.2 F (36.8 C)    TempSrc: Oral Oral    SpO2: 96% 95%  90%  Weight:   56.9 kg   Height:        Physical Exam: GEN: NAD, alert, chronically ill in appearance, sitting up in bed HEENT: NCAT, eyes anicteric PULM: Bibasilar rales, normal respiratory effort without accessory muscle use, on room air CV: RRR w/o M/G/R GI: abd soft, NTND, NABS, no R/G/M MSK: no peripheral edema NEURO: Awake, alert, moves all extremities independently but with limited coordination, unable to phonate but communicates verbally Integumentary: Warm, dry, no concerning rashes    The results of significant diagnostics from this hospitalization (including imaging, microbiology, ancillary and laboratory) are listed below for reference.     Microbiology: Recent Results (from the past 240 hours)  Blood Culture (routine x 2)     Status: None   Collection Time: 06/20/23  3:20 PM   Specimen: BLOOD LEFT FOREARM  Result Value Ref Range Status   Specimen Description   Final    BLOOD LEFT FOREARM Performed at [facility], [address], [place], [place] [address]    Special Requests   Final    BOTTLES DRAWN AEROBIC AND ANAEROBIC Blood Culture adequate volume Performed at [facility], [address] [place], [place] [address]    Culture   Final    NO GROWTH 5 DAYS Performed at [facility], [address] [place], [place] [address]    Report Status 06/25/2023 FINAL  Final  Blood Culture (routine x 2)     Status: None   Collection Time: 06/20/23  3:30 PM   Specimen: BLOOD RIGHT FOREARM  Result Value Ref Range Status   Specimen Description   Final    BLOOD RIGHT FOREARM Performed at [facility], [address] [place], [place] [address]    Special Requests   Final    BOTTLES DRAWN AEROBIC AND ANAEROBIC Blood Culture adequate  volume Performed at [facility], [address], [place], [place] [address]    Culture   Final    NO GROWTH 5 DAYS Performed at [facility], [address], [place], [place] [address]    Report Status 06/25/2023 FINAL  Final  Resp panel by RT-PCR (RSV, Flu A&B, Covid) Anterior Nasal Swab     Status: None   Collection Time: 06/20/23  3:35 PM   Specimen: Anterior Nasal Swab  Result Value Ref Range Status   SARS Coronavirus 2 by RT PCR NEGATIVE NEGATIVE Final    Comment: (NOTE) SARS-CoV-2 target nucleic acids  are NOT DETECTED.  The SARS-CoV-2 RNA is generally detectable in upper respiratory specimens during the acute phase of infection. The lowest concentration of SARS-CoV-2 viral copies this assay can detect is 138 copies/mL. A negative result does not preclude SARS-Cov-2 infection and should not be used as the sole basis for treatment or other patient management decisions. A negative result may occur with  improper specimen collection/handling, submission of specimen other than nasopharyngeal swab, presence of viral mutation(s) within the areas targeted by this assay, and inadequate number of viral copies(<138 copies/mL). A negative result must be combined with clinical observations, patient history, and epidemiological information. The expected result is Negative.  Fact Sheet for Patients:  BloggerCourse.com  Fact Sheet for Healthcare Providers:  SeriousBroker.it  This test is no t yet approved or cleared by the [place]  FDA and  has been authorized for detection and/or diagnosis of SARS-CoV-2 by FDA under an Emergency Use Authorization (EUA). This EUA will remain  in effect (meaning this test can be used) for the duration of the COVID-19 declaration under Section 564(b)(1) of the Act, 21 U.S.C.section 360bbb-3(b)(1), unless the authorization is terminated  or revoked sooner.       Influenza  A by PCR NEGATIVE NEGATIVE Final   Influenza B by PCR NEGATIVE NEGATIVE Final    Comment: (NOTE) The Xpert Xpress SARS-CoV-2/FLU/RSV plus assay is intended as an aid in the diagnosis of influenza from Nasopharyngeal swab specimens and should not be used as a sole basis for treatment. Nasal washings and aspirates are unacceptable for Xpert Xpress SARS-CoV-2/FLU/RSV testing.  Fact Sheet for Patients: BloggerCourse.com  Fact Sheet for Healthcare Providers: SeriousBroker.it  This test is not yet approved or cleared by the [place]  FDA and has been authorized for detection and/or diagnosis of SARS-CoV-2 by FDA under an Emergency Use Authorization (EUA). This EUA will remain in effect (meaning this test can be used) for the duration of the COVID-19 declaration under Section 564(b)(1) of the Act, 21 U.S.C. section 360bbb-3(b)(1), unless the authorization is terminated or revoked.     Resp Syncytial Virus by PCR NEGATIVE NEGATIVE Final    Comment: (NOTE) Fact Sheet for Patients: BloggerCourse.com  Fact Sheet for Healthcare Providers: SeriousBroker.it  This test is not yet approved or cleared by the [place]  FDA and has been authorized for detection and/or diagnosis of SARS-CoV-2 by FDA under an Emergency Use Authorization (EUA). This EUA will remain in effect (meaning this test can be used) for the duration of the COVID-19 declaration under Section 564(b)(1) of the Act, 21 U.S.C. section 360bbb-3(b)(1), unless the authorization is terminated or revoked.  Performed at [facility], [address], [place], [place] [address]   MRSA Next Gen by PCR, Nasal     Status: Abnormal   Collection Time: 06/21/23  4:27 PM   Specimen: Urine, Clean Catch; Nasal Swab  Result Value Ref Range Status   MRSA by PCR Next Gen DETECTED (A) NOT DETECTED Final    Comment:  (NOTE) The GeneXpert MRSA Assay (FDA approved for NASAL specimens only), is one component of a comprehensive MRSA colonization surveillance program. It is not intended to diagnose MRSA infection nor to guide or monitor treatment for MRSA infections. Test performance is not FDA approved in patients less than 62 years old. Performed at [facility], [address], [place], [place] [address]   Respiratory (~20 pathogens) panel by PCR     Status: Abnormal   Collection Time: 06/21/23  4:29 PM  Specimen: Nasopharyngeal Swab; Respiratory  Result Value Ref Range Status   Adenovirus NOT DETECTED NOT DETECTED Final   Coronavirus 229E NOT DETECTED NOT DETECTED Final    Comment: (NOTE) The Coronavirus on the Respiratory Panel, DOES NOT test for the novel  Coronavirus (2019 nCoV)    Coronavirus HKU1 NOT DETECTED NOT DETECTED Final   Coronavirus NL63 NOT DETECTED NOT DETECTED Final   Coronavirus OC43 NOT DETECTED NOT DETECTED Final   Metapneumovirus NOT DETECTED NOT DETECTED Final   Rhinovirus / Enterovirus DETECTED (A) NOT DETECTED Final   Influenza A NOT DETECTED NOT DETECTED Final   Influenza B NOT DETECTED NOT DETECTED Final   Parainfluenza Virus 1 NOT DETECTED NOT DETECTED Final   Parainfluenza Virus 2 NOT DETECTED NOT DETECTED Final   Parainfluenza Virus 3 NOT DETECTED NOT DETECTED Final   Parainfluenza Virus 4 NOT DETECTED NOT DETECTED Final   Respiratory Syncytial Virus NOT DETECTED NOT DETECTED Final   Bordetella pertussis NOT DETECTED NOT DETECTED Final   Bordetella Parapertussis NOT DETECTED NOT DETECTED Final   Chlamydophila pneumoniae NOT DETECTED NOT DETECTED Final   Mycoplasma pneumoniae NOT DETECTED NOT DETECTED Final    Comment: Performed at [facility], [address], [place], [place] [address]  Expectorated Sputum Assessment w Gram Stain, Rflx to Resp Cult     Status: None   Collection Time: 06/23/23  9:00 PM   Specimen: Sputum  Result Value  Ref Range Status   Specimen Description SPUTUM  Final   Special Requests NONE  Final   Sputum evaluation   Final    Sputum specimen not acceptable for testing.  Please recollect.   Performed at [facility], [address] [place], [place] [address]    Report Status 06/23/2023 FINAL  Final     Labs: BNP (last 3 results) Recent Labs    06/24/23 1654 06/27/23 0422  BNP 72.0 20.1   Basic Metabolic Panel: Recent Labs  Lab 06/21/23 0350 06/21/23 1634 06/22/23 0353 06/22/23 1726 06/23/23 0332 06/24/23 0417 06/25/23 0826 06/26/23 0352 06/27/23 0422  NA 140  --  137  --   --  140 138 139  --   K 3.8  --  3.4*  --   --  3.9 4.0 3.9  --   CL 107  --  102  --   --  107 103 106  --   CO2 25  --  26  --   --  23 28 24   --   GLUCOSE 135*  --  129*  --   --  120* 111* 129*  --   BUN 23  --  15  --   --  20 20 23   --   CREATININE 0.69  --  0.68  --   --  0.57* 0.57* 0.58* 0.57*  CALCIUM 8.2*  --  8.0*  --   --  8.4* 8.5* 8.3*  --   MG  --    < > 1.7 1.8 1.8 1.9 1.9 2.0  --   PHOS  --    < > 3.5 3.7 4.1 3.6 3.9 3.8  --    < > = values in this interval not displayed.   Liver Function Tests: Recent Labs  Lab 06/20/23 1503 06/22/23 0353 06/24/23 0417 06/25/23 0826 06/26/23 0352  AST 25 26 33  --  41  ALT 25 26 40  --  71*  ALKPHOS 67 56 68  --  66  BILITOT 0.8 0.7 0.6  --  0.3  PROT 7.5 5.5* 6.0*  --  6.4*  ALBUMIN  3.4* 2.3* 2.4* 2.5* 2.4*   No results for input(s): "LIPASE", "AMYLASE" in the last 168 hours. No results for input(s): "AMMONIA" in the last 168 hours. CBC: Recent Labs  Lab 06/20/23 1503 06/21/23 0350 06/22/23 0353 06/24/23 0417 06/25/23 0826 06/26/23 0352 06/27/23 0422  WBC 12.7*   < > 11.3* 11.8* 12.3* 13.3* 10.1  NEUTROABS 11.5*  --  9.5* 9.3* 9.7* 10.5*  --   HGB 14.2   < > 11.3* 12.3* 13.1 12.3* 12.6*  HCT 43.7   < > 34.3* 37.4* 40.7 38.1* 37.7*  MCV 96.3   < > 96.9 93.7 95.8 96.5 94.3  PLT 227   < > 192 257 297 310 344   <  > = values in this interval not displayed.   Cardiac Enzymes: No results for input(s): "CKTOTAL", "CKMB", "CKMBINDEX", "TROPONINI" in the last 168 hours. BNP: Invalid input(s): "POCBNP" CBG: Recent Labs  Lab 06/26/23 2030 06/26/23 2339 06/27/23 0453 06/27/23 0739 06/27/23 1115  GLUCAP 113* 120* 107* 114* 94   D-Dimer No results for input(s): "DDIMER" in the last 72 hours. Hgb A1c No results for input(s): "HGBA1C" in the last 72 hours. Lipid Profile No results for input(s): "CHOL", "HDL", "LDLCALC", "TRIG", "CHOLHDL", "LDLDIRECT" in the last 72 hours. Thyroid  function studies No results for input(s): "TSH", "T4TOTAL", "T3FREE", "THYROIDAB" in the last 72 hours.  Invalid input(s): "FREET3" Anemia work up No results for input(s): "VITAMINB12", "FOLATE", "FERRITIN", "TIBC", "IRON", "RETICCTPCT" in the last 72 hours. Urinalysis    Component Value Date/Time   COLORURINE YELLOW 06/20/2023 2351   APPEARANCEUR CLEAR 06/20/2023 2351   LABSPEC 1.016 06/20/2023 2351   PHURINE 6.0 06/20/2023 2351   GLUCOSEU 50 (A) 06/20/2023 2351   HGBUR NEGATIVE 06/20/2023 2351   BILIRUBINUR NEGATIVE 06/20/2023 2351   KETONESUR NEGATIVE 06/20/2023 2351   PROTEINUR NEGATIVE 06/20/2023 2351   NITRITE NEGATIVE 06/20/2023 2351   LEUKOCYTESUR NEGATIVE 06/20/2023 2351   Sepsis Labs Recent Labs  Lab 06/24/23 0417 06/25/23 0826 06/26/23 0352 06/27/23 0422  WBC 11.8* 12.3* 13.3* 10.1   Microbiology Recent Results (from the past 240 hours)  Blood Culture (routine x 2)     Status: None   Collection Time: 06/20/23  3:20 PM   Specimen: BLOOD LEFT FOREARM  Result Value Ref Range Status   Specimen Description   Final    BLOOD LEFT FOREARM Performed at [facility], [address], [place], [place] [address]    Special Requests   Final    BOTTLES DRAWN AEROBIC AND ANAEROBIC Blood Culture adequate volume Performed at [facility], [address] [place], [place]  [address]    Culture   Final    NO GROWTH 5 DAYS Performed at [facility], [address], [place], [place] [address]    Report Status 06/25/2023 FINAL  Final  Blood Culture (routine x 2)     Status: None   Collection Time: 06/20/23  3:30 PM   Specimen: BLOOD RIGHT FOREARM  Result Value Ref Range Status   Specimen Description   Final    BLOOD RIGHT FOREARM Performed at [facility], [address] [place], [place] [address]    Special Requests   Final    BOTTLES DRAWN AEROBIC AND ANAEROBIC Blood Culture adequate volume Performed at [facility], [address], [place], [place] [address]  Culture   Final    NO GROWTH 5 DAYS Performed at [facility], [address], [place], [place] [address]    Report Status 06/25/2023 FINAL  Final  Resp panel by RT-PCR (RSV, Flu A&B, Covid) Anterior Nasal Swab     Status: None   Collection Time: 06/20/23  3:35 PM   Specimen: Anterior Nasal Swab  Result Value Ref Range Status   SARS Coronavirus 2 by RT PCR NEGATIVE NEGATIVE Final    Comment: (NOTE) SARS-CoV-2 target nucleic acids are NOT DETECTED.  The SARS-CoV-2 RNA is generally detectable in upper respiratory specimens during the acute phase of infection. The lowest concentration of SARS-CoV-2 viral copies this assay can detect is 138 copies/mL. A negative result does not preclude SARS-Cov-2 infection and should not be used as the sole basis for treatment or other patient management decisions. A negative result may occur with  improper specimen collection/handling, submission of specimen other than nasopharyngeal swab, presence of viral mutation(s) within the areas targeted by this assay, and inadequate number of viral copies(<138 copies/mL). A negative result must be combined with clinical observations, patient history, and epidemiological information. The expected result is Negative.  Fact Sheet for Patients:   BloggerCourse.com  Fact Sheet for Healthcare Providers:  SeriousBroker.it  This test is no t yet approved or cleared by the [place]  FDA and  has been authorized for detection and/or diagnosis of SARS-CoV-2 by FDA under an Emergency Use Authorization (EUA). This EUA will remain  in effect (meaning this test can be used) for the duration of the COVID-19 declaration under Section 564(b)(1) of the Act, 21 U.S.C.section 360bbb-3(b)(1), unless the authorization is terminated  or revoked sooner.       Influenza A by PCR NEGATIVE NEGATIVE Final   Influenza B by PCR NEGATIVE NEGATIVE Final    Comment: (NOTE) The Xpert Xpress SARS-CoV-2/FLU/RSV plus assay is intended as an aid in the diagnosis of influenza from Nasopharyngeal swab specimens and should not be used as a sole basis for treatment. Nasal washings and aspirates are unacceptable for Xpert Xpress SARS-CoV-2/FLU/RSV testing.  Fact Sheet for Patients: BloggerCourse.com  Fact Sheet for Healthcare Providers: SeriousBroker.it  This test is not yet approved or cleared by the [place]  FDA and has been authorized for detection and/or diagnosis of SARS-CoV-2 by FDA under an Emergency Use Authorization (EUA). This EUA will remain in effect (meaning this test can be used) for the duration of the COVID-19 declaration under Section 564(b)(1) of the Act, 21 U.S.C. section 360bbb-3(b)(1), unless the authorization is terminated or revoked.     Resp Syncytial Virus by PCR NEGATIVE NEGATIVE Final    Comment: (NOTE) Fact Sheet for Patients: BloggerCourse.com  Fact Sheet for Healthcare Providers: SeriousBroker.it  This test is not yet approved or cleared by the [place]  FDA and has been authorized for detection and/or diagnosis of SARS-CoV-2 by FDA under an Emergency Use  Authorization (EUA). This EUA will remain in effect (meaning this test can be used) for the duration of the COVID-19 declaration under Section 564(b)(1) of the Act, 21 U.S.C. section 360bbb-3(b)(1), unless the authorization is terminated or revoked.  Performed at [facility], [address], [place], [place] [address]   MRSA Next Gen by PCR, Nasal     Status: Abnormal   Collection Time: 06/21/23  4:27 PM   Specimen: Urine, Clean Catch; Nasal Swab  Result Value Ref Range Status   MRSA by PCR Next Gen DETECTED (A) NOT DETECTED  Final    Comment: (NOTE) The GeneXpert MRSA Assay (FDA approved for NASAL specimens only), is one component of a comprehensive MRSA colonization surveillance program. It is not intended to diagnose MRSA infection nor to guide or monitor treatment for MRSA infections. Test performance is not FDA approved in patients less than 24 years old. Performed at [facility], [address], [place], [place] [address]   Respiratory (~20 pathogens) panel by PCR     Status: Abnormal   Collection Time: 06/21/23  4:29 PM   Specimen: Nasopharyngeal Swab; Respiratory  Result Value Ref Range Status   Adenovirus NOT DETECTED NOT DETECTED Final   Coronavirus 229E NOT DETECTED NOT DETECTED Final    Comment: (NOTE) The Coronavirus on the Respiratory Panel, DOES NOT test for the novel  Coronavirus (2019 nCoV)    Coronavirus HKU1 NOT DETECTED NOT DETECTED Final   Coronavirus NL63 NOT DETECTED NOT DETECTED Final   Coronavirus OC43 NOT DETECTED NOT DETECTED Final   Metapneumovirus NOT DETECTED NOT DETECTED Final   Rhinovirus / Enterovirus DETECTED (A) NOT DETECTED Final   Influenza A NOT DETECTED NOT DETECTED Final   Influenza B NOT DETECTED NOT DETECTED Final   Parainfluenza Virus 1 NOT DETECTED NOT DETECTED Final   Parainfluenza Virus 2 NOT DETECTED NOT DETECTED Final   Parainfluenza Virus 3 NOT DETECTED NOT DETECTED Final   Parainfluenza  Virus 4 NOT DETECTED NOT DETECTED Final   Respiratory Syncytial Virus NOT DETECTED NOT DETECTED Final   Bordetella pertussis NOT DETECTED NOT DETECTED Final   Bordetella Parapertussis NOT DETECTED NOT DETECTED Final   Chlamydophila pneumoniae NOT DETECTED NOT DETECTED Final   Mycoplasma pneumoniae NOT DETECTED NOT DETECTED Final    Comment: Performed at [facility], [address] [place], [place] [address]  Expectorated Sputum Assessment w Gram Stain, Rflx to Resp Cult     Status: None   Collection Time: 06/23/23  9:00 PM   Specimen: Sputum  Result Value Ref Range Status   Specimen Description SPUTUM  Final   Special Requests NONE  Final   Sputum evaluation   Final    Sputum specimen not acceptable for testing.  Please recollect.   Performed at [facility], [address], [place], [place] [address]    Report Status 06/23/2023 FINAL  Final     Time coordinating discharge: Over 30 minutes  SIGNED:   Rema Care [place], DO  [facility] 06/27/2023, 12:22 PM

## 2023-06-27 NOTE — Progress Notes (Signed)
 NAME:  Guy Payne, MRN:  [id number], DOB:  03-26-1950, LOS: 7 ADMISSION DATE:  06/20/2023, CONSULTATION DATE:  06/27/23 REFERRING MD:  Lanette Pipe, CHIEF COMPLAINT:  pneumonia   History of Present Illness:  Guy Payne is a 73 y.o. M with PMH significant for ALS and tongue palsy with PEG who presented to the ER with several days of productive cough and fever and was found to have basilar pneumonia with viral panel positive for rhinovirus.  He is followed at the ALS clinic through [facility] and spouse was inquiring whether pt meets criteria for home NIV.   Latest Reference Range & Units 06/20/23 15:03  pH, Ven 7.25 - 7.43  7.5 (H)  pCO2, Ven 44 - 60 mmHg 36 (L)  pO2, Ven 32 - 45 mmHg 59 (H)  (H): Data is abnormally high (L): Data is abnormally low  Pertinent  Medical History   has a past medical history of ALS (amyotrophic lateral sclerosis) (HCC), Fall (2021), MCI (mild cognitive impairment), Memory loss, Prostate disorder, and Restless leg.   Significant Hospital Events: Including procedures, antibiotic start and stop dates in addition to other pertinent events   4/30 admit with viral pneumonia  - rHINOVIRUS an dMRSA PCR + CT dramatic chagne sine 05/31/23 when it was clear PRocal 4.13 5.3 - procal improved to 0.97 5/4 - Feels about the same since admission. Still coughing, not able to expectorate his secretions. Oxygen decreased to 3L 5/5- wife at bedside. STates and documetnation he was able to walk on 2L [place] but now sitting on 4L [place] and pulse ox 97%. Taptered down to 2L [place]. Also documented as 92% RA rest . Afbrile since 4/30/25PM  Interim History / Subjective:   5/6 - Now on room air. Looking forward to going home. RN an dwife at bedside   Objective   Blood pressure (!) 106/56, pulse 68, temperature 98.2 F (36.8 C), temperature source Oral, resp. rate 16, height 5\' 8"  (1.727 m), weight 56.9 kg, SpO2 95%.        Intake/Output Summary (Last 24 hours) at 06/27/2023 0924 Last data  filed at 06/27/2023 0700 Gross per 24 hour  Intake 0 ml  Output 475 ml  Net -475 ml   Filed Weights   06/25/23 0500 06/26/23 0500 06/27/23 0500  Weight: 55.7 kg 56 kg 56.9 kg    General: No distress. Looks btter Neuro: Alert and Oriented x 3. GCS 15. Speech and tone weak c/w ALS Psych: Pleasant Resp:  Barrel Chest - no.  Wheeze - no, Crackles - no, No overt respiratory distress CVS: Normal heart sounds. Murmurs - no Ext: Stigmata of Connective Tissue Disease - no  HEENT: Normal upper airway. PEERL +. No post nasal drip   LABS    PULMONARY Recent Labs  Lab 06/20/23 1503  HCO3 28.1*  O2SAT 93.8    CBC Recent Labs  Lab 06/25/23 0826 06/26/23 0352 06/27/23 0422  HGB 13.1 12.3* 12.6*  HCT 40.7 38.1* 37.7*  WBC 12.3* 13.3* 10.1  PLT 297 310 344    COAGULATION Recent Labs  Lab 06/20/23 1503 06/21/23 0350  INR 1.2 1.3*    CARDIAC  No results for input(s): "TROPONINI" in the last 168 hours. No results for input(s): "PROBNP" in the last 168 hours.   CHEMISTRY Recent Labs  Lab 06/21/23 0350 06/21/23 1634 06/22/23 0353 06/22/23 1726 06/23/23 0332 06/24/23 0417 06/25/23 0826 06/26/23 0352 06/27/23 0422  NA 140  --  137  --   --  140 138 139  --   K 3.8  --  3.4*  --   --  3.9 4.0 3.9  --   CL 107  --  102  --   --  107 103 106  --   CO2 25  --  26  --   --  23 28 24   --   GLUCOSE 135*  --  129*  --   --  120* 111* 129*  --   BUN 23  --  15  --   --  20 20 23   --   CREATININE 0.69  --  0.68  --   --  0.57* 0.57* 0.58* 0.57*  CALCIUM 8.2*  --  8.0*  --   --  8.4* 8.5* 8.3*  --   MG  --    < > 1.7 1.8 1.8 1.9 1.9 2.0  --   PHOS  --    < > 3.5 3.7 4.1 3.6 3.9 3.8  --    < > = values in this interval not displayed.   Estimated Creatinine Clearance: 65.2 mL/min (A) (by C-G formula based on SCr of 0.57 mg/dL (L)).   LIVER Recent Labs  Lab 06/20/23 1503 06/21/23 0350 06/22/23 0353 06/24/23 0417 06/25/23 0826 06/26/23 0352  AST 25  --  26 33  --   41  ALT 25  --  26 40  --  71*  ALKPHOS 67  --  56 68  --  66  BILITOT 0.8  --  0.7 0.6  --  0.3  PROT 7.5  --  5.5* 6.0*  --  6.4*  ALBUMIN  3.4*  --  2.3* 2.4* 2.5* 2.4*  INR 1.2 1.3*  --   --   --   --      INFECTIOUS Recent Labs  Lab 06/20/23 1602 06/20/23 1713 06/21/23 1634 06/21/23 1634 06/22/23 1726 06/24/23 0417 06/27/23 0422  LATICACIDVEN 2.9* 2.0* 1.6  --   --   --   --   PROCALCITON  --   --  4.13   < > 2.82 0.97 0.20   < > = values in this interval not displayed.     ENDOCRINE CBG (last 3)  Recent Labs    06/26/23 2339 06/27/23 0453 06/27/23 0739  GLUCAP 120* 107* 114*         IMAGING x48h  - image(s) personally visualized  -   highlighted in bold No results found.   Resolved Hospital Problem list     Assessment & Plan:   Sepsis and acute hypoxic respiratory failure due to viral pneumonia in the setting of ALS Small left parapneumonic effusion on left -last cx 06/24/23  55/6/25 - resolved hypoxedmia. PCT Down to 0.2. ESR 565. BNP normal   Plan  - - Aim to dc home 06/27/23 with or without o2 -effusion likely too small to tap, but can consider if it enlarges - followup as opd - cxr as opd I-> if infitlates + and eSR still high -> can do steroids -> tBD at followup -con't coughing for good pulmonary hygiene - -Pt did not meet criteria for NIV at home per last [facility] note, given his acute infection, it is difficult to assess if pt meets criteria for long term NIV while hospitalized. No hypercapnia on admission blood gas, suggesting he does not need home NIV at present. Will need ongoing close follow up with ALS clinic as an outpatient.    Ccm will sign  off   Best practice:  Per [facility]  FOLLOWUP Future Appointments  Date Time Provider Department Center  07/21/2023  8:45 AM Maire Scot, MD [facility] None     SIGNATURE    Dr. Maire Scot, M.D., F.C.C.P,  Pulmonary and Critical Care Medicine Staff Physician, [facility] Director - Interstitial Lung Disease  Program  Pulmonary Fibrosis [facility] at [facility] [place], [place], [address]   Pager: [phone], If no answer  -> Check AMION or Try [phone] Telephone (clinical office): [phone] Telephone (research): [phone]  9:24 AM 06/27/2023

## 2023-06-27 NOTE — Care Management Important Message (Signed)
 Important Message  Patient Details IM Letter given to the Patient Name: Guy Payne MRN: [id number] Date of Birth: Jun 10, 1950   Important Message Given:  Yes - Medicare IM     Curtiss Dowdy 06/27/2023, 12:57 PM

## 2023-06-27 NOTE — Evaluation (Signed)
 Occupational Therapy Evaluation Patient Details Name: Guy Payne MRN: [id number] DOB: June 09, 1950 Today's Date: 06/27/2023   History of Present Illness   73 yr old male admitted with PNA. PMH: ALS, tongue palsy-inability to speak, memory loss, RLS, falls     Clinical Impressions The pt is currently presenting with the below listed deficits (see OT problem list). During the session, he required CGA to min assist for most tasks, including simulated lower body dressing, sit to stand using a RW, toileting at bathroom level, and ambulating into the hall using a RW. He required occasional cues for general safety/insight during progressive activity. He will continue to benefit from further OT services to maximize his independence and safety with ADLs & to facilitate his safe return home. Home health OT is recommended.      If plan is discharge home, recommend the following:   Assistance with cooking/housework;Assist for transportation;Help with stairs or ramp for entrance;A little help with bathing/dressing/bathroom     Functional Status Assessment   Patient has had a recent decline in their functional status and demonstrates the ability to make significant improvements in function in a reasonable and predictable amount of time.     Equipment Recommendations   None recommended by OT     Recommendations for Other Services         Precautions/Restrictions   Precautions Precaution/Restrictions Comments: monitor O2; PEG Restrictions Weight Bearing Restrictions Per Provider Order: No     Mobility Bed Mobility Overal bed mobility: Needs Assistance Bed Mobility: Supine to Sit     Supine to sit: Supervision, HOB elevated          Transfers Overall transfer level: Needs assistance Equipment used: Rolling walker (2 wheels) Transfers: Sit to/from Stand Sit to Stand: Contact guard assist, From elevated surface                  Balance     Sitting  balance-Leahy Scale: Good       Standing balance-Leahy Scale: Fair              ADL either performed or assessed with clinical judgement   ADL Overall ADL's : Needs assistance/impaired Eating/Feeding: Total assistance Eating/Feeding Details (indicate cue type and reason): feeding tube Grooming: Minimal assistance;Sitting           Upper Body Dressing : Sitting;Minimal assistance   Lower Body Dressing: Moderate assistance;Sitting/lateral leans       Toileting- Clothing Manipulation and Hygiene: Minimal assistance;Sit to/from stand Toileting - Clothing Manipulation Details (indicate cue type and reason): based on clinical judgement                       Pertinent Vitals/Pain Pain Assessment Pain Assessment: No/denies pain     Extremity/Trunk Assessment Upper Extremity Assessment Upper Extremity Assessment: Right hand dominant;Left hand dominant;RUE deficits/detail;LUE deficits/detail RUE Deficits / Details: decreased grip strength at ~3+/5, which is typical at his baseline LUE Deficits / Details: decreased grip strength at ~3+/5, which is typical at his baseline   Lower Extremity Assessment Lower Extremity Assessment: Overall WFL for tasks assessed       Communication Communication Communication: No apparent difficulties Factors Affecting Communication: Difficulty expressing self (inability to vocalize/produce speech)   Cognition Arousal: Alert Behavior During Therapy: [facility] for tasks assessed/performed            Following commands: Intact                  Home  Living Family/patient expects to be discharged to:: Private residence Living Arrangements: Spouse/significant other Available Help at Discharge: Family Type of Home: House Home Access: Stairs to enter [employer] of Steps: 1   Home Layout: Two level;Full bath on main level Alternate Level Stairs-Number of Steps: his bedroom and full bath are on main level of home    Bathroom Shower/Tub: Walk-in shower         Home Equipment: Pharmacist, hospital (2 wheels);Wheelchair - Magazine features editor (4 wheels)          Prior Functioning/Environment Prior Level of Function : Needs assist             Mobility Comments: He used a rollator vs. RW for ambulation. ADLs Comments: His spouse assisted with bathing and dressing. He was receiving home therapy.     OT Problem List: Decreased strength;Impaired balance (sitting and/or standing);Decreased coordination;Decreased safety awareness   OT Treatment/Interventions: Self-care/ADL training;Therapeutic activities;Therapeutic exercise;Energy conservation;Patient/family education;DME and/or AE instruction;Balance training      OT Goals(Current goals can be found in the care plan section)   Acute Rehab OT Goals OT Goal Formulation: With patient/family Time For Goal Achievement: 07/11/23 Potential to Achieve Goals: Good ADL Goals Pt Will Perform Upper Body Dressing: with set-up;sitting Pt Will Perform Lower Body Dressing: sit to/from stand;with adaptive equipment;with supervision;sitting/lateral leans Pt Will Transfer to Toilet: with supervision;ambulating Pt Will Perform Toileting - Clothing Manipulation and hygiene: with supervision;sit to/from stand   OT Frequency:  Min 2X/week       AM-PAC OT "6 Clicks" Daily Activity     Outcome Measure Help from another person eating meals?: Total Help from another person taking care of personal grooming?: A Little Help from another person toileting, which includes using toliet, bedpan, or urinal?: A Little Help from another person bathing (including washing, rinsing, drying)?: A Lot Help from another person to put on and taking off regular upper body clothing?: A Little Help from another person to put on and taking off regular lower body clothing?: A Little 6 Click Score: 15   End of Session Equipment Utilized During Treatment: Rolling  walker (2 wheels) Nurse Communication: Mobility status  Activity Tolerance: Patient tolerated treatment well Patient left: in chair;with call bell/phone within reach;with family/visitor present  OT Visit Diagnosis: Unsteadiness on feet (R26.81);Muscle weakness (generalized) (M62.81)                Time: 6578-4696 OT Time Calculation (min): 42 min Charges:  OT General Charges $OT Visit: 1 Visit OT Evaluation $OT Eval Moderate Complexity: 1 Mod OT Treatments $Therapeutic Activity: 8-22 mins    Sheralyn Dies, OTR/L 06/27/2023, 2:47 PM

## 2023-06-27 NOTE — TOC Transition Note (Signed)
 Transition of Care [facility]) - Discharge Note  Patient Details  Name: Guy Payne MRN: [id number] Date of Birth: [birth year]  [facility] CM/SW Contact:  Zenon Hilda, LCSW Phone Number: 06/27/2023, 1:39 PM  Clinical Narrative: HH orders placed by hospitalist for PT/OT/RN/speech. CSW notified Glenda with [facility] regarding orders. CSW updated patient and spouse regarding HH. Wife confirmed she will transport the patient home. TOC signing off.  Final next level of care: Home w Home Health Services Barriers to Discharge: Barriers Resolved  Patient Goals and CMS Choice Patient states their goals for this hospitalization and ongoing recovery are:: Resume HH through [facility] CMS Medicare.gov Compare Post Acute Care list provided to:: Patient Represenative (must comment) Choice offered to / list presented to : Spouse  Discharge Plan and Services Additional resources added to the After Visit Summary for   In-house Referral: Clinical Social Work Discharge Planning Services: NA Post Acute Care Choice: Home Health          DME Arranged: N/A DME Agency: NA HH Arranged: RN, PT, OT, Speech Therapy HH Agency: [facility] Date HH Agency Contacted: 06/27/23 Time HH Agency Contacted: 1234 Representative spoke with at [facility] Agency: [facility]  Social Drivers of Health (SDOH) Interventions SDOH Screenings   Food Insecurity: No Food Insecurity (06/20/2023)  Housing: Low Risk  (06/20/2023)  Transportation Needs: No Transportation Needs (06/20/2023)  Utilities: Not At Risk (06/20/2023)  Social Connections: Unknown (06/20/2023)  Tobacco Use: Low Risk  (06/20/2023)   Readmission Risk Interventions     No data to display

## 2023-06-30 DIAGNOSIS — G1221 Amyotrophic lateral sclerosis: Secondary | ICD-10-CM | POA: Diagnosis not present

## 2023-06-30 DIAGNOSIS — Z431 Encounter for attention to gastrostomy: Secondary | ICD-10-CM | POA: Diagnosis not present

## 2023-06-30 DIAGNOSIS — I10 Essential (primary) hypertension: Secondary | ICD-10-CM | POA: Diagnosis not present

## 2023-06-30 DIAGNOSIS — R4701 Aphasia: Secondary | ICD-10-CM | POA: Diagnosis not present

## 2023-06-30 DIAGNOSIS — F02818 Dementia in other diseases classified elsewhere, unspecified severity, with other behavioral disturbance: Secondary | ICD-10-CM | POA: Diagnosis not present

## 2023-06-30 DIAGNOSIS — Z931 Gastrostomy status: Secondary | ICD-10-CM | POA: Diagnosis not present

## 2023-06-30 DIAGNOSIS — E46 Unspecified protein-calorie malnutrition: Secondary | ICD-10-CM | POA: Diagnosis not present

## 2023-06-30 DIAGNOSIS — R633 Feeding difficulties, unspecified: Secondary | ICD-10-CM | POA: Diagnosis not present

## 2023-06-30 DIAGNOSIS — G4761 Periodic limb movement disorder: Secondary | ICD-10-CM | POA: Diagnosis not present

## 2023-06-30 DIAGNOSIS — R1312 Dysphagia, oropharyngeal phase: Secondary | ICD-10-CM | POA: Diagnosis not present

## 2023-06-30 DIAGNOSIS — G3109 Other frontotemporal dementia: Secondary | ICD-10-CM | POA: Diagnosis not present

## 2023-07-03 DIAGNOSIS — R4701 Aphasia: Secondary | ICD-10-CM | POA: Diagnosis not present

## 2023-07-03 DIAGNOSIS — E46 Unspecified protein-calorie malnutrition: Secondary | ICD-10-CM | POA: Diagnosis not present

## 2023-07-03 DIAGNOSIS — G3109 Other frontotemporal dementia: Secondary | ICD-10-CM | POA: Diagnosis not present

## 2023-07-03 DIAGNOSIS — G4761 Periodic limb movement disorder: Secondary | ICD-10-CM | POA: Diagnosis not present

## 2023-07-03 DIAGNOSIS — F02818 Dementia in other diseases classified elsewhere, unspecified severity, with other behavioral disturbance: Secondary | ICD-10-CM | POA: Diagnosis not present

## 2023-07-03 DIAGNOSIS — G1221 Amyotrophic lateral sclerosis: Secondary | ICD-10-CM | POA: Diagnosis not present

## 2023-07-03 DIAGNOSIS — R1312 Dysphagia, oropharyngeal phase: Secondary | ICD-10-CM | POA: Diagnosis not present

## 2023-07-03 DIAGNOSIS — Z431 Encounter for attention to gastrostomy: Secondary | ICD-10-CM | POA: Diagnosis not present

## 2023-07-03 DIAGNOSIS — I10 Essential (primary) hypertension: Secondary | ICD-10-CM | POA: Diagnosis not present

## 2023-07-05 DIAGNOSIS — Z22322 Carrier or suspected carrier of Methicillin resistant Staphylococcus aureus: Secondary | ICD-10-CM | POA: Diagnosis not present

## 2023-07-05 DIAGNOSIS — Z09 Encounter for follow-up examination after completed treatment for conditions other than malignant neoplasm: Secondary | ICD-10-CM | POA: Diagnosis not present

## 2023-07-05 DIAGNOSIS — N289 Disorder of kidney and ureter, unspecified: Secondary | ICD-10-CM | POA: Diagnosis not present

## 2023-07-05 DIAGNOSIS — J189 Pneumonia, unspecified organism: Secondary | ICD-10-CM | POA: Diagnosis not present

## 2023-07-05 DIAGNOSIS — A403 Sepsis due to Streptococcus pneumoniae: Secondary | ICD-10-CM | POA: Diagnosis not present

## 2023-07-05 DIAGNOSIS — E43 Unspecified severe protein-calorie malnutrition: Secondary | ICD-10-CM | POA: Diagnosis not present

## 2023-07-06 DIAGNOSIS — G4761 Periodic limb movement disorder: Secondary | ICD-10-CM | POA: Diagnosis not present

## 2023-07-06 DIAGNOSIS — I10 Essential (primary) hypertension: Secondary | ICD-10-CM | POA: Diagnosis not present

## 2023-07-06 DIAGNOSIS — F02818 Dementia in other diseases classified elsewhere, unspecified severity, with other behavioral disturbance: Secondary | ICD-10-CM | POA: Diagnosis not present

## 2023-07-06 DIAGNOSIS — G3109 Other frontotemporal dementia: Secondary | ICD-10-CM | POA: Diagnosis not present

## 2023-07-06 DIAGNOSIS — E46 Unspecified protein-calorie malnutrition: Secondary | ICD-10-CM | POA: Diagnosis not present

## 2023-07-06 DIAGNOSIS — G1221 Amyotrophic lateral sclerosis: Secondary | ICD-10-CM | POA: Diagnosis not present

## 2023-07-06 DIAGNOSIS — R1312 Dysphagia, oropharyngeal phase: Secondary | ICD-10-CM | POA: Diagnosis not present

## 2023-07-06 DIAGNOSIS — Z431 Encounter for attention to gastrostomy: Secondary | ICD-10-CM | POA: Diagnosis not present

## 2023-07-06 DIAGNOSIS — R4701 Aphasia: Secondary | ICD-10-CM | POA: Diagnosis not present

## 2023-07-10 DIAGNOSIS — R1312 Dysphagia, oropharyngeal phase: Secondary | ICD-10-CM | POA: Diagnosis not present

## 2023-07-10 DIAGNOSIS — R4701 Aphasia: Secondary | ICD-10-CM | POA: Diagnosis not present

## 2023-07-10 DIAGNOSIS — F02818 Dementia in other diseases classified elsewhere, unspecified severity, with other behavioral disturbance: Secondary | ICD-10-CM | POA: Diagnosis not present

## 2023-07-10 DIAGNOSIS — I10 Essential (primary) hypertension: Secondary | ICD-10-CM | POA: Diagnosis not present

## 2023-07-10 DIAGNOSIS — G4761 Periodic limb movement disorder: Secondary | ICD-10-CM | POA: Diagnosis not present

## 2023-07-10 DIAGNOSIS — G1221 Amyotrophic lateral sclerosis: Secondary | ICD-10-CM | POA: Diagnosis not present

## 2023-07-10 DIAGNOSIS — Z431 Encounter for attention to gastrostomy: Secondary | ICD-10-CM | POA: Diagnosis not present

## 2023-07-10 DIAGNOSIS — G3109 Other frontotemporal dementia: Secondary | ICD-10-CM | POA: Diagnosis not present

## 2023-07-10 DIAGNOSIS — E46 Unspecified protein-calorie malnutrition: Secondary | ICD-10-CM | POA: Diagnosis not present

## 2023-07-11 DIAGNOSIS — G1221 Amyotrophic lateral sclerosis: Secondary | ICD-10-CM | POA: Diagnosis not present

## 2023-07-11 DIAGNOSIS — R1312 Dysphagia, oropharyngeal phase: Secondary | ICD-10-CM | POA: Diagnosis not present

## 2023-07-12 DIAGNOSIS — G4761 Periodic limb movement disorder: Secondary | ICD-10-CM | POA: Diagnosis not present

## 2023-07-12 DIAGNOSIS — R4701 Aphasia: Secondary | ICD-10-CM | POA: Diagnosis not present

## 2023-07-12 DIAGNOSIS — G3109 Other frontotemporal dementia: Secondary | ICD-10-CM | POA: Diagnosis not present

## 2023-07-12 DIAGNOSIS — I10 Essential (primary) hypertension: Secondary | ICD-10-CM | POA: Diagnosis not present

## 2023-07-12 DIAGNOSIS — E46 Unspecified protein-calorie malnutrition: Secondary | ICD-10-CM | POA: Diagnosis not present

## 2023-07-12 DIAGNOSIS — G1221 Amyotrophic lateral sclerosis: Secondary | ICD-10-CM | POA: Diagnosis not present

## 2023-07-12 DIAGNOSIS — Z431 Encounter for attention to gastrostomy: Secondary | ICD-10-CM | POA: Diagnosis not present

## 2023-07-12 DIAGNOSIS — R1312 Dysphagia, oropharyngeal phase: Secondary | ICD-10-CM | POA: Diagnosis not present

## 2023-07-12 DIAGNOSIS — F02818 Dementia in other diseases classified elsewhere, unspecified severity, with other behavioral disturbance: Secondary | ICD-10-CM | POA: Diagnosis not present

## 2023-07-13 DIAGNOSIS — G1221 Amyotrophic lateral sclerosis: Secondary | ICD-10-CM | POA: Diagnosis not present

## 2023-07-13 DIAGNOSIS — R4701 Aphasia: Secondary | ICD-10-CM | POA: Diagnosis not present

## 2023-07-13 DIAGNOSIS — E46 Unspecified protein-calorie malnutrition: Secondary | ICD-10-CM | POA: Diagnosis not present

## 2023-07-13 DIAGNOSIS — G4761 Periodic limb movement disorder: Secondary | ICD-10-CM | POA: Diagnosis not present

## 2023-07-13 DIAGNOSIS — R1312 Dysphagia, oropharyngeal phase: Secondary | ICD-10-CM | POA: Diagnosis not present

## 2023-07-13 DIAGNOSIS — F02818 Dementia in other diseases classified elsewhere, unspecified severity, with other behavioral disturbance: Secondary | ICD-10-CM | POA: Diagnosis not present

## 2023-07-13 DIAGNOSIS — I10 Essential (primary) hypertension: Secondary | ICD-10-CM | POA: Diagnosis not present

## 2023-07-13 DIAGNOSIS — G3109 Other frontotemporal dementia: Secondary | ICD-10-CM | POA: Diagnosis not present

## 2023-07-13 DIAGNOSIS — Z431 Encounter for attention to gastrostomy: Secondary | ICD-10-CM | POA: Diagnosis not present

## 2023-07-14 DIAGNOSIS — R1312 Dysphagia, oropharyngeal phase: Secondary | ICD-10-CM | POA: Diagnosis not present

## 2023-07-14 DIAGNOSIS — I10 Essential (primary) hypertension: Secondary | ICD-10-CM | POA: Diagnosis not present

## 2023-07-14 DIAGNOSIS — E46 Unspecified protein-calorie malnutrition: Secondary | ICD-10-CM | POA: Diagnosis not present

## 2023-07-14 DIAGNOSIS — F02818 Dementia in other diseases classified elsewhere, unspecified severity, with other behavioral disturbance: Secondary | ICD-10-CM | POA: Diagnosis not present

## 2023-07-14 DIAGNOSIS — G1221 Amyotrophic lateral sclerosis: Secondary | ICD-10-CM | POA: Diagnosis not present

## 2023-07-14 DIAGNOSIS — G3109 Other frontotemporal dementia: Secondary | ICD-10-CM | POA: Diagnosis not present

## 2023-07-14 DIAGNOSIS — G4761 Periodic limb movement disorder: Secondary | ICD-10-CM | POA: Diagnosis not present

## 2023-07-14 DIAGNOSIS — R4701 Aphasia: Secondary | ICD-10-CM | POA: Diagnosis not present

## 2023-07-14 DIAGNOSIS — Z431 Encounter for attention to gastrostomy: Secondary | ICD-10-CM | POA: Diagnosis not present

## 2023-07-16 DIAGNOSIS — G4761 Periodic limb movement disorder: Secondary | ICD-10-CM | POA: Diagnosis not present

## 2023-07-16 DIAGNOSIS — G1221 Amyotrophic lateral sclerosis: Secondary | ICD-10-CM | POA: Diagnosis not present

## 2023-07-16 DIAGNOSIS — F02818 Dementia in other diseases classified elsewhere, unspecified severity, with other behavioral disturbance: Secondary | ICD-10-CM | POA: Diagnosis not present

## 2023-07-16 DIAGNOSIS — G3109 Other frontotemporal dementia: Secondary | ICD-10-CM | POA: Diagnosis not present

## 2023-07-16 DIAGNOSIS — J9601 Acute respiratory failure with hypoxia: Secondary | ICD-10-CM | POA: Diagnosis not present

## 2023-07-16 DIAGNOSIS — J988 Other specified respiratory disorders: Secondary | ICD-10-CM | POA: Diagnosis not present

## 2023-07-16 DIAGNOSIS — A403 Sepsis due to Streptococcus pneumoniae: Secondary | ICD-10-CM | POA: Diagnosis not present

## 2023-07-16 DIAGNOSIS — B9710 Unspecified enterovirus as the cause of diseases classified elsewhere: Secondary | ICD-10-CM | POA: Diagnosis not present

## 2023-07-16 DIAGNOSIS — J15212 Pneumonia due to Methicillin resistant Staphylococcus aureus: Secondary | ICD-10-CM | POA: Diagnosis not present

## 2023-07-19 DIAGNOSIS — B9710 Unspecified enterovirus as the cause of diseases classified elsewhere: Secondary | ICD-10-CM | POA: Diagnosis not present

## 2023-07-19 DIAGNOSIS — J988 Other specified respiratory disorders: Secondary | ICD-10-CM | POA: Diagnosis not present

## 2023-07-19 DIAGNOSIS — G4761 Periodic limb movement disorder: Secondary | ICD-10-CM | POA: Diagnosis not present

## 2023-07-19 DIAGNOSIS — G3109 Other frontotemporal dementia: Secondary | ICD-10-CM | POA: Diagnosis not present

## 2023-07-19 DIAGNOSIS — F02818 Dementia in other diseases classified elsewhere, unspecified severity, with other behavioral disturbance: Secondary | ICD-10-CM | POA: Diagnosis not present

## 2023-07-19 DIAGNOSIS — G1221 Amyotrophic lateral sclerosis: Secondary | ICD-10-CM | POA: Diagnosis not present

## 2023-07-19 DIAGNOSIS — A403 Sepsis due to Streptococcus pneumoniae: Secondary | ICD-10-CM | POA: Diagnosis not present

## 2023-07-19 DIAGNOSIS — J9601 Acute respiratory failure with hypoxia: Secondary | ICD-10-CM | POA: Diagnosis not present

## 2023-07-19 DIAGNOSIS — J15212 Pneumonia due to Methicillin resistant Staphylococcus aureus: Secondary | ICD-10-CM | POA: Diagnosis not present

## 2023-07-20 DIAGNOSIS — G4761 Periodic limb movement disorder: Secondary | ICD-10-CM | POA: Diagnosis not present

## 2023-07-20 DIAGNOSIS — J988 Other specified respiratory disorders: Secondary | ICD-10-CM | POA: Diagnosis not present

## 2023-07-20 DIAGNOSIS — J9601 Acute respiratory failure with hypoxia: Secondary | ICD-10-CM | POA: Diagnosis not present

## 2023-07-20 DIAGNOSIS — A403 Sepsis due to Streptococcus pneumoniae: Secondary | ICD-10-CM | POA: Diagnosis not present

## 2023-07-20 DIAGNOSIS — F02818 Dementia in other diseases classified elsewhere, unspecified severity, with other behavioral disturbance: Secondary | ICD-10-CM | POA: Diagnosis not present

## 2023-07-20 DIAGNOSIS — G1221 Amyotrophic lateral sclerosis: Secondary | ICD-10-CM | POA: Diagnosis not present

## 2023-07-20 DIAGNOSIS — J15212 Pneumonia due to Methicillin resistant Staphylococcus aureus: Secondary | ICD-10-CM | POA: Diagnosis not present

## 2023-07-20 DIAGNOSIS — G3109 Other frontotemporal dementia: Secondary | ICD-10-CM | POA: Diagnosis not present

## 2023-07-20 DIAGNOSIS — B9710 Unspecified enterovirus as the cause of diseases classified elsewhere: Secondary | ICD-10-CM | POA: Diagnosis not present

## 2023-07-20 NOTE — Progress Notes (Unsigned)
 OV 05/23/2023  Subjective:  Patient ID: Guy Payne, male , DOB: [birth year] , age 73 y.o. , MRN: [id number] , ADDRESS: [address] [place] [address] PCP Mordechai April, DO Patient Care Team: Mordechai April, DO as PCP - General (Family Medicine)  This Provider for this visit: Treatment Team:  Attending Provider: Maire Scot, MD    05/23/2023 -   Chief Complaint  Patient presents with   Pulmonary Consult    Cough x 2 wks- not able to produce any sputum. Worse after eating.      HPI Guy Payne 73 y.o. -presents with his wife.  Wife is independent historian because he has ALS.  He is got weak phonic musculature.  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  For the last few months he has had a cough.  In January 2025 underwent PEG tube replacement.  He still able to walk with a walker sometimes even without a walker.  But he has extremely weak voice.  He is able to communicate.  He has a pre-existing diagnosis of frontotemporal dementia but his wife says she is very smart and is able to comprehend passages and instructions so she is surprised about the diagnosis.  For the last few months current issues cough.  For the last 10 days it is worse its constant.  Gets worse when he lies down.  She showed a video of him coughing it was a weak cough.  Few days ago he coughed for 12 hours straight through the course of the night.  They thought the PEG tube was misplaced but they went to the ER couple of days ago and this was cleared.  He had a chest x-ray in the ER that I personally visualized and it is clear.   No h of asthma No history of heart failure but no echo either No history of ACE inhibitor intake He is taking omega-3 fatty acids Denies any acid reflux with there was some burning with PEG tube-she is not on any PPI/H2 blockade  Cough quality is extremely weak and is unable to bring up anything  No fever Non-smoker No sick contacts no  cold   Als has DOE few months   PFT      No data to display         Clinical Data:  Chest pain and short of breath.  Rule out pulmonary  embolism    CT ANGIOGRAPHY CHEST WITH CONTRAST    Technique:  Multidetector CT imaging of the chest was performed  using the standard protocol during bolus administration of  intravenous contrast.  Multiplanar CT image reconstructions  including MIPs were obtained to evaluate the vascular anatomy.    Contrast:  125 ml Omnipaque-300 IV    Comparison:  None    Findings:  Negative for pulmonary embolism.  Negative for aortic  dissection or aneurysm.  Heart size is normal.  Mild coronary  artery calcification and aortic arch calcification.    Negative for infiltrate or effusion.  Partially calcified nodule  the right upper lobe compatible with granuloma.  Negative for  adenopathy or mass.  Lungs are otherwise clear.    Review of the MIP images confirms the above findings.    IMPRESSION:  Negative for pulmonary embolism.    Right upper lobe granuloma.    No acute abnormality.   Provider: Beauty Bourbon     LAB RESULTS last 96 hours DG  Chest Port 1 View Result Date: 05/19/2023 CLINICAL DATA:  Cough, aspiration EXAM: PORTABLE CHEST 1 VIEW COMPARISON:  12/24/2009 FINDINGS: Heart and mediastinal contours are within normal limits. No focal opacities or effusions. No acute bony abnormality. IMPRESSION: No active cardiopulmonary disease. Electronically Signed   By: Janeece Mechanic M.D.   On: 05/19/2023 18:14   DG ABDOMEN PEG TUBE LOCATION Result Date: 05/19/2023 CLINICAL DATA:  [id number] Aspiration into airway, initial encounter [id number] Dislodged gastrostomy tube [id number] EXAM: ABDOMEN - 1 VIEW COMPARISON:  None available FINDINGS: Gastrostomy tube projects over the left upper quadrant and stomach. Contrast noted within the stomach. No contrast extravasation. No free air or evidence of bowel obstruction. No organomegaly. IMPRESSION:  Gastrostomy tube within the stomach. No evidence of contrast extravasation. Electronically Signed   By: Janeece Mechanic M.D.   On: 05/19/2023 18:14    IMPRESSION: No sign of penetration or aspiration.   Please refer to the Speech Pathologists report for complete details and recommendations.     Electronically Signed   By: Zara Heymann M.D.   On: 12/19/2019 14:23  OV 07/20/2023  Subjective:  Patient ID: Guy Payne, male , DOB: [birth year] , age 75 y.o. , MRN: [id number] , ADDRESS: [address] [place] [address] PCP Mordechai April, DO Patient Care Team: Mordechai April, DO as PCP - General (Family Medicine)  This Provider for this visit: Treatment Team:  Attending Provider: Maire Scot, MD    07/20/2023 -  No chief complaint on file.    HPI Guy Payne 73 y.o. -    CT Chest data from date: ****  - personally visualized and independently interpreted : *** - my findings are: ***   PFT      No data to display             LAB RESULTS last 96 hours No results found.       has a past medical history of ALS (amyotrophic lateral sclerosis) (HCC), Fall (2021), MCI (mild cognitive impairment), Memory loss, Prostate disorder, and Restless leg.   reports that he has never smoked. He has never used smokeless tobacco.  Past Surgical History:  Procedure Laterality Date   genital surgery     was not specific   NO PAST SURGERIES     prostate repair for blockage      No Known Allergies  Immunization History  Administered Date(s) Administered   Janssen (J&J) SARS-COV-2 Vaccination 06/01/2019   Tdap 04/20/2023   Zoster Recombinant(Shingrix) 03/09/2018, 07/23/2018    Family History  Problem Relation Age of Onset   Dementia Neg Hx      Current Outpatient Medications:    acetaminophen  (TYLENOL ) 500 MG tablet, Take 500-1,000 mg by mouth every 6 (six) hours as needed for moderate pain (pain score 4-6)., Disp: , Rfl:    donepezil   ([name] ) 10 MG tablet, TAKE 1 TABLET BY MOUTH EVERYDAY AT BEDTIME, Disp: 90 tablet, Rfl: 3   glycopyrrolate  (ROBINUL ) 1 MG tablet, Take 1-2 mg by mouth See admin instructions. Take 1mg  (1 tablet) by mouth in the mornings and 2mg  (2 tablets) in the evening., Disp: , Rfl:    guaifenesin  (ROBITUSSIN) 100 MG/5ML syrup, Take 200 mg by mouth 3 (three) times daily as needed for cough., Disp: , Rfl:    ipratropium-albuterol  (DUONEB) 0.5-2.5 (3) MG/3ML SOLN, Take 3 mLs by nebulization every 6 (six) hours as needed. (Patient taking differently: Take 3 mLs by nebulization 3 (three) times daily.), Disp: 360 mL, Rfl:  2   loratadine (CLARITIN) 10 MG tablet, Take 10 mg by mouth daily as needed for allergies., Disp: , Rfl:   Current Facility-Administered Medications:    ipratropium-albuterol  (DUONEB) 0.5-2.5 (3) MG/3ML nebulizer solution 3 mL, 3 mL, Nebulization, Q6H PRN,       Objective:   There were no vitals filed for this visit.  Estimated body mass index is 19.08 kg/m as calculated from the following:   Height as of 06/20/23: 5\' 8"  (1.727 m).   Weight as of 06/27/23: 125 lb 8 oz (56.9 kg).  @WEIGHTCHANGE @  There were no vitals filed for this visit.   Physical Exam   General: No distress. *** O2 at rest: *** Cane present: *** Sitting in wheel chair: *** Frail: *** Obese: *** Neuro: Alert and Oriented x 3. GCS 15. Speech normal Psych: Pleasant Resp:  Barrel Chest - ***.  Wheeze - ***, Crackles - ***, No overt respiratory distress CVS: Normal heart sounds. Murmurs - *** Ext: Stigmata of Connective Tissue Disease - *** HEENT: Normal upper airway. PEERL +. No post nasal drip        Assessment:     No diagnosis found.     Plan:     Patient Instructions     ICD-10-CM   1. Chronic cough  R05.3 CBC w/Diff    Perennial allergen profile IgE    B Nat Peptide    CT Chest High Resolution    ECHOCARDIOGRAM COMPLETE    D-Dimer, Quantitative    2. DOE (dyspnea on exertion)  R06.09  D-Dimer, Quantitative    3. ALS (amyotrophic lateral sclerosis) (HCC)  G12.21 CBC w/Diff    Perennial allergen profile IgE    B Nat Peptide    CT Chest High Resolution    ECHOCARDIOGRAM COMPLETE    D-Dimer, Quantitative    4. Frequent falls  R29.6 CBC w/Diff    Perennial allergen profile IgE    B Nat Peptide    CT Chest High Resolution    ECHOCARDIOGRAM COMPLETE    D-Dimer, Quantitative    5. Unsteady gait  R26.81 CBC w/Diff    Perennial allergen profile IgE    B Nat Peptide    CT Chest High Resolution    ECHOCARDIOGRAM COMPLETE    D-Dimer, Quantitative    6. Dysphagia, unspecified type  R13.10 CBC w/Diff    Perennial allergen profile IgE    B Nat Peptide    CT Chest High Resolution    ECHOCARDIOGRAM COMPLETE    D-Dimer, Quantitative       Chronic cough can be for regular reasons such as postviral reactive cough, asthma, heart failure but also can be ALS related with weak musculature  Plan - Do blood work - Do echocardiogram - Do CT scan of the chest - Noted that you cannot do pulmonary function test -Try 5-day course of prednisone  [check with neurologist if it is okay to take] - Do bronchodilator DuoNeb 4 times a day as needed for palliative relief of cough -Addendum we will have him stop omega-3 - Addendum: Will have him try some acid reflux medication over-the-counter.   Follow-up - 2 to 4 weeks with nurse practitioner Dr. Bertrum Brodie to review results.    FOLLOWUP No follow-ups on file.    SIGNATURE    Dr. Maire Scot, M.D., F.C.C.P,  Pulmonary and Critical Care Medicine Staff Physician, [facility] Center Director - Interstitial Lung Disease  Program  Pulmonary Fibrosis [facility] [place]  Pulmonary [place], [place], [address]  Pager: [phone], If no answer or between  15:00h - 7:00h: call [phone] Telephone: [phone]  6:14 PM 07/20/2023   Moderate Complexity MDM OFFICE  2021 E/M guidelines, first  released in 2021, with minor revisions added in 2023 and 2024 Must meet the requirements for 2 out of 3 dimensions to qualify.    Number and complexity of problems addressed Amount and/or complexity of data reviewed Risk of complications and/or morbidity  One or more chronic illness with mild exacerbation, OR progression, OR  side effects of treatment  Two or more stable chronic illnesses  One undiagnosed new problem with uncertain prognosis  One acute illness with systemic symptoms   One Acute complicated injury Must meet the requirements for 1 of 3 of the categories)  Category 1: Tests and documents, historian  Any combination of 3 of the following:  Assessment requiring an independent historian  Review of prior external note(s) from each unique source  Review of results of each unique test  Ordering of each unique test    Category 2: Interpretation of tests   Independent interpretation of a test performed by another physician/other qualified health care professional (not separately reported)  Category 3: Discuss management/tests  Discussion of management or test interpretation with external physician/other qualified health care professional/appropriate source (not separately reported) Moderate risk of morbidity from additional diagnostic testing or treatment Examples only:  Prescription drug management  Decision regarding minor surgery with identfied patient or procedure risk factors  Decision regarding elective major surgery without identified patient or procedure risk factors  Diagnosis or treatment significantly limited by social determinants of health             HIGh Complexity  OFFICE   2021 E/M guidelines, first released in 2021, with minor revisions added in 2023. Must meet the requirements for 2 out of 3 dimensions to qualify.    Number and complexity of problems addressed Amount and/or complexity of data reviewed Risk of complications and/or  morbidity  Severe exacerbation of chronic illness  Acute or chronic illnesses that may pose a threat to life or bodily function, e.g., multiple trauma, acute MI, pulmonary embolus, severe respiratory distress, progressive rheumatoid arthritis, psychiatric illness with potential threat to self or others, peritonitis, acute renal failure, abrupt change in neurological status Must meet the requirements for 2 of 3 of the categories)  Category 1: Tests and documents, historian  Any combination of 3 of the following:  Assessment requiring an independent historian  Review of prior external note(s) from each unique source  Review of results of each unique test  Ordering of each unique test    Category 2: Interpretation of tests    Independent interpretation of a test performed by another physician/other qualified health care professional (not separately reported)  Category 3: Discuss management/tests  Discussion of management or test interpretation with external physician/other qualified health care professional/appropriate source (not separately reported)  HIGH risk of morbidity from additional diagnostic testing or treatment Examples only:  Drug therapy requiring intensive monitoring for toxicity  Decision for elective major surgery with identified pateint or procedure risk factors  Decision regarding hospitalization or escalation of level of care  Decision for DNR or to de-escalate care   Parenteral controlled  substances            LEGEND - Independent interpretation involves the interpretation of a test for which there is a CPT code, and an interpretation  or report is customary. When a review and interpretation of a test is performed and documented by the provider, but not separately reported (billed), then this would represent an independent interpretation. This report does not need to conform to the usual standards of a complete report of the test. This does not  include interpretation of tests that do not have formal reports such as a complete blood count with differential and blood cultures. Examples would include reviewing a chest radiograph and documenting in the medical record an interpretation, but not separately reporting (billing) the interpretation of the chest radiograph.   An appropriate source includes professionals who are not health care professionals but may be involved in the management of the patient, such as a Clinical research associate, upper officer, case manager or teacher, and does not include discussion with family or informal caregivers.    - SDOH: SDOH are the conditions in the environments where people are born, live, learn, work, play, worship, and age that affect a wide range of health, functioning, and quality-of-life outcomes and risks. (e.g., housing, food insecurity, transportation, etc.). SDOH-related Z codes ranging from Z55-Z65 are the ICD-10-CM diagnosis codes used to document SDOH data Z55 - Problems related to education and literacy Z56 - Problems related to employment and unemployment Z57 - Occupational exposure to risk factors Z58 - Problems related to physical environment Z59 - Problems related to housing and economic circumstances [phone] - Problems related to social environment [phone] - Problems related to upbringing [phone] - Other problems related to primary support group, including family circumstances Z56 - Problems related to certain psychosocial circumstances Z65 - Problems related to other psychosocial circumstances

## 2023-07-20 NOTE — Patient Instructions (Signed)
 ICD-10-CM   1. Chronic cough  R05.3 CBC w/Diff    Perennial allergen profile IgE    B Nat Peptide    CT Chest High Resolution    ECHOCARDIOGRAM COMPLETE    D-Dimer, Quantitative    2. DOE (dyspnea on exertion)  R06.09 D-Dimer, Quantitative    3. ALS (amyotrophic lateral sclerosis) (HCC)  G12.21 CBC w/Diff    Perennial allergen profile IgE    B Nat Peptide    CT Chest High Resolution    ECHOCARDIOGRAM COMPLETE    D-Dimer, Quantitative    4. Frequent falls  R29.6 CBC w/Diff    Perennial allergen profile IgE    B Nat Peptide    CT Chest High Resolution    ECHOCARDIOGRAM COMPLETE    D-Dimer, Quantitative    5. Unsteady gait  R26.81 CBC w/Diff    Perennial allergen profile IgE    B Nat Peptide    CT Chest High Resolution    ECHOCARDIOGRAM COMPLETE    D-Dimer, Quantitative    6. Dysphagia, unspecified type  R13.10 CBC w/Diff    Perennial allergen profile IgE    B Nat Peptide    CT Chest High Resolution    ECHOCARDIOGRAM COMPLETE    D-Dimer, Quantitative       Chronic cough can be for regular reasons such as postviral reactive cough, asthma, heart failure but also can be ALS related with weak musculature  Plan - Do blood work - Do echocardiogram - Do CT scan of the chest - Noted that you cannot do pulmonary function test -Try 5-day course of prednisone [check with neurologist if it is okay to take] - Do bronchodilator DuoNeb 4 times a day as needed for palliative relief of cough -Addendum we will have him stop omega-3 - Addendum: Will have him try some acid reflux medication over-the-counter.   Follow-up - 2 to 4 weeks with nurse practitioner Dr. Marchelle Gearing to review results.

## 2023-07-21 ENCOUNTER — Encounter: Payer: Self-pay | Admitting: Internal Medicine

## 2023-07-21 ENCOUNTER — Ambulatory Visit

## 2023-07-21 ENCOUNTER — Ambulatory Visit: Admitting: Internal Medicine

## 2023-07-21 VITALS — BP 94/60 | HR 74 | Ht 68.0 in | Wt 124.0 lb

## 2023-07-21 DIAGNOSIS — R053 Chronic cough: Secondary | ICD-10-CM | POA: Diagnosis not present

## 2023-07-21 DIAGNOSIS — G1221 Amyotrophic lateral sclerosis: Secondary | ICD-10-CM | POA: Diagnosis not present

## 2023-07-21 DIAGNOSIS — Z09 Encounter for follow-up examination after completed treatment for conditions other than malignant neoplasm: Secondary | ICD-10-CM

## 2023-07-21 DIAGNOSIS — A403 Sepsis due to Streptococcus pneumoniae: Secondary | ICD-10-CM | POA: Diagnosis not present

## 2023-07-21 DIAGNOSIS — F02818 Dementia in other diseases classified elsewhere, unspecified severity, with other behavioral disturbance: Secondary | ICD-10-CM | POA: Diagnosis not present

## 2023-07-21 DIAGNOSIS — J9601 Acute respiratory failure with hypoxia: Secondary | ICD-10-CM | POA: Diagnosis not present

## 2023-07-21 DIAGNOSIS — B348 Other viral infections of unspecified site: Secondary | ICD-10-CM | POA: Diagnosis not present

## 2023-07-21 DIAGNOSIS — G3109 Other frontotemporal dementia: Secondary | ICD-10-CM | POA: Diagnosis not present

## 2023-07-21 DIAGNOSIS — R918 Other nonspecific abnormal finding of lung field: Secondary | ICD-10-CM | POA: Diagnosis not present

## 2023-07-21 DIAGNOSIS — Z8709 Personal history of other diseases of the respiratory system: Secondary | ICD-10-CM | POA: Diagnosis not present

## 2023-07-21 DIAGNOSIS — J15212 Pneumonia due to Methicillin resistant Staphylococcus aureus: Secondary | ICD-10-CM | POA: Diagnosis not present

## 2023-07-21 DIAGNOSIS — G4761 Periodic limb movement disorder: Secondary | ICD-10-CM | POA: Diagnosis not present

## 2023-07-21 DIAGNOSIS — B9710 Unspecified enterovirus as the cause of diseases classified elsewhere: Secondary | ICD-10-CM | POA: Diagnosis not present

## 2023-07-21 DIAGNOSIS — J988 Other specified respiratory disorders: Secondary | ICD-10-CM | POA: Diagnosis not present

## 2023-07-24 DIAGNOSIS — J9601 Acute respiratory failure with hypoxia: Secondary | ICD-10-CM | POA: Diagnosis not present

## 2023-07-24 DIAGNOSIS — J15212 Pneumonia due to Methicillin resistant Staphylococcus aureus: Secondary | ICD-10-CM | POA: Diagnosis not present

## 2023-07-24 DIAGNOSIS — F02818 Dementia in other diseases classified elsewhere, unspecified severity, with other behavioral disturbance: Secondary | ICD-10-CM | POA: Diagnosis not present

## 2023-07-24 DIAGNOSIS — G4761 Periodic limb movement disorder: Secondary | ICD-10-CM | POA: Diagnosis not present

## 2023-07-24 DIAGNOSIS — A403 Sepsis due to Streptococcus pneumoniae: Secondary | ICD-10-CM | POA: Diagnosis not present

## 2023-07-24 DIAGNOSIS — G1221 Amyotrophic lateral sclerosis: Secondary | ICD-10-CM | POA: Diagnosis not present

## 2023-07-24 DIAGNOSIS — B9710 Unspecified enterovirus as the cause of diseases classified elsewhere: Secondary | ICD-10-CM | POA: Diagnosis not present

## 2023-07-24 DIAGNOSIS — G3109 Other frontotemporal dementia: Secondary | ICD-10-CM | POA: Diagnosis not present

## 2023-07-24 DIAGNOSIS — J988 Other specified respiratory disorders: Secondary | ICD-10-CM | POA: Diagnosis not present

## 2023-07-26 DIAGNOSIS — A403 Sepsis due to Streptococcus pneumoniae: Secondary | ICD-10-CM | POA: Diagnosis not present

## 2023-07-26 DIAGNOSIS — G1221 Amyotrophic lateral sclerosis: Secondary | ICD-10-CM | POA: Diagnosis not present

## 2023-07-26 DIAGNOSIS — G3109 Other frontotemporal dementia: Secondary | ICD-10-CM | POA: Diagnosis not present

## 2023-07-26 DIAGNOSIS — G4761 Periodic limb movement disorder: Secondary | ICD-10-CM | POA: Diagnosis not present

## 2023-07-26 DIAGNOSIS — F02818 Dementia in other diseases classified elsewhere, unspecified severity, with other behavioral disturbance: Secondary | ICD-10-CM | POA: Diagnosis not present

## 2023-07-26 DIAGNOSIS — J988 Other specified respiratory disorders: Secondary | ICD-10-CM | POA: Diagnosis not present

## 2023-07-26 DIAGNOSIS — J15212 Pneumonia due to Methicillin resistant Staphylococcus aureus: Secondary | ICD-10-CM | POA: Diagnosis not present

## 2023-07-26 DIAGNOSIS — J9601 Acute respiratory failure with hypoxia: Secondary | ICD-10-CM | POA: Diagnosis not present

## 2023-07-26 DIAGNOSIS — B9710 Unspecified enterovirus as the cause of diseases classified elsewhere: Secondary | ICD-10-CM | POA: Diagnosis not present

## 2023-07-28 DIAGNOSIS — G1221 Amyotrophic lateral sclerosis: Secondary | ICD-10-CM | POA: Diagnosis not present

## 2023-07-28 DIAGNOSIS — J9601 Acute respiratory failure with hypoxia: Secondary | ICD-10-CM | POA: Diagnosis not present

## 2023-07-28 DIAGNOSIS — J15212 Pneumonia due to Methicillin resistant Staphylococcus aureus: Secondary | ICD-10-CM | POA: Diagnosis not present

## 2023-07-28 DIAGNOSIS — A403 Sepsis due to Streptococcus pneumoniae: Secondary | ICD-10-CM | POA: Diagnosis not present

## 2023-07-28 DIAGNOSIS — G4761 Periodic limb movement disorder: Secondary | ICD-10-CM | POA: Diagnosis not present

## 2023-07-28 DIAGNOSIS — F02818 Dementia in other diseases classified elsewhere, unspecified severity, with other behavioral disturbance: Secondary | ICD-10-CM | POA: Diagnosis not present

## 2023-07-28 DIAGNOSIS — J988 Other specified respiratory disorders: Secondary | ICD-10-CM | POA: Diagnosis not present

## 2023-07-28 DIAGNOSIS — B9710 Unspecified enterovirus as the cause of diseases classified elsewhere: Secondary | ICD-10-CM | POA: Diagnosis not present

## 2023-07-28 DIAGNOSIS — G3109 Other frontotemporal dementia: Secondary | ICD-10-CM | POA: Diagnosis not present

## 2023-07-31 DIAGNOSIS — G3109 Other frontotemporal dementia: Secondary | ICD-10-CM | POA: Diagnosis not present

## 2023-07-31 DIAGNOSIS — J988 Other specified respiratory disorders: Secondary | ICD-10-CM | POA: Diagnosis not present

## 2023-07-31 DIAGNOSIS — A403 Sepsis due to Streptococcus pneumoniae: Secondary | ICD-10-CM | POA: Diagnosis not present

## 2023-07-31 DIAGNOSIS — B9710 Unspecified enterovirus as the cause of diseases classified elsewhere: Secondary | ICD-10-CM | POA: Diagnosis not present

## 2023-07-31 DIAGNOSIS — G1221 Amyotrophic lateral sclerosis: Secondary | ICD-10-CM | POA: Diagnosis not present

## 2023-07-31 DIAGNOSIS — F02818 Dementia in other diseases classified elsewhere, unspecified severity, with other behavioral disturbance: Secondary | ICD-10-CM | POA: Diagnosis not present

## 2023-07-31 DIAGNOSIS — J9601 Acute respiratory failure with hypoxia: Secondary | ICD-10-CM | POA: Diagnosis not present

## 2023-07-31 DIAGNOSIS — G4761 Periodic limb movement disorder: Secondary | ICD-10-CM | POA: Diagnosis not present

## 2023-07-31 DIAGNOSIS — J15212 Pneumonia due to Methicillin resistant Staphylococcus aureus: Secondary | ICD-10-CM | POA: Diagnosis not present

## 2023-08-02 DIAGNOSIS — B9710 Unspecified enterovirus as the cause of diseases classified elsewhere: Secondary | ICD-10-CM | POA: Diagnosis not present

## 2023-08-02 DIAGNOSIS — F02818 Dementia in other diseases classified elsewhere, unspecified severity, with other behavioral disturbance: Secondary | ICD-10-CM | POA: Diagnosis not present

## 2023-08-02 DIAGNOSIS — G1221 Amyotrophic lateral sclerosis: Secondary | ICD-10-CM | POA: Diagnosis not present

## 2023-08-02 DIAGNOSIS — J15212 Pneumonia due to Methicillin resistant Staphylococcus aureus: Secondary | ICD-10-CM | POA: Diagnosis not present

## 2023-08-02 DIAGNOSIS — G3109 Other frontotemporal dementia: Secondary | ICD-10-CM | POA: Diagnosis not present

## 2023-08-02 DIAGNOSIS — A403 Sepsis due to Streptococcus pneumoniae: Secondary | ICD-10-CM | POA: Diagnosis not present

## 2023-08-02 DIAGNOSIS — J988 Other specified respiratory disorders: Secondary | ICD-10-CM | POA: Diagnosis not present

## 2023-08-02 DIAGNOSIS — J9601 Acute respiratory failure with hypoxia: Secondary | ICD-10-CM | POA: Diagnosis not present

## 2023-08-02 DIAGNOSIS — G4761 Periodic limb movement disorder: Secondary | ICD-10-CM | POA: Diagnosis not present

## 2023-08-04 DIAGNOSIS — A403 Sepsis due to Streptococcus pneumoniae: Secondary | ICD-10-CM | POA: Diagnosis not present

## 2023-08-04 DIAGNOSIS — F02818 Dementia in other diseases classified elsewhere, unspecified severity, with other behavioral disturbance: Secondary | ICD-10-CM | POA: Diagnosis not present

## 2023-08-04 DIAGNOSIS — J9601 Acute respiratory failure with hypoxia: Secondary | ICD-10-CM | POA: Diagnosis not present

## 2023-08-04 DIAGNOSIS — J988 Other specified respiratory disorders: Secondary | ICD-10-CM | POA: Diagnosis not present

## 2023-08-04 DIAGNOSIS — G1221 Amyotrophic lateral sclerosis: Secondary | ICD-10-CM | POA: Diagnosis not present

## 2023-08-04 DIAGNOSIS — J15212 Pneumonia due to Methicillin resistant Staphylococcus aureus: Secondary | ICD-10-CM | POA: Diagnosis not present

## 2023-08-04 DIAGNOSIS — G3109 Other frontotemporal dementia: Secondary | ICD-10-CM | POA: Diagnosis not present

## 2023-08-04 DIAGNOSIS — G4761 Periodic limb movement disorder: Secondary | ICD-10-CM | POA: Diagnosis not present

## 2023-08-04 DIAGNOSIS — B9710 Unspecified enterovirus as the cause of diseases classified elsewhere: Secondary | ICD-10-CM | POA: Diagnosis not present

## 2023-08-07 DIAGNOSIS — B9710 Unspecified enterovirus as the cause of diseases classified elsewhere: Secondary | ICD-10-CM | POA: Diagnosis not present

## 2023-08-07 DIAGNOSIS — J9601 Acute respiratory failure with hypoxia: Secondary | ICD-10-CM | POA: Diagnosis not present

## 2023-08-07 DIAGNOSIS — J988 Other specified respiratory disorders: Secondary | ICD-10-CM | POA: Diagnosis not present

## 2023-08-07 DIAGNOSIS — J15212 Pneumonia due to Methicillin resistant Staphylococcus aureus: Secondary | ICD-10-CM | POA: Diagnosis not present

## 2023-08-07 DIAGNOSIS — G3109 Other frontotemporal dementia: Secondary | ICD-10-CM | POA: Diagnosis not present

## 2023-08-07 DIAGNOSIS — F02818 Dementia in other diseases classified elsewhere, unspecified severity, with other behavioral disturbance: Secondary | ICD-10-CM | POA: Diagnosis not present

## 2023-08-07 DIAGNOSIS — A403 Sepsis due to Streptococcus pneumoniae: Secondary | ICD-10-CM | POA: Diagnosis not present

## 2023-08-07 DIAGNOSIS — G4761 Periodic limb movement disorder: Secondary | ICD-10-CM | POA: Diagnosis not present

## 2023-08-07 DIAGNOSIS — G1221 Amyotrophic lateral sclerosis: Secondary | ICD-10-CM | POA: Diagnosis not present

## 2023-08-08 DIAGNOSIS — B9710 Unspecified enterovirus as the cause of diseases classified elsewhere: Secondary | ICD-10-CM | POA: Diagnosis not present

## 2023-08-08 DIAGNOSIS — F02818 Dementia in other diseases classified elsewhere, unspecified severity, with other behavioral disturbance: Secondary | ICD-10-CM | POA: Diagnosis not present

## 2023-08-08 DIAGNOSIS — J9601 Acute respiratory failure with hypoxia: Secondary | ICD-10-CM | POA: Diagnosis not present

## 2023-08-08 DIAGNOSIS — J15212 Pneumonia due to Methicillin resistant Staphylococcus aureus: Secondary | ICD-10-CM | POA: Diagnosis not present

## 2023-08-08 DIAGNOSIS — A403 Sepsis due to Streptococcus pneumoniae: Secondary | ICD-10-CM | POA: Diagnosis not present

## 2023-08-08 DIAGNOSIS — G1221 Amyotrophic lateral sclerosis: Secondary | ICD-10-CM | POA: Diagnosis not present

## 2023-08-08 DIAGNOSIS — J988 Other specified respiratory disorders: Secondary | ICD-10-CM | POA: Diagnosis not present

## 2023-08-08 DIAGNOSIS — G3109 Other frontotemporal dementia: Secondary | ICD-10-CM | POA: Diagnosis not present

## 2023-08-08 DIAGNOSIS — G4761 Periodic limb movement disorder: Secondary | ICD-10-CM | POA: Diagnosis not present

## 2023-08-09 DIAGNOSIS — R1312 Dysphagia, oropharyngeal phase: Secondary | ICD-10-CM | POA: Diagnosis not present

## 2023-08-09 DIAGNOSIS — G3109 Other frontotemporal dementia: Secondary | ICD-10-CM | POA: Diagnosis not present

## 2023-08-09 DIAGNOSIS — G4761 Periodic limb movement disorder: Secondary | ICD-10-CM | POA: Diagnosis not present

## 2023-08-09 DIAGNOSIS — F02818 Dementia in other diseases classified elsewhere, unspecified severity, with other behavioral disturbance: Secondary | ICD-10-CM | POA: Diagnosis not present

## 2023-08-09 DIAGNOSIS — A403 Sepsis due to Streptococcus pneumoniae: Secondary | ICD-10-CM | POA: Diagnosis not present

## 2023-08-09 DIAGNOSIS — B9710 Unspecified enterovirus as the cause of diseases classified elsewhere: Secondary | ICD-10-CM | POA: Diagnosis not present

## 2023-08-09 DIAGNOSIS — G1221 Amyotrophic lateral sclerosis: Secondary | ICD-10-CM | POA: Diagnosis not present

## 2023-08-09 DIAGNOSIS — J9601 Acute respiratory failure with hypoxia: Secondary | ICD-10-CM | POA: Diagnosis not present

## 2023-08-09 DIAGNOSIS — J988 Other specified respiratory disorders: Secondary | ICD-10-CM | POA: Diagnosis not present

## 2023-08-09 DIAGNOSIS — J15212 Pneumonia due to Methicillin resistant Staphylococcus aureus: Secondary | ICD-10-CM | POA: Diagnosis not present

## 2023-08-11 DIAGNOSIS — G1221 Amyotrophic lateral sclerosis: Secondary | ICD-10-CM | POA: Diagnosis not present

## 2023-08-22 DIAGNOSIS — G1221 Amyotrophic lateral sclerosis: Secondary | ICD-10-CM | POA: Diagnosis not present

## 2023-08-22 DIAGNOSIS — J988 Other specified respiratory disorders: Secondary | ICD-10-CM | POA: Diagnosis not present

## 2023-08-22 DIAGNOSIS — A403 Sepsis due to Streptococcus pneumoniae: Secondary | ICD-10-CM | POA: Diagnosis not present

## 2023-08-22 DIAGNOSIS — F02818 Dementia in other diseases classified elsewhere, unspecified severity, with other behavioral disturbance: Secondary | ICD-10-CM | POA: Diagnosis not present

## 2023-08-22 DIAGNOSIS — E782 Mixed hyperlipidemia: Secondary | ICD-10-CM | POA: Diagnosis not present

## 2023-08-22 DIAGNOSIS — J15212 Pneumonia due to Methicillin resistant Staphylococcus aureus: Secondary | ICD-10-CM | POA: Diagnosis not present

## 2023-08-22 DIAGNOSIS — G3109 Other frontotemporal dementia: Secondary | ICD-10-CM | POA: Diagnosis not present

## 2023-08-22 DIAGNOSIS — J9601 Acute respiratory failure with hypoxia: Secondary | ICD-10-CM | POA: Diagnosis not present

## 2023-08-22 DIAGNOSIS — G4761 Periodic limb movement disorder: Secondary | ICD-10-CM | POA: Diagnosis not present

## 2023-08-22 DIAGNOSIS — B9710 Unspecified enterovirus as the cause of diseases classified elsewhere: Secondary | ICD-10-CM | POA: Diagnosis not present

## 2023-08-23 DIAGNOSIS — J988 Other specified respiratory disorders: Secondary | ICD-10-CM | POA: Diagnosis not present

## 2023-08-23 DIAGNOSIS — F02818 Dementia in other diseases classified elsewhere, unspecified severity, with other behavioral disturbance: Secondary | ICD-10-CM | POA: Diagnosis not present

## 2023-08-23 DIAGNOSIS — G1221 Amyotrophic lateral sclerosis: Secondary | ICD-10-CM | POA: Diagnosis not present

## 2023-08-23 DIAGNOSIS — J15212 Pneumonia due to Methicillin resistant Staphylococcus aureus: Secondary | ICD-10-CM | POA: Diagnosis not present

## 2023-08-23 DIAGNOSIS — G4761 Periodic limb movement disorder: Secondary | ICD-10-CM | POA: Diagnosis not present

## 2023-08-23 DIAGNOSIS — J9601 Acute respiratory failure with hypoxia: Secondary | ICD-10-CM | POA: Diagnosis not present

## 2023-08-23 DIAGNOSIS — G3109 Other frontotemporal dementia: Secondary | ICD-10-CM | POA: Diagnosis not present

## 2023-08-23 DIAGNOSIS — A403 Sepsis due to Streptococcus pneumoniae: Secondary | ICD-10-CM | POA: Diagnosis not present

## 2023-08-23 DIAGNOSIS — B9710 Unspecified enterovirus as the cause of diseases classified elsewhere: Secondary | ICD-10-CM | POA: Diagnosis not present

## 2023-08-24 DIAGNOSIS — A403 Sepsis due to Streptococcus pneumoniae: Secondary | ICD-10-CM | POA: Diagnosis not present

## 2023-08-24 DIAGNOSIS — G1221 Amyotrophic lateral sclerosis: Secondary | ICD-10-CM | POA: Diagnosis not present

## 2023-08-24 DIAGNOSIS — G4761 Periodic limb movement disorder: Secondary | ICD-10-CM | POA: Diagnosis not present

## 2023-08-24 DIAGNOSIS — J9601 Acute respiratory failure with hypoxia: Secondary | ICD-10-CM | POA: Diagnosis not present

## 2023-08-24 DIAGNOSIS — G3109 Other frontotemporal dementia: Secondary | ICD-10-CM | POA: Diagnosis not present

## 2023-08-24 DIAGNOSIS — J15212 Pneumonia due to Methicillin resistant Staphylococcus aureus: Secondary | ICD-10-CM | POA: Diagnosis not present

## 2023-08-24 DIAGNOSIS — J988 Other specified respiratory disorders: Secondary | ICD-10-CM | POA: Diagnosis not present

## 2023-08-24 DIAGNOSIS — B9710 Unspecified enterovirus as the cause of diseases classified elsewhere: Secondary | ICD-10-CM | POA: Diagnosis not present

## 2023-08-24 DIAGNOSIS — F02818 Dementia in other diseases classified elsewhere, unspecified severity, with other behavioral disturbance: Secondary | ICD-10-CM | POA: Diagnosis not present

## 2023-08-28 DIAGNOSIS — J9601 Acute respiratory failure with hypoxia: Secondary | ICD-10-CM | POA: Diagnosis not present

## 2023-08-28 DIAGNOSIS — A403 Sepsis due to Streptococcus pneumoniae: Secondary | ICD-10-CM | POA: Diagnosis not present

## 2023-08-28 DIAGNOSIS — G1221 Amyotrophic lateral sclerosis: Secondary | ICD-10-CM | POA: Diagnosis not present

## 2023-08-28 DIAGNOSIS — J988 Other specified respiratory disorders: Secondary | ICD-10-CM | POA: Diagnosis not present

## 2023-08-28 DIAGNOSIS — G3109 Other frontotemporal dementia: Secondary | ICD-10-CM | POA: Diagnosis not present

## 2023-08-28 DIAGNOSIS — G4761 Periodic limb movement disorder: Secondary | ICD-10-CM | POA: Diagnosis not present

## 2023-08-28 DIAGNOSIS — F02818 Dementia in other diseases classified elsewhere, unspecified severity, with other behavioral disturbance: Secondary | ICD-10-CM | POA: Diagnosis not present

## 2023-08-28 DIAGNOSIS — B9710 Unspecified enterovirus as the cause of diseases classified elsewhere: Secondary | ICD-10-CM | POA: Diagnosis not present

## 2023-08-28 DIAGNOSIS — J15212 Pneumonia due to Methicillin resistant Staphylococcus aureus: Secondary | ICD-10-CM | POA: Diagnosis not present

## 2023-08-29 DIAGNOSIS — J988 Other specified respiratory disorders: Secondary | ICD-10-CM | POA: Diagnosis not present

## 2023-08-29 DIAGNOSIS — J15212 Pneumonia due to Methicillin resistant Staphylococcus aureus: Secondary | ICD-10-CM | POA: Diagnosis not present

## 2023-08-29 DIAGNOSIS — J9601 Acute respiratory failure with hypoxia: Secondary | ICD-10-CM | POA: Diagnosis not present

## 2023-08-29 DIAGNOSIS — A403 Sepsis due to Streptococcus pneumoniae: Secondary | ICD-10-CM | POA: Diagnosis not present

## 2023-08-29 DIAGNOSIS — F02818 Dementia in other diseases classified elsewhere, unspecified severity, with other behavioral disturbance: Secondary | ICD-10-CM | POA: Diagnosis not present

## 2023-08-29 DIAGNOSIS — G4761 Periodic limb movement disorder: Secondary | ICD-10-CM | POA: Diagnosis not present

## 2023-08-29 DIAGNOSIS — G3109 Other frontotemporal dementia: Secondary | ICD-10-CM | POA: Diagnosis not present

## 2023-08-29 DIAGNOSIS — B9710 Unspecified enterovirus as the cause of diseases classified elsewhere: Secondary | ICD-10-CM | POA: Diagnosis not present

## 2023-08-29 DIAGNOSIS — G1221 Amyotrophic lateral sclerosis: Secondary | ICD-10-CM | POA: Diagnosis not present

## 2023-08-30 DIAGNOSIS — R252 Cramp and spasm: Secondary | ICD-10-CM | POA: Diagnosis not present

## 2023-08-30 DIAGNOSIS — G1221 Amyotrophic lateral sclerosis: Secondary | ICD-10-CM | POA: Diagnosis not present

## 2023-08-31 DIAGNOSIS — J988 Other specified respiratory disorders: Secondary | ICD-10-CM | POA: Diagnosis not present

## 2023-08-31 DIAGNOSIS — G1221 Amyotrophic lateral sclerosis: Secondary | ICD-10-CM | POA: Diagnosis not present

## 2023-08-31 DIAGNOSIS — F02818 Dementia in other diseases classified elsewhere, unspecified severity, with other behavioral disturbance: Secondary | ICD-10-CM | POA: Diagnosis not present

## 2023-08-31 DIAGNOSIS — J9601 Acute respiratory failure with hypoxia: Secondary | ICD-10-CM | POA: Diagnosis not present

## 2023-08-31 DIAGNOSIS — B9710 Unspecified enterovirus as the cause of diseases classified elsewhere: Secondary | ICD-10-CM | POA: Diagnosis not present

## 2023-08-31 DIAGNOSIS — G3109 Other frontotemporal dementia: Secondary | ICD-10-CM | POA: Diagnosis not present

## 2023-08-31 DIAGNOSIS — G4761 Periodic limb movement disorder: Secondary | ICD-10-CM | POA: Diagnosis not present

## 2023-08-31 DIAGNOSIS — A403 Sepsis due to Streptococcus pneumoniae: Secondary | ICD-10-CM | POA: Diagnosis not present

## 2023-08-31 DIAGNOSIS — J15212 Pneumonia due to Methicillin resistant Staphylococcus aureus: Secondary | ICD-10-CM | POA: Diagnosis not present

## 2023-09-01 DIAGNOSIS — A403 Sepsis due to Streptococcus pneumoniae: Secondary | ICD-10-CM | POA: Diagnosis not present

## 2023-09-01 DIAGNOSIS — J9601 Acute respiratory failure with hypoxia: Secondary | ICD-10-CM | POA: Diagnosis not present

## 2023-09-01 DIAGNOSIS — J988 Other specified respiratory disorders: Secondary | ICD-10-CM | POA: Diagnosis not present

## 2023-09-01 DIAGNOSIS — B9710 Unspecified enterovirus as the cause of diseases classified elsewhere: Secondary | ICD-10-CM | POA: Diagnosis not present

## 2023-09-01 DIAGNOSIS — G3109 Other frontotemporal dementia: Secondary | ICD-10-CM | POA: Diagnosis not present

## 2023-09-01 DIAGNOSIS — J15212 Pneumonia due to Methicillin resistant Staphylococcus aureus: Secondary | ICD-10-CM | POA: Diagnosis not present

## 2023-09-01 DIAGNOSIS — G4761 Periodic limb movement disorder: Secondary | ICD-10-CM | POA: Diagnosis not present

## 2023-09-01 DIAGNOSIS — F02818 Dementia in other diseases classified elsewhere, unspecified severity, with other behavioral disturbance: Secondary | ICD-10-CM | POA: Diagnosis not present

## 2023-09-01 DIAGNOSIS — G1221 Amyotrophic lateral sclerosis: Secondary | ICD-10-CM | POA: Diagnosis not present

## 2023-09-05 DIAGNOSIS — G4761 Periodic limb movement disorder: Secondary | ICD-10-CM | POA: Diagnosis not present

## 2023-09-05 DIAGNOSIS — G1221 Amyotrophic lateral sclerosis: Secondary | ICD-10-CM | POA: Diagnosis not present

## 2023-09-05 DIAGNOSIS — J15212 Pneumonia due to Methicillin resistant Staphylococcus aureus: Secondary | ICD-10-CM | POA: Diagnosis not present

## 2023-09-05 DIAGNOSIS — G3109 Other frontotemporal dementia: Secondary | ICD-10-CM | POA: Diagnosis not present

## 2023-09-05 DIAGNOSIS — B9710 Unspecified enterovirus as the cause of diseases classified elsewhere: Secondary | ICD-10-CM | POA: Diagnosis not present

## 2023-09-05 DIAGNOSIS — A403 Sepsis due to Streptococcus pneumoniae: Secondary | ICD-10-CM | POA: Diagnosis not present

## 2023-09-05 DIAGNOSIS — J9601 Acute respiratory failure with hypoxia: Secondary | ICD-10-CM | POA: Diagnosis not present

## 2023-09-05 DIAGNOSIS — F02818 Dementia in other diseases classified elsewhere, unspecified severity, with other behavioral disturbance: Secondary | ICD-10-CM | POA: Diagnosis not present

## 2023-09-05 DIAGNOSIS — J988 Other specified respiratory disorders: Secondary | ICD-10-CM | POA: Diagnosis not present

## 2023-09-06 DIAGNOSIS — A403 Sepsis due to Streptococcus pneumoniae: Secondary | ICD-10-CM | POA: Diagnosis not present

## 2023-09-06 DIAGNOSIS — G4761 Periodic limb movement disorder: Secondary | ICD-10-CM | POA: Diagnosis not present

## 2023-09-06 DIAGNOSIS — B9710 Unspecified enterovirus as the cause of diseases classified elsewhere: Secondary | ICD-10-CM | POA: Diagnosis not present

## 2023-09-06 DIAGNOSIS — J15212 Pneumonia due to Methicillin resistant Staphylococcus aureus: Secondary | ICD-10-CM | POA: Diagnosis not present

## 2023-09-06 DIAGNOSIS — G1221 Amyotrophic lateral sclerosis: Secondary | ICD-10-CM | POA: Diagnosis not present

## 2023-09-06 DIAGNOSIS — F02818 Dementia in other diseases classified elsewhere, unspecified severity, with other behavioral disturbance: Secondary | ICD-10-CM | POA: Diagnosis not present

## 2023-09-06 DIAGNOSIS — J9601 Acute respiratory failure with hypoxia: Secondary | ICD-10-CM | POA: Diagnosis not present

## 2023-09-06 DIAGNOSIS — J988 Other specified respiratory disorders: Secondary | ICD-10-CM | POA: Diagnosis not present

## 2023-09-06 DIAGNOSIS — G3109 Other frontotemporal dementia: Secondary | ICD-10-CM | POA: Diagnosis not present

## 2023-09-10 DIAGNOSIS — G1221 Amyotrophic lateral sclerosis: Secondary | ICD-10-CM | POA: Diagnosis not present

## 2023-09-11 DIAGNOSIS — G3109 Other frontotemporal dementia: Secondary | ICD-10-CM | POA: Diagnosis not present

## 2023-09-11 DIAGNOSIS — A403 Sepsis due to Streptococcus pneumoniae: Secondary | ICD-10-CM | POA: Diagnosis not present

## 2023-09-11 DIAGNOSIS — J988 Other specified respiratory disorders: Secondary | ICD-10-CM | POA: Diagnosis not present

## 2023-09-11 DIAGNOSIS — J15212 Pneumonia due to Methicillin resistant Staphylococcus aureus: Secondary | ICD-10-CM | POA: Diagnosis not present

## 2023-09-11 DIAGNOSIS — J9601 Acute respiratory failure with hypoxia: Secondary | ICD-10-CM | POA: Diagnosis not present

## 2023-09-11 DIAGNOSIS — G1221 Amyotrophic lateral sclerosis: Secondary | ICD-10-CM | POA: Diagnosis not present

## 2023-09-11 DIAGNOSIS — B9710 Unspecified enterovirus as the cause of diseases classified elsewhere: Secondary | ICD-10-CM | POA: Diagnosis not present

## 2023-09-11 DIAGNOSIS — G4761 Periodic limb movement disorder: Secondary | ICD-10-CM | POA: Diagnosis not present

## 2023-09-11 DIAGNOSIS — F02818 Dementia in other diseases classified elsewhere, unspecified severity, with other behavioral disturbance: Secondary | ICD-10-CM | POA: Diagnosis not present

## 2023-09-12 DIAGNOSIS — G1221 Amyotrophic lateral sclerosis: Secondary | ICD-10-CM | POA: Diagnosis not present

## 2023-09-12 DIAGNOSIS — J9601 Acute respiratory failure with hypoxia: Secondary | ICD-10-CM | POA: Diagnosis not present

## 2023-09-12 DIAGNOSIS — G3109 Other frontotemporal dementia: Secondary | ICD-10-CM | POA: Diagnosis not present

## 2023-09-12 DIAGNOSIS — B9710 Unspecified enterovirus as the cause of diseases classified elsewhere: Secondary | ICD-10-CM | POA: Diagnosis not present

## 2023-09-12 DIAGNOSIS — F02818 Dementia in other diseases classified elsewhere, unspecified severity, with other behavioral disturbance: Secondary | ICD-10-CM | POA: Diagnosis not present

## 2023-09-12 DIAGNOSIS — G4761 Periodic limb movement disorder: Secondary | ICD-10-CM | POA: Diagnosis not present

## 2023-09-12 DIAGNOSIS — A403 Sepsis due to Streptococcus pneumoniae: Secondary | ICD-10-CM | POA: Diagnosis not present

## 2023-09-12 DIAGNOSIS — J988 Other specified respiratory disorders: Secondary | ICD-10-CM | POA: Diagnosis not present

## 2023-09-12 DIAGNOSIS — J15212 Pneumonia due to Methicillin resistant Staphylococcus aureus: Secondary | ICD-10-CM | POA: Diagnosis not present

## 2023-09-13 DIAGNOSIS — J9601 Acute respiratory failure with hypoxia: Secondary | ICD-10-CM | POA: Diagnosis not present

## 2023-09-13 DIAGNOSIS — A403 Sepsis due to Streptococcus pneumoniae: Secondary | ICD-10-CM | POA: Diagnosis not present

## 2023-09-13 DIAGNOSIS — B9710 Unspecified enterovirus as the cause of diseases classified elsewhere: Secondary | ICD-10-CM | POA: Diagnosis not present

## 2023-09-13 DIAGNOSIS — G4761 Periodic limb movement disorder: Secondary | ICD-10-CM | POA: Diagnosis not present

## 2023-09-13 DIAGNOSIS — J988 Other specified respiratory disorders: Secondary | ICD-10-CM | POA: Diagnosis not present

## 2023-09-13 DIAGNOSIS — F02818 Dementia in other diseases classified elsewhere, unspecified severity, with other behavioral disturbance: Secondary | ICD-10-CM | POA: Diagnosis not present

## 2023-09-13 DIAGNOSIS — G3109 Other frontotemporal dementia: Secondary | ICD-10-CM | POA: Diagnosis not present

## 2023-09-13 DIAGNOSIS — J15212 Pneumonia due to Methicillin resistant Staphylococcus aureus: Secondary | ICD-10-CM | POA: Diagnosis not present

## 2023-09-13 DIAGNOSIS — G1221 Amyotrophic lateral sclerosis: Secondary | ICD-10-CM | POA: Diagnosis not present

## 2023-09-14 DIAGNOSIS — G1221 Amyotrophic lateral sclerosis: Secondary | ICD-10-CM | POA: Diagnosis not present

## 2023-09-18 DIAGNOSIS — G1221 Amyotrophic lateral sclerosis: Secondary | ICD-10-CM | POA: Diagnosis not present

## 2023-09-18 DIAGNOSIS — R1312 Dysphagia, oropharyngeal phase: Secondary | ICD-10-CM | POA: Diagnosis not present

## 2023-09-20 DIAGNOSIS — I10 Essential (primary) hypertension: Secondary | ICD-10-CM | POA: Diagnosis not present

## 2023-09-20 DIAGNOSIS — G4761 Periodic limb movement disorder: Secondary | ICD-10-CM | POA: Diagnosis not present

## 2023-09-20 DIAGNOSIS — G3109 Other frontotemporal dementia: Secondary | ICD-10-CM | POA: Diagnosis not present

## 2023-09-20 DIAGNOSIS — M47819 Spondylosis without myelopathy or radiculopathy, site unspecified: Secondary | ICD-10-CM | POA: Diagnosis not present

## 2023-09-20 DIAGNOSIS — F02818 Dementia in other diseases classified elsewhere, unspecified severity, with other behavioral disturbance: Secondary | ICD-10-CM | POA: Diagnosis not present

## 2023-09-20 DIAGNOSIS — J439 Emphysema, unspecified: Secondary | ICD-10-CM | POA: Diagnosis not present

## 2023-09-20 DIAGNOSIS — Z431 Encounter for attention to gastrostomy: Secondary | ICD-10-CM | POA: Diagnosis not present

## 2023-09-20 DIAGNOSIS — G1221 Amyotrophic lateral sclerosis: Secondary | ICD-10-CM | POA: Diagnosis not present

## 2023-09-20 DIAGNOSIS — R1312 Dysphagia, oropharyngeal phase: Secondary | ICD-10-CM | POA: Diagnosis not present

## 2023-09-25 ENCOUNTER — Other Ambulatory Visit: Payer: Self-pay | Admitting: Internal Medicine

## 2023-09-25 DIAGNOSIS — R053 Chronic cough: Secondary | ICD-10-CM

## 2023-09-25 DIAGNOSIS — G1221 Amyotrophic lateral sclerosis: Secondary | ICD-10-CM

## 2023-09-25 DIAGNOSIS — R0609 Other forms of dyspnea: Secondary | ICD-10-CM

## 2023-09-25 DIAGNOSIS — R1310 Dysphagia, unspecified: Secondary | ICD-10-CM

## 2023-10-02 DIAGNOSIS — G4761 Periodic limb movement disorder: Secondary | ICD-10-CM | POA: Diagnosis not present

## 2023-10-02 DIAGNOSIS — J439 Emphysema, unspecified: Secondary | ICD-10-CM | POA: Diagnosis not present

## 2023-10-02 DIAGNOSIS — G3109 Other frontotemporal dementia: Secondary | ICD-10-CM | POA: Diagnosis not present

## 2023-10-02 DIAGNOSIS — I10 Essential (primary) hypertension: Secondary | ICD-10-CM | POA: Diagnosis not present

## 2023-10-02 DIAGNOSIS — M47819 Spondylosis without myelopathy or radiculopathy, site unspecified: Secondary | ICD-10-CM | POA: Diagnosis not present

## 2023-10-02 DIAGNOSIS — Z431 Encounter for attention to gastrostomy: Secondary | ICD-10-CM | POA: Diagnosis not present

## 2023-10-02 DIAGNOSIS — F02818 Dementia in other diseases classified elsewhere, unspecified severity, with other behavioral disturbance: Secondary | ICD-10-CM | POA: Diagnosis not present

## 2023-10-02 DIAGNOSIS — R1312 Dysphagia, oropharyngeal phase: Secondary | ICD-10-CM | POA: Diagnosis not present

## 2023-10-02 DIAGNOSIS — G1221 Amyotrophic lateral sclerosis: Secondary | ICD-10-CM | POA: Diagnosis not present

## 2023-10-04 DIAGNOSIS — G1221 Amyotrophic lateral sclerosis: Secondary | ICD-10-CM | POA: Diagnosis not present

## 2023-10-06 DIAGNOSIS — Z431 Encounter for attention to gastrostomy: Secondary | ICD-10-CM | POA: Diagnosis not present

## 2023-10-06 DIAGNOSIS — G1221 Amyotrophic lateral sclerosis: Secondary | ICD-10-CM | POA: Diagnosis not present

## 2023-10-06 DIAGNOSIS — G4761 Periodic limb movement disorder: Secondary | ICD-10-CM | POA: Diagnosis not present

## 2023-10-06 DIAGNOSIS — R1312 Dysphagia, oropharyngeal phase: Secondary | ICD-10-CM | POA: Diagnosis not present

## 2023-10-06 DIAGNOSIS — I10 Essential (primary) hypertension: Secondary | ICD-10-CM | POA: Diagnosis not present

## 2023-10-06 DIAGNOSIS — M47819 Spondylosis without myelopathy or radiculopathy, site unspecified: Secondary | ICD-10-CM | POA: Diagnosis not present

## 2023-10-06 DIAGNOSIS — J439 Emphysema, unspecified: Secondary | ICD-10-CM | POA: Diagnosis not present

## 2023-10-06 DIAGNOSIS — G3109 Other frontotemporal dementia: Secondary | ICD-10-CM | POA: Diagnosis not present

## 2023-10-06 DIAGNOSIS — F02818 Dementia in other diseases classified elsewhere, unspecified severity, with other behavioral disturbance: Secondary | ICD-10-CM | POA: Diagnosis not present

## 2023-10-09 DIAGNOSIS — R1312 Dysphagia, oropharyngeal phase: Secondary | ICD-10-CM | POA: Diagnosis not present

## 2023-10-09 DIAGNOSIS — M47819 Spondylosis without myelopathy or radiculopathy, site unspecified: Secondary | ICD-10-CM | POA: Diagnosis not present

## 2023-10-09 DIAGNOSIS — I10 Essential (primary) hypertension: Secondary | ICD-10-CM | POA: Diagnosis not present

## 2023-10-09 DIAGNOSIS — G1221 Amyotrophic lateral sclerosis: Secondary | ICD-10-CM | POA: Diagnosis not present

## 2023-10-09 DIAGNOSIS — Z431 Encounter for attention to gastrostomy: Secondary | ICD-10-CM | POA: Diagnosis not present

## 2023-10-09 DIAGNOSIS — F02818 Dementia in other diseases classified elsewhere, unspecified severity, with other behavioral disturbance: Secondary | ICD-10-CM | POA: Diagnosis not present

## 2023-10-09 DIAGNOSIS — J439 Emphysema, unspecified: Secondary | ICD-10-CM | POA: Diagnosis not present

## 2023-10-09 DIAGNOSIS — G3109 Other frontotemporal dementia: Secondary | ICD-10-CM | POA: Diagnosis not present

## 2023-10-09 DIAGNOSIS — G4761 Periodic limb movement disorder: Secondary | ICD-10-CM | POA: Diagnosis not present

## 2023-10-11 DIAGNOSIS — F02818 Dementia in other diseases classified elsewhere, unspecified severity, with other behavioral disturbance: Secondary | ICD-10-CM | POA: Diagnosis not present

## 2023-10-11 DIAGNOSIS — M47819 Spondylosis without myelopathy or radiculopathy, site unspecified: Secondary | ICD-10-CM | POA: Diagnosis not present

## 2023-10-11 DIAGNOSIS — G3109 Other frontotemporal dementia: Secondary | ICD-10-CM | POA: Diagnosis not present

## 2023-10-11 DIAGNOSIS — I10 Essential (primary) hypertension: Secondary | ICD-10-CM | POA: Diagnosis not present

## 2023-10-11 DIAGNOSIS — J439 Emphysema, unspecified: Secondary | ICD-10-CM | POA: Diagnosis not present

## 2023-10-11 DIAGNOSIS — G4761 Periodic limb movement disorder: Secondary | ICD-10-CM | POA: Diagnosis not present

## 2023-10-11 DIAGNOSIS — Z431 Encounter for attention to gastrostomy: Secondary | ICD-10-CM | POA: Diagnosis not present

## 2023-10-11 DIAGNOSIS — G1221 Amyotrophic lateral sclerosis: Secondary | ICD-10-CM | POA: Diagnosis not present

## 2023-10-11 DIAGNOSIS — R1312 Dysphagia, oropharyngeal phase: Secondary | ICD-10-CM | POA: Diagnosis not present

## 2023-10-13 DIAGNOSIS — F02818 Dementia in other diseases classified elsewhere, unspecified severity, with other behavioral disturbance: Secondary | ICD-10-CM | POA: Diagnosis not present

## 2023-10-13 DIAGNOSIS — J439 Emphysema, unspecified: Secondary | ICD-10-CM | POA: Diagnosis not present

## 2023-10-13 DIAGNOSIS — Z431 Encounter for attention to gastrostomy: Secondary | ICD-10-CM | POA: Diagnosis not present

## 2023-10-13 DIAGNOSIS — M47819 Spondylosis without myelopathy or radiculopathy, site unspecified: Secondary | ICD-10-CM | POA: Diagnosis not present

## 2023-10-13 DIAGNOSIS — G3109 Other frontotemporal dementia: Secondary | ICD-10-CM | POA: Diagnosis not present

## 2023-10-13 DIAGNOSIS — I10 Essential (primary) hypertension: Secondary | ICD-10-CM | POA: Diagnosis not present

## 2023-10-13 DIAGNOSIS — G1221 Amyotrophic lateral sclerosis: Secondary | ICD-10-CM | POA: Diagnosis not present

## 2023-10-13 DIAGNOSIS — R1312 Dysphagia, oropharyngeal phase: Secondary | ICD-10-CM | POA: Diagnosis not present

## 2023-10-13 DIAGNOSIS — G4761 Periodic limb movement disorder: Secondary | ICD-10-CM | POA: Diagnosis not present

## 2023-10-14 DIAGNOSIS — G1221 Amyotrophic lateral sclerosis: Secondary | ICD-10-CM | POA: Diagnosis not present

## 2023-10-17 DIAGNOSIS — J439 Emphysema, unspecified: Secondary | ICD-10-CM | POA: Diagnosis not present

## 2023-10-17 DIAGNOSIS — G1221 Amyotrophic lateral sclerosis: Secondary | ICD-10-CM | POA: Diagnosis not present

## 2023-10-17 DIAGNOSIS — M47819 Spondylosis without myelopathy or radiculopathy, site unspecified: Secondary | ICD-10-CM | POA: Diagnosis not present

## 2023-10-17 DIAGNOSIS — G3109 Other frontotemporal dementia: Secondary | ICD-10-CM | POA: Diagnosis not present

## 2023-10-17 DIAGNOSIS — F02818 Dementia in other diseases classified elsewhere, unspecified severity, with other behavioral disturbance: Secondary | ICD-10-CM | POA: Diagnosis not present

## 2023-10-17 DIAGNOSIS — I10 Essential (primary) hypertension: Secondary | ICD-10-CM | POA: Diagnosis not present

## 2023-10-17 DIAGNOSIS — Z431 Encounter for attention to gastrostomy: Secondary | ICD-10-CM | POA: Diagnosis not present

## 2023-10-17 DIAGNOSIS — G4761 Periodic limb movement disorder: Secondary | ICD-10-CM | POA: Diagnosis not present

## 2023-10-17 DIAGNOSIS — R1312 Dysphagia, oropharyngeal phase: Secondary | ICD-10-CM | POA: Diagnosis not present

## 2023-10-19 DIAGNOSIS — R1312 Dysphagia, oropharyngeal phase: Secondary | ICD-10-CM | POA: Diagnosis not present

## 2023-10-19 DIAGNOSIS — J439 Emphysema, unspecified: Secondary | ICD-10-CM | POA: Diagnosis not present

## 2023-10-19 DIAGNOSIS — G4761 Periodic limb movement disorder: Secondary | ICD-10-CM | POA: Diagnosis not present

## 2023-10-19 DIAGNOSIS — Z431 Encounter for attention to gastrostomy: Secondary | ICD-10-CM | POA: Diagnosis not present

## 2023-10-19 DIAGNOSIS — M47819 Spondylosis without myelopathy or radiculopathy, site unspecified: Secondary | ICD-10-CM | POA: Diagnosis not present

## 2023-10-19 DIAGNOSIS — I10 Essential (primary) hypertension: Secondary | ICD-10-CM | POA: Diagnosis not present

## 2023-10-19 DIAGNOSIS — F02818 Dementia in other diseases classified elsewhere, unspecified severity, with other behavioral disturbance: Secondary | ICD-10-CM | POA: Diagnosis not present

## 2023-10-19 DIAGNOSIS — G3109 Other frontotemporal dementia: Secondary | ICD-10-CM | POA: Diagnosis not present

## 2023-10-19 DIAGNOSIS — G1221 Amyotrophic lateral sclerosis: Secondary | ICD-10-CM | POA: Diagnosis not present

## 2023-10-23 DIAGNOSIS — G1221 Amyotrophic lateral sclerosis: Secondary | ICD-10-CM | POA: Diagnosis not present

## 2023-10-23 DIAGNOSIS — R1312 Dysphagia, oropharyngeal phase: Secondary | ICD-10-CM | POA: Diagnosis not present

## 2023-10-25 DIAGNOSIS — F02818 Dementia in other diseases classified elsewhere, unspecified severity, with other behavioral disturbance: Secondary | ICD-10-CM | POA: Diagnosis not present

## 2023-10-25 DIAGNOSIS — G3109 Other frontotemporal dementia: Secondary | ICD-10-CM | POA: Diagnosis not present

## 2023-10-25 DIAGNOSIS — G1221 Amyotrophic lateral sclerosis: Secondary | ICD-10-CM | POA: Diagnosis not present

## 2023-10-25 DIAGNOSIS — Z431 Encounter for attention to gastrostomy: Secondary | ICD-10-CM | POA: Diagnosis not present

## 2023-10-25 DIAGNOSIS — R1312 Dysphagia, oropharyngeal phase: Secondary | ICD-10-CM | POA: Diagnosis not present

## 2023-10-25 DIAGNOSIS — G4761 Periodic limb movement disorder: Secondary | ICD-10-CM | POA: Diagnosis not present

## 2023-10-25 DIAGNOSIS — I10 Essential (primary) hypertension: Secondary | ICD-10-CM | POA: Diagnosis not present

## 2023-10-25 DIAGNOSIS — M47819 Spondylosis without myelopathy or radiculopathy, site unspecified: Secondary | ICD-10-CM | POA: Diagnosis not present

## 2023-10-25 DIAGNOSIS — J439 Emphysema, unspecified: Secondary | ICD-10-CM | POA: Diagnosis not present

## 2023-10-26 DIAGNOSIS — Z431 Encounter for attention to gastrostomy: Secondary | ICD-10-CM | POA: Diagnosis not present

## 2023-10-26 DIAGNOSIS — J439 Emphysema, unspecified: Secondary | ICD-10-CM | POA: Diagnosis not present

## 2023-10-26 DIAGNOSIS — G4761 Periodic limb movement disorder: Secondary | ICD-10-CM | POA: Diagnosis not present

## 2023-10-26 DIAGNOSIS — I10 Essential (primary) hypertension: Secondary | ICD-10-CM | POA: Diagnosis not present

## 2023-10-26 DIAGNOSIS — G3109 Other frontotemporal dementia: Secondary | ICD-10-CM | POA: Diagnosis not present

## 2023-10-26 DIAGNOSIS — R1312 Dysphagia, oropharyngeal phase: Secondary | ICD-10-CM | POA: Diagnosis not present

## 2023-10-26 DIAGNOSIS — G1221 Amyotrophic lateral sclerosis: Secondary | ICD-10-CM | POA: Diagnosis not present

## 2023-10-26 DIAGNOSIS — F02818 Dementia in other diseases classified elsewhere, unspecified severity, with other behavioral disturbance: Secondary | ICD-10-CM | POA: Diagnosis not present

## 2023-10-26 DIAGNOSIS — M47819 Spondylosis without myelopathy or radiculopathy, site unspecified: Secondary | ICD-10-CM | POA: Diagnosis not present

## 2023-10-30 ENCOUNTER — Ambulatory Visit: Payer: Self-pay | Admitting: Internal Medicine

## 2023-11-08 DIAGNOSIS — F02818 Dementia in other diseases classified elsewhere, unspecified severity, with other behavioral disturbance: Secondary | ICD-10-CM | POA: Diagnosis not present

## 2023-11-08 DIAGNOSIS — M47819 Spondylosis without myelopathy or radiculopathy, site unspecified: Secondary | ICD-10-CM | POA: Diagnosis not present

## 2023-11-08 DIAGNOSIS — G4761 Periodic limb movement disorder: Secondary | ICD-10-CM | POA: Diagnosis not present

## 2023-11-08 DIAGNOSIS — G1221 Amyotrophic lateral sclerosis: Secondary | ICD-10-CM | POA: Diagnosis not present

## 2023-11-08 DIAGNOSIS — I10 Essential (primary) hypertension: Secondary | ICD-10-CM | POA: Diagnosis not present

## 2023-11-08 DIAGNOSIS — J439 Emphysema, unspecified: Secondary | ICD-10-CM | POA: Diagnosis not present

## 2023-11-08 DIAGNOSIS — R1312 Dysphagia, oropharyngeal phase: Secondary | ICD-10-CM | POA: Diagnosis not present

## 2023-11-08 DIAGNOSIS — G3109 Other frontotemporal dementia: Secondary | ICD-10-CM | POA: Diagnosis not present

## 2023-11-08 DIAGNOSIS — Z431 Encounter for attention to gastrostomy: Secondary | ICD-10-CM | POA: Diagnosis not present

## 2023-11-09 DIAGNOSIS — D225 Melanocytic nevi of trunk: Secondary | ICD-10-CM | POA: Diagnosis not present

## 2023-11-09 DIAGNOSIS — L821 Other seborrheic keratosis: Secondary | ICD-10-CM | POA: Diagnosis not present

## 2023-11-09 DIAGNOSIS — L578 Other skin changes due to chronic exposure to nonionizing radiation: Secondary | ICD-10-CM | POA: Diagnosis not present

## 2023-11-09 DIAGNOSIS — L719 Rosacea, unspecified: Secondary | ICD-10-CM | POA: Diagnosis not present

## 2023-11-11 DIAGNOSIS — G1221 Amyotrophic lateral sclerosis: Secondary | ICD-10-CM | POA: Diagnosis not present

## 2023-11-13 DIAGNOSIS — J439 Emphysema, unspecified: Secondary | ICD-10-CM | POA: Diagnosis not present

## 2023-11-13 DIAGNOSIS — G3109 Other frontotemporal dementia: Secondary | ICD-10-CM | POA: Diagnosis not present

## 2023-11-13 DIAGNOSIS — E43 Unspecified severe protein-calorie malnutrition: Secondary | ICD-10-CM | POA: Diagnosis not present

## 2023-11-13 DIAGNOSIS — I10 Essential (primary) hypertension: Secondary | ICD-10-CM | POA: Diagnosis not present

## 2023-11-13 DIAGNOSIS — M47819 Spondylosis without myelopathy or radiculopathy, site unspecified: Secondary | ICD-10-CM | POA: Diagnosis not present

## 2023-11-13 DIAGNOSIS — R1312 Dysphagia, oropharyngeal phase: Secondary | ICD-10-CM | POA: Diagnosis not present

## 2023-11-13 DIAGNOSIS — G4761 Periodic limb movement disorder: Secondary | ICD-10-CM | POA: Diagnosis not present

## 2023-11-13 DIAGNOSIS — F02818 Dementia in other diseases classified elsewhere, unspecified severity, with other behavioral disturbance: Secondary | ICD-10-CM | POA: Diagnosis not present

## 2023-11-13 DIAGNOSIS — G1221 Amyotrophic lateral sclerosis: Secondary | ICD-10-CM | POA: Diagnosis not present

## 2023-11-14 DIAGNOSIS — G1221 Amyotrophic lateral sclerosis: Secondary | ICD-10-CM | POA: Diagnosis not present

## 2023-11-15 DIAGNOSIS — R1312 Dysphagia, oropharyngeal phase: Secondary | ICD-10-CM | POA: Diagnosis not present

## 2023-11-15 DIAGNOSIS — G1221 Amyotrophic lateral sclerosis: Secondary | ICD-10-CM | POA: Diagnosis not present

## 2023-11-15 DIAGNOSIS — E43 Unspecified severe protein-calorie malnutrition: Secondary | ICD-10-CM | POA: Diagnosis not present

## 2023-11-15 DIAGNOSIS — I10 Essential (primary) hypertension: Secondary | ICD-10-CM | POA: Diagnosis not present

## 2023-11-15 DIAGNOSIS — F02818 Dementia in other diseases classified elsewhere, unspecified severity, with other behavioral disturbance: Secondary | ICD-10-CM | POA: Diagnosis not present

## 2023-11-15 DIAGNOSIS — G3109 Other frontotemporal dementia: Secondary | ICD-10-CM | POA: Diagnosis not present

## 2023-11-15 DIAGNOSIS — M47819 Spondylosis without myelopathy or radiculopathy, site unspecified: Secondary | ICD-10-CM | POA: Diagnosis not present

## 2023-11-15 DIAGNOSIS — J439 Emphysema, unspecified: Secondary | ICD-10-CM | POA: Diagnosis not present

## 2023-11-15 DIAGNOSIS — G4761 Periodic limb movement disorder: Secondary | ICD-10-CM | POA: Diagnosis not present

## 2023-11-16 DIAGNOSIS — R1312 Dysphagia, oropharyngeal phase: Secondary | ICD-10-CM | POA: Diagnosis not present

## 2023-11-16 DIAGNOSIS — G3109 Other frontotemporal dementia: Secondary | ICD-10-CM | POA: Diagnosis not present

## 2023-11-16 DIAGNOSIS — J439 Emphysema, unspecified: Secondary | ICD-10-CM | POA: Diagnosis not present

## 2023-11-16 DIAGNOSIS — E43 Unspecified severe protein-calorie malnutrition: Secondary | ICD-10-CM | POA: Diagnosis not present

## 2023-11-16 DIAGNOSIS — G1221 Amyotrophic lateral sclerosis: Secondary | ICD-10-CM | POA: Diagnosis not present

## 2023-11-16 DIAGNOSIS — G4761 Periodic limb movement disorder: Secondary | ICD-10-CM | POA: Diagnosis not present

## 2023-11-16 DIAGNOSIS — F02818 Dementia in other diseases classified elsewhere, unspecified severity, with other behavioral disturbance: Secondary | ICD-10-CM | POA: Diagnosis not present

## 2023-11-16 DIAGNOSIS — M47819 Spondylosis without myelopathy or radiculopathy, site unspecified: Secondary | ICD-10-CM | POA: Diagnosis not present

## 2023-11-16 DIAGNOSIS — I10 Essential (primary) hypertension: Secondary | ICD-10-CM | POA: Diagnosis not present

## 2023-11-17 DIAGNOSIS — E43 Unspecified severe protein-calorie malnutrition: Secondary | ICD-10-CM | POA: Diagnosis not present

## 2023-11-17 DIAGNOSIS — R1312 Dysphagia, oropharyngeal phase: Secondary | ICD-10-CM | POA: Diagnosis not present

## 2023-11-17 DIAGNOSIS — G1221 Amyotrophic lateral sclerosis: Secondary | ICD-10-CM | POA: Diagnosis not present

## 2023-11-17 DIAGNOSIS — I10 Essential (primary) hypertension: Secondary | ICD-10-CM | POA: Diagnosis not present

## 2023-11-17 DIAGNOSIS — M47819 Spondylosis without myelopathy or radiculopathy, site unspecified: Secondary | ICD-10-CM | POA: Diagnosis not present

## 2023-11-17 DIAGNOSIS — J439 Emphysema, unspecified: Secondary | ICD-10-CM | POA: Diagnosis not present

## 2023-11-17 DIAGNOSIS — G3109 Other frontotemporal dementia: Secondary | ICD-10-CM | POA: Diagnosis not present

## 2023-11-17 DIAGNOSIS — F02818 Dementia in other diseases classified elsewhere, unspecified severity, with other behavioral disturbance: Secondary | ICD-10-CM | POA: Diagnosis not present

## 2023-11-25 DIAGNOSIS — R1312 Dysphagia, oropharyngeal phase: Secondary | ICD-10-CM | POA: Diagnosis not present

## 2023-11-25 DIAGNOSIS — G1221 Amyotrophic lateral sclerosis: Secondary | ICD-10-CM | POA: Diagnosis not present

## 2023-11-28 DIAGNOSIS — G1221 Amyotrophic lateral sclerosis: Secondary | ICD-10-CM | POA: Diagnosis not present

## 2023-11-28 DIAGNOSIS — Z789 Other specified health status: Secondary | ICD-10-CM | POA: Diagnosis not present

## 2023-11-28 DIAGNOSIS — Z431 Encounter for attention to gastrostomy: Secondary | ICD-10-CM | POA: Diagnosis not present

## 2023-11-30 DIAGNOSIS — F02818 Dementia in other diseases classified elsewhere, unspecified severity, with other behavioral disturbance: Secondary | ICD-10-CM | POA: Diagnosis not present

## 2023-11-30 DIAGNOSIS — I10 Essential (primary) hypertension: Secondary | ICD-10-CM | POA: Diagnosis not present

## 2023-11-30 DIAGNOSIS — E43 Unspecified severe protein-calorie malnutrition: Secondary | ICD-10-CM | POA: Diagnosis not present

## 2023-11-30 DIAGNOSIS — M47819 Spondylosis without myelopathy or radiculopathy, site unspecified: Secondary | ICD-10-CM | POA: Diagnosis not present

## 2023-11-30 DIAGNOSIS — J439 Emphysema, unspecified: Secondary | ICD-10-CM | POA: Diagnosis not present

## 2023-11-30 DIAGNOSIS — R1312 Dysphagia, oropharyngeal phase: Secondary | ICD-10-CM | POA: Diagnosis not present

## 2023-11-30 DIAGNOSIS — G4761 Periodic limb movement disorder: Secondary | ICD-10-CM | POA: Diagnosis not present

## 2023-11-30 DIAGNOSIS — G1221 Amyotrophic lateral sclerosis: Secondary | ICD-10-CM | POA: Diagnosis not present

## 2023-12-01 DIAGNOSIS — E43 Unspecified severe protein-calorie malnutrition: Secondary | ICD-10-CM | POA: Diagnosis not present

## 2023-12-06 DIAGNOSIS — R531 Weakness: Secondary | ICD-10-CM | POA: Diagnosis not present

## 2023-12-06 DIAGNOSIS — G1221 Amyotrophic lateral sclerosis: Secondary | ICD-10-CM | POA: Diagnosis not present

## 2023-12-06 DIAGNOSIS — G4761 Periodic limb movement disorder: Secondary | ICD-10-CM | POA: Diagnosis not present

## 2023-12-06 DIAGNOSIS — E43 Unspecified severe protein-calorie malnutrition: Secondary | ICD-10-CM | POA: Diagnosis not present

## 2023-12-06 DIAGNOSIS — R509 Fever, unspecified: Secondary | ICD-10-CM | POA: Diagnosis not present

## 2023-12-06 DIAGNOSIS — S0990XA Unspecified injury of head, initial encounter: Secondary | ICD-10-CM | POA: Diagnosis not present

## 2023-12-06 DIAGNOSIS — G3109 Other frontotemporal dementia: Secondary | ICD-10-CM | POA: Diagnosis not present

## 2023-12-06 DIAGNOSIS — Z9181 History of falling: Secondary | ICD-10-CM | POA: Diagnosis not present

## 2023-12-06 DIAGNOSIS — J439 Emphysema, unspecified: Secondary | ICD-10-CM | POA: Diagnosis not present

## 2023-12-06 DIAGNOSIS — R1312 Dysphagia, oropharyngeal phase: Secondary | ICD-10-CM | POA: Diagnosis not present

## 2023-12-06 DIAGNOSIS — M47819 Spondylosis without myelopathy or radiculopathy, site unspecified: Secondary | ICD-10-CM | POA: Diagnosis not present

## 2023-12-06 DIAGNOSIS — F02818 Dementia in other diseases classified elsewhere, unspecified severity, with other behavioral disturbance: Secondary | ICD-10-CM | POA: Diagnosis not present

## 2023-12-06 DIAGNOSIS — J690 Pneumonitis due to inhalation of food and vomit: Secondary | ICD-10-CM | POA: Diagnosis not present

## 2023-12-06 DIAGNOSIS — I10 Essential (primary) hypertension: Secondary | ICD-10-CM | POA: Diagnosis not present

## 2023-12-06 DIAGNOSIS — Z681 Body mass index (BMI) 19 or less, adult: Secondary | ICD-10-CM | POA: Diagnosis not present

## 2023-12-09 DIAGNOSIS — G1221 Amyotrophic lateral sclerosis: Secondary | ICD-10-CM | POA: Diagnosis not present

## 2023-12-09 DIAGNOSIS — I10 Essential (primary) hypertension: Secondary | ICD-10-CM | POA: Diagnosis not present

## 2023-12-09 DIAGNOSIS — F02818 Dementia in other diseases classified elsewhere, unspecified severity, with other behavioral disturbance: Secondary | ICD-10-CM | POA: Diagnosis not present

## 2023-12-09 DIAGNOSIS — G3109 Other frontotemporal dementia: Secondary | ICD-10-CM | POA: Diagnosis not present

## 2023-12-09 DIAGNOSIS — G4761 Periodic limb movement disorder: Secondary | ICD-10-CM | POA: Diagnosis not present

## 2023-12-09 DIAGNOSIS — E43 Unspecified severe protein-calorie malnutrition: Secondary | ICD-10-CM | POA: Diagnosis not present

## 2023-12-09 DIAGNOSIS — R1312 Dysphagia, oropharyngeal phase: Secondary | ICD-10-CM | POA: Diagnosis not present

## 2023-12-09 DIAGNOSIS — J439 Emphysema, unspecified: Secondary | ICD-10-CM | POA: Diagnosis not present

## 2023-12-09 DIAGNOSIS — M47819 Spondylosis without myelopathy or radiculopathy, site unspecified: Secondary | ICD-10-CM | POA: Diagnosis not present

## 2023-12-11 DIAGNOSIS — G3109 Other frontotemporal dementia: Secondary | ICD-10-CM | POA: Diagnosis not present

## 2023-12-11 DIAGNOSIS — E43 Unspecified severe protein-calorie malnutrition: Secondary | ICD-10-CM | POA: Diagnosis not present

## 2023-12-11 DIAGNOSIS — I10 Essential (primary) hypertension: Secondary | ICD-10-CM | POA: Diagnosis not present

## 2023-12-11 DIAGNOSIS — M47819 Spondylosis without myelopathy or radiculopathy, site unspecified: Secondary | ICD-10-CM | POA: Diagnosis not present

## 2023-12-11 DIAGNOSIS — G4761 Periodic limb movement disorder: Secondary | ICD-10-CM | POA: Diagnosis not present

## 2023-12-11 DIAGNOSIS — G1221 Amyotrophic lateral sclerosis: Secondary | ICD-10-CM | POA: Diagnosis not present

## 2023-12-11 DIAGNOSIS — J439 Emphysema, unspecified: Secondary | ICD-10-CM | POA: Diagnosis not present

## 2023-12-11 DIAGNOSIS — F02818 Dementia in other diseases classified elsewhere, unspecified severity, with other behavioral disturbance: Secondary | ICD-10-CM | POA: Diagnosis not present

## 2023-12-11 DIAGNOSIS — R1312 Dysphagia, oropharyngeal phase: Secondary | ICD-10-CM | POA: Diagnosis not present

## 2023-12-14 DIAGNOSIS — J189 Pneumonia, unspecified organism: Secondary | ICD-10-CM | POA: Diagnosis not present

## 2023-12-14 DIAGNOSIS — Z4659 Encounter for fitting and adjustment of other gastrointestinal appliance and device: Secondary | ICD-10-CM | POA: Diagnosis not present

## 2023-12-14 DIAGNOSIS — R6520 Severe sepsis without septic shock: Secondary | ICD-10-CM | POA: Diagnosis not present

## 2023-12-14 DIAGNOSIS — B370 Candidal stomatitis: Secondary | ICD-10-CM | POA: Diagnosis not present

## 2023-12-14 DIAGNOSIS — J690 Pneumonitis due to inhalation of food and vomit: Secondary | ICD-10-CM | POA: Diagnosis not present

## 2023-12-14 DIAGNOSIS — R918 Other nonspecific abnormal finding of lung field: Secondary | ICD-10-CM | POA: Diagnosis not present

## 2023-12-14 DIAGNOSIS — J9601 Acute respiratory failure with hypoxia: Secondary | ICD-10-CM | POA: Diagnosis not present

## 2023-12-14 DIAGNOSIS — G3109 Other frontotemporal dementia: Secondary | ICD-10-CM | POA: Diagnosis not present

## 2023-12-14 DIAGNOSIS — A419 Sepsis, unspecified organism: Secondary | ICD-10-CM | POA: Diagnosis not present

## 2023-12-14 DIAGNOSIS — J188 Other pneumonia, unspecified organism: Secondary | ICD-10-CM | POA: Diagnosis not present

## 2023-12-14 DIAGNOSIS — Z8701 Personal history of pneumonia (recurrent): Secondary | ICD-10-CM | POA: Diagnosis not present

## 2023-12-14 DIAGNOSIS — F0280 Dementia in other diseases classified elsewhere without behavioral disturbance: Secondary | ICD-10-CM | POA: Diagnosis not present

## 2023-12-14 DIAGNOSIS — E872 Acidosis, unspecified: Secondary | ICD-10-CM | POA: Diagnosis not present

## 2023-12-14 DIAGNOSIS — R0602 Shortness of breath: Secondary | ICD-10-CM | POA: Diagnosis not present

## 2023-12-14 DIAGNOSIS — Z9981 Dependence on supplemental oxygen: Secondary | ICD-10-CM | POA: Diagnosis not present

## 2023-12-14 DIAGNOSIS — R000 Tachycardia, unspecified: Secondary | ICD-10-CM | POA: Diagnosis not present

## 2023-12-14 DIAGNOSIS — G1221 Amyotrophic lateral sclerosis: Secondary | ICD-10-CM | POA: Diagnosis not present

## 2023-12-15 ENCOUNTER — Telehealth: Payer: Self-pay | Admitting: Internal Medicine

## 2023-12-15 NOTE — Telephone Encounter (Signed)
 Copied from CRM [id number]. Topic: Appointments - Appointment Info/Confirmation >> Dec 15, 2023  9:22 AM Leila C wrote: Patient/patient representative is calling for information regarding an appointment.  [name] Rhoda [phone] states patient is in [facility] hospital admitted yesterday for excerbation asthma in ICU today. Rhoda states patient may be discharged tomorrow Saturday, patient will be at the office visit 12/18/23 at 9:30 am with Dr. Geronimo. If patient is not out of the hospital, Rhoda will call the office on Monday and not be marked as a no show. FYI.

## 2023-12-18 ENCOUNTER — Ambulatory Visit: Admitting: Internal Medicine

## 2023-12-18 DIAGNOSIS — R0609 Other forms of dyspnea: Secondary | ICD-10-CM

## 2023-12-18 DIAGNOSIS — R053 Chronic cough: Secondary | ICD-10-CM

## 2023-12-18 NOTE — Progress Notes (Unsigned)
 OV 05/23/2023  Subjective:  Patient ID: Guy Payne, male , DOB: Jan 28, 1951 , age 73 y.o. , MRN: [id number] , ADDRESS: [address] [place] [address] PCP Dayna Motto, DO Patient Care Team: Dayna Motto, DO as PCP - General (Family Medicine)  This Provider for this visit: Treatment Team:  Attending Provider: Geronimo Amel, MD    05/23/2023 -   Chief Complaint  Patient presents with   Pulmonary Consult    Cough x 2 wks- not able to produce any sputum. Worse after eating.      HPI Guy Payne 73 y.o. -presents with his wife.  Wife is independent historian because he has ALS.  He is got weak phonic musculature.  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  For the last few months he has had a cough.  In January 2025 underwent PEG tube replacement.  He still able to walk with a walker sometimes even without a walker.  But he has extremely weak voice.  He is able to communicate.  He has a pre-existing diagnosis of frontotemporal dementia but his wife says she is very smart and is able to comprehend passages and instructions so she is surprised about the diagnosis.  For the last few months current issues cough.  For the last 10 days it is worse its constant.  Gets worse when he lies down.  She showed a video of him coughing it was a weak cough.  Few days ago he coughed for 12 hours straight through the course of the night.  They thought the PEG tube was misplaced but they went to the ER couple of days ago and this was cleared.  He had a chest x-ray in the ER that I personally visualized and it is clear.   No h of asthma No history of heart failure but no echo either No history of ACE inhibitor intake He is taking omega-3 fatty acids Denies any acid reflux with there was some burning with PEG tube-she is not on any PPI/H2 blockade  Cough quality is extremely weak and is unable to bring up anything  No fever Non-smoker No sick contacts no  cold   Als has DOE few months   Clinical Data:  Chest pain and short of breath.  Rule out pulmonary  embolism    CT ANGIOGRAPHY CHEST WITH CONTRAST    Technique:  Multidetector CT imaging of the chest was performed  using the standard protocol during bolus administration of  intravenous contrast.  Multiplanar CT image reconstructions  including MIPs were obtained to evaluate the vascular anatomy.    Contrast:  125 ml Omnipaque-300 IV    Comparison:  None    Findings:  Negative for pulmonary embolism.  Negative for aortic  dissection or aneurysm.  Heart size is normal.  Mild coronary  artery calcification and aortic arch calcification.    Negative for infiltrate or effusion.  Partially calcified nodule  the right upper lobe compatible with granuloma.  Negative for  adenopathy or mass.  Lungs are otherwise clear.    Review of the MIP images confirms the above findings.    IMPRESSION:  Negative for pulmonary embolism.    Right upper lobe granuloma.    No acute abnormality.   Provider: Almarie Severe     LAB RESULTS last 96 hours DG Chest Port 1 View Result Date: 05/19/2023 CLINICAL DATA:  Cough, aspiration EXAM: PORTABLE CHEST 1 VIEW COMPARISON:  12/24/2009 FINDINGS: Heart and mediastinal contours are within normal limits. No focal opacities or effusions. No acute bony abnormality. IMPRESSION: No active cardiopulmonary disease. Electronically Signed   By: Franky Crease M.D.   On: 05/19/2023 18:14   DG ABDOMEN PEG TUBE LOCATION Result Date: 05/19/2023 CLINICAL DATA:  [id number] Aspiration into airway, initial encounter [id number] Dislodged gastrostomy tube [id number] EXAM: ABDOMEN - 1 VIEW COMPARISON:  None available FINDINGS: Gastrostomy tube projects over the left upper quadrant and stomach. Contrast noted within the stomach. No contrast extravasation. No free air or evidence of bowel obstruction. No organomegaly. IMPRESSION: Gastrostomy tube within the stomach. No evidence  of contrast extravasation. Electronically Signed   By: Franky Crease M.D.   On: 05/19/2023 18:14    IMPRESSION: No sign of penetration or aspiration.   Please refer to the Speech Pathologists report for complete details and recommendations.     Electronically Signed   By: Isla Blind M.D.   On: 12/19/2019 14:23  OV 07/21/2023  Subjective:  Patient ID: Guy Payne, male , DOB: 1951-01-23 , age 55 y.o. , MRN: [id number] , ADDRESS: [address] [place] [address] PCP Dayna Motto, DO Patient Care Team: Dayna Motto, DO as PCP - General (Family Medicine)  This Provider for this visit: Treatment Team:  Attending Provider: Geronimo Amel, MD    07/21/2023 -   Chief Complaint  Patient presents with   Follow-up    Dry Cough     HPI Guy Payne 73 y.o. -presents with his wife.  After I saw him in the office he ended up getting hospitalized early May 2025 with rhinovirus pneumonia and pulmonary infiltrates.  I did see him as inpatient at that time.  He is here for posthospital follow-up.  According to the wife he is doing really well.  They have been made a [place] trip near [address].  He had a good time.  Currently she is not complaining of cough although it is baseline symptom for him.  She is overall pleased with his progress.  No new complaints since her last time in the hospital.  Current baseline cough score is below.  He will need a chest x-ray for follow-up.  His chest x-ray at the time of admission showed significant infiltrates that I personally visualized again at this time.         MPRESSION: 1. No evidence of interstitial lung disease or air trapping. 2. Aortic atherosclerosis (ICD10-I70.0). Left anterior descending coronary artery calcification.     Electronically Signed   By: Newell Eke M.D.   On: 06/16/2023 08:39    OV 12/18/2023  Subjective:  Patient ID: Guy PARAS Payne, male , DOB: 01-15-1951 , age 57 y.o. , MRN:  [id number] , ADDRESS: [address] [place] [address] PCP Dayna Motto, DO Patient Care Team: Dayna Motto, DO as PCP - General (Family Medicine)  This Provider for this visit: Treatment Team:  Attending Provider: Geronimo Amel, MD    12/18/2023 -  No chief complaint on file.    HPI Guy Payne 73 y.o. -      Dr Felice Reflux Symptom Index (> 13-15 suggestive of LPR cough) 0 -> 5  =  none ->severe problem 07/21/2023   Hoarseness of problem with voice 5  Clearing  Of Throat 5  Excess throat mucus or feeling of post nasal drip 4  Difficulty swallowing food, liquid or tablets 5  Cough after eating or lying down 2  Breathing  difficulties or choking episodes 0  Troublesome or annoying cough 3  Sensation of something sticking in throat or lump in throat 3  Heartburn, chest pain, indigestion, or stomach acid coming up 3  TOTAL 30     CT Chest data from date: ****  - personally visualized and independently interpreted : *** - my findings are: ***   PFT      No data to display             LAB RESULTS last 96 hours No results found.       has a past medical history of ALS (amyotrophic lateral sclerosis) (HCC), Fall (2021), MCI (mild cognitive impairment), Memory loss, Prostate disorder, and Restless leg.   reports that he has never smoked. He has never used smokeless tobacco.  Past Surgical History:  Procedure Laterality Date   genital surgery     was not specific   NO PAST SURGERIES     prostate repair for blockage      No Known Allergies  Immunization History  Administered Date(s) Administered   Janssen (J&J) SARS-COV-2 Vaccination 06/01/2019   Tdap 04/20/2023   Zoster Recombinant(Shingrix) 03/09/2018, 07/23/2018    Family History  Problem Relation Age of Onset   Dementia Neg Hx      Current Outpatient Medications:    acetaminophen  (TYLENOL ) 500 MG tablet, Take 500-1,000 mg by mouth every 6 (six) hours as needed  for moderate pain (pain score 4-6). (Patient not taking: Reported on 07/21/2023), Disp: , Rfl:    donepezil  ([name] ) 10 MG tablet, TAKE 1 TABLET BY MOUTH EVERYDAY AT BEDTIME, Disp: 90 tablet, Rfl: 3   glycopyrrolate  (ROBINUL ) 1 MG tablet, Take 1-2 mg by mouth See admin instructions. Take 1mg  (1 tablet) by mouth in the mornings and 2mg  (2 tablets) in the evening., Disp: , Rfl:    guaifenesin  (ROBITUSSIN) 100 MG/5ML syrup, Take 200 mg by mouth 3 (three) times daily as needed for cough. (Patient not taking: Reported on 07/21/2023), Disp: , Rfl:    ipratropium-albuterol  (DUONEB) 0.5-2.5 (3) MG/3ML SOLN, INHALE 3 ML BY NEBULIZER EVERY 6 HOURS AS NEEDED, Disp: 360 mL, Rfl: 2   loratadine (CLARITIN) 10 MG tablet, Take 10 mg by mouth daily as needed for allergies., Disp: , Rfl:   Current Facility-Administered Medications:    ipratropium-albuterol  (DUONEB) 0.5-2.5 (3) MG/3ML nebulizer solution 3 mL, 3 mL, Nebulization, Q6H PRN,       Objective:   There were no vitals filed for this visit.  Estimated body mass index is 18.85 kg/m as calculated from the following:   Height as of 07/21/23: 5' 8 (1.727 m).   Weight as of 07/21/23: 124 lb (56.2 kg).  @WEIGHTCHANGE @  There were no vitals filed for this visit.   Physical Exam   General: No distress. *** O2 at rest: *** Cane present: *** Sitting in wheel chair: *** Frail: *** Obese: *** Neuro: Alert and Oriented x 3. GCS 15. Speech normal Psych: Pleasant Resp:  Barrel Chest - ***.  Wheeze - ***, Crackles - ***, No overt respiratory distress CVS: Normal heart sounds. Murmurs - *** Ext: Stigmata of Connective Tissue Disease - *** HEENT: Normal upper airway. PEERL +. No post nasal drip        Assessment/     Assessment & Plan Chronic cough  DOE (dyspnea on exertion)    PLAN Patient Instructions     ICD-10-CM   1. Chronic cough  R05.3     2. ALS (amyotrophic lateral  sclerosis) (HCC)  G12.21     3. Hospital discharge follow-up   Z09     4. History of acute respiratory failure  Z87.09     5. Rhinovirus  B34.8        Glad much better after hospitalization early MAy 2025 from Rhinovirus pneumonia Glad stable  Plan - cxr  2 view  Follow-up -4-6 months 15 miniutes    FOLLOWUP    No follow-ups on file.    SIGNATURE    Dr. Dorethia Cave, M.D., F.C.C.P,  Pulmonary and Critical Care Medicine Staff Physician, [facility] Center Director - Interstitial Lung Disease  Program  Pulmonary Fibrosis [facility] at [facility] [place], [place], [address]  Pager: [phone], If no answer or between  15:00h - 7:00h: call [phone] Telephone: [phone]  6:06 AM 12/18/2023   Moderate Complexity MDM OFFICE  2021 E/M guidelines, first released in 2021, with minor revisions added in 2023 and 2024 Must meet the requirements for 2 out of 3 dimensions to qualify.    Number and complexity of problems addressed Amount and/or complexity of data reviewed Risk of complications and/or morbidity  One or more chronic illness with mild exacerbation, OR progression, OR  side effects of treatment  Two or more stable chronic illnesses  One undiagnosed new problem with uncertain prognosis  One acute illness with systemic symptoms   One Acute complicated injury Must meet the requirements for 1 of 3 of the categories)  Category 1: Tests and documents, historian  Any combination of 3 of the following:  Assessment requiring an independent historian  Review of prior external note(s) from each unique source  Review of results of each unique test  Ordering of each unique test    Category 2: Interpretation of tests   Independent interpretation of a test performed by another physician/other qualified health care professional (not separately reported)  Category 3: Discuss management/tests  Discussion of management or test interpretation with external physician/other  qualified health care professional/appropriate source (not separately reported) Moderate risk of morbidity from additional diagnostic testing or treatment Examples only:  Prescription drug management  Decision regarding minor surgery with identfied patient or procedure risk factors  Decision regarding elective major surgery without identified patient or procedure risk factors  Diagnosis or treatment significantly limited by social determinants of health             HIGh Complexity  OFFICE   2021 E/M guidelines, first released in 2021, with minor revisions added in 2023. Must meet the requirements for 2 out of 3 dimensions to qualify.    Number and complexity of problems addressed Amount and/or complexity of data reviewed Risk of complications and/or morbidity  Severe exacerbation of chronic illness  Acute or chronic illnesses that may pose a threat to life or bodily function, e.g., multiple trauma, acute MI, pulmonary embolus, severe respiratory distress, progressive rheumatoid arthritis, psychiatric illness with potential threat to self or others, peritonitis, acute renal failure, abrupt change in neurological status Must meet the requirements for 2 of 3 of the categories)  Category 1: Tests and documents, historian  Any combination of 3 of the following:  Assessment requiring an independent historian  Review of prior external note(s) from each unique source  Review of results of each unique test  Ordering of each unique test    Category 2: Interpretation of tests    Independent interpretation of a test performed by another physician/other  qualified health care professional (not separately reported)  Category 3: Discuss management/tests  Discussion of management or test interpretation with external physician/other qualified health care professional/appropriate source (not separately reported)  HIGH risk of morbidity from additional diagnostic testing or  treatment Examples only:  Drug therapy requiring intensive monitoring for toxicity  Decision for elective major surgery with identified pateint or procedure risk factors  Decision regarding hospitalization or escalation of level of care  Decision for DNR or to de-escalate care   Parenteral controlled  substances            LEGEND - Independent interpretation involves the interpretation of a test for which there is a CPT code, and an interpretation or report is customary. When a review and interpretation of a test is performed and documented by the provider, but not separately reported (billed), then this would represent an independent interpretation. This report does not need to conform to the usual standards of a complete report of the test. This does not include interpretation of tests that do not have formal reports such as a complete blood count with differential and blood cultures. Examples would include reviewing a chest radiograph and documenting in the medical record an interpretation, but not separately reporting (billing) the interpretation of the chest radiograph.   An appropriate source includes professionals who are not health care professionals but may be involved in the management of the patient, such as a clinical research associate, upper officer, case manager or teacher, and does not include discussion with family or informal caregivers.    - SDOH: SDOH are the conditions in the environments where people are born, live, learn, work, play, worship, and age that affect a wide range of health, functioning, and quality-of-life outcomes and risks. (e.g., housing, food insecurity, transportation, etc.). SDOH-related Z codes ranging from Z55-Z65 are the ICD-10-CM diagnosis codes used to document SDOH data Z55 - Problems related to education and literacy Z56 - Problems related to employment and unemployment Z57 - Occupational exposure to risk factors Z58 - Problems related to physical  environment Z59 - Problems related to housing and economic circumstances [phone] - Problems related to social environment [phone] - Problems related to upbringing [phone] - Other problems related to primary support group, including family circumstances Z91 - Problems related to certain psychosocial circumstances Z65 - Problems related to other psychosocial circumstances

## 2023-12-18 NOTE — Patient Instructions (Signed)
 ICD-10-CM   1. Chronic cough  R05.3     2. 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 Hospital discharge follow-up  Z09     4. History of acute respiratory failure  Z87.09     5. Rhinovirus  B34.8        Glad much better after hospitalization early MAy 2025 from Rhinovirus pneumonia Glad stable  Plan - cxr  2 view  Follow-up -4-6 months 15 miniutes

## 2023-12-18 NOTE — Telephone Encounter (Signed)
 Patient is still admitted.

## 2023-12-21 ENCOUNTER — Encounter: Payer: Self-pay | Admitting: Internal Medicine

## 2023-12-21 ENCOUNTER — Ambulatory Visit: Admitting: Internal Medicine

## 2023-12-21 VITALS — BP 108/60 | HR 69 | Ht 68.0 in | Wt 126.0 lb

## 2023-12-21 DIAGNOSIS — Z8709 Personal history of other diseases of the respiratory system: Secondary | ICD-10-CM | POA: Diagnosis not present

## 2023-12-21 DIAGNOSIS — G1221 Amyotrophic lateral sclerosis: Secondary | ICD-10-CM

## 2023-12-21 DIAGNOSIS — R0609 Other forms of dyspnea: Secondary | ICD-10-CM

## 2023-12-21 DIAGNOSIS — Z09 Encounter for follow-up examination after completed treatment for conditions other than malignant neoplasm: Secondary | ICD-10-CM | POA: Diagnosis not present

## 2023-12-21 DIAGNOSIS — R053 Chronic cough: Secondary | ICD-10-CM | POA: Diagnosis not present

## 2023-12-21 NOTE — Progress Notes (Signed)
 OV 05/23/2023  Subjective:  Patient ID: Guy Payne, male , DOB: June 01, 1950 , age 73 y.o. , MRN: [id number] , ADDRESS: [address] [place] [address] PCP Dayna Motto, DO Patient Care Team: Dayna Motto, DO as PCP - General (Family Medicine)  This Provider for this visit: Treatment Team:  Attending Provider: Geronimo Amel, MD    05/23/2023 -   Chief Complaint  Patient presents with   Pulmonary Consult    Cough x 2 wks- not able to produce any sputum. Worse after eating.      HPI Guy Payne 73 y.o. -presents with his wife.  Wife is independent historian because he has ALS.  He is got weak phonic musculature.  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  For the last few months he has had a cough.  In January 2025 underwent PEG tube replacement.  He still able to walk with a walker sometimes even without a walker.  But he has extremely weak voice.  He is able to communicate.  He has a pre-existing diagnosis of frontotemporal dementia but his wife says she is very smart and is able to comprehend passages and instructions so she is surprised about the diagnosis.  For the last few months current issues cough.  For the last 10 days it is worse its constant.  Gets worse when he lies down.  She showed a video of him coughing it was a weak cough.  Few days ago he coughed for 12 hours straight through the course of the night.  They thought the PEG tube was misplaced but they went to the ER couple of days ago and this was cleared.  He had a chest x-ray in the ER that I personally visualized and it is clear.   No h of asthma No history of heart failure but no echo either No history of ACE inhibitor intake He is taking omega-3 fatty acids Denies any acid reflux with there was some burning with PEG tube-she is not on any PPI/H2 blockade  Cough quality is extremely weak and is unable to bring up anything  No fever Non-smoker No sick contacts no  cold   Als has DOE few months   Clinical Data:  Chest pain and short of breath.  Rule out pulmonary  embolism    CT ANGIOGRAPHY CHEST WITH CONTRAST    Technique:  Multidetector CT imaging of the chest was performed  using the standard protocol during bolus administration of  intravenous contrast.  Multiplanar CT image reconstructions  including MIPs were obtained to evaluate the vascular anatomy.    Contrast:  125 ml Omnipaque-300 IV    Comparison:  None    Findings:  Negative for pulmonary embolism.  Negative for aortic  dissection or aneurysm.  Heart size is normal.  Mild coronary  artery calcification and aortic arch calcification.    Negative for infiltrate or effusion.  Partially calcified nodule  the right upper lobe compatible with granuloma.  Negative for  adenopathy or mass.  Lungs are otherwise clear.    Review of the MIP images confirms the above findings.    IMPRESSION:  Negative for pulmonary embolism.    Right upper lobe granuloma.    No acute abnormality.   Provider: Almarie Severe     LAB RESULTS last 96 hours DG Chest Port 1 View Result Date: 05/19/2023 CLINICAL DATA:  Cough, aspiration EXAM: PORTABLE CHEST  1 VIEW COMPARISON:  12/24/2009 FINDINGS: Heart and mediastinal contours are within normal limits. No focal opacities or effusions. No acute bony abnormality. IMPRESSION: No active cardiopulmonary disease. Electronically Signed   By: Franky Crease M.D.   On: 05/19/2023 18:14   DG ABDOMEN PEG TUBE LOCATION Result Date: 05/19/2023 CLINICAL DATA:  [id number] Aspiration into airway, initial encounter [id number] Dislodged gastrostomy tube [id number] EXAM: ABDOMEN - 1 VIEW COMPARISON:  None available FINDINGS: Gastrostomy tube projects over the left upper quadrant and stomach. Contrast noted within the stomach. No contrast extravasation. No free air or evidence of bowel obstruction. No organomegaly. IMPRESSION: Gastrostomy tube within the stomach. No evidence  of contrast extravasation. Electronically Signed   By: Franky Crease M.D.   On: 05/19/2023 18:14    IMPRESSION: No sign of penetration or aspiration.   Please refer to the Speech Pathologists report for complete details and recommendations.     Electronically Signed   By: Isla Blind M.D.   On: 12/19/2019 14:23  OV 07/21/2023  Subjective:  Patient ID: Guy Payne, male , DOB: 1950-05-30 , age 55 y.o. , MRN: [id number] , ADDRESS: [address] [place] [address] PCP Dayna Motto, DO Patient Care Team: Dayna Motto, DO as PCP - General (Family Medicine)  This Provider for this visit: Treatment Team:  Attending Provider: Geronimo Amel, MD    07/21/2023 -   Chief Complaint  Patient presents with   Follow-up    Dry Cough     HPI Guy Payne 73 y.o. -presents with his wife.  After I saw him in the office he ended up getting hospitalized early May 2025 with rhinovirus pneumonia and pulmonary infiltrates.  I did see him as inpatient at that time.  He is here for posthospital follow-up.  According to the wife he is doing really well.  They have been made a [place] trip near [address].  He had a good time.  Currently she is not complaining of cough although it is baseline symptom for him.  She is overall pleased with his progress.  No new complaints since her last time in the hospital.  Current baseline cough score is below.  He will need a chest x-ray for follow-up.  His chest x-ray at the time of admission showed significant infiltrates that I personally visualized again at this time.         MPRESSION: 1. No evidence of interstitial lung disease or air trapping. 2. Aortic atherosclerosis (ICD10-I70.0). Left anterior descending coronary artery calcification.     Electronically Signed   By: Newell Eke M.D.   On: 06/16/2023 08:39    OV 12/21/2023   Subjective:  Patient ID: Guy Payne, male , DOB: March 07, 1950, age 83 y.o. years. ,  MRN: [id number],  ADDRESS: [address] [place] [address] PCP  Dayna Motto, DO Providers : Treatment Team:  Attending Provider: Geronimo Amel, MD Patient Care Team: Dayna Motto, DO as PCP - General (Family Medicine)    Chief Complaint  Patient presents with   Medical Management of Chronic Issues   Cough    Cough has improved some. He had episode of aspiration approx 1 wk ago.        HPI Julie J Gauntt 73 y.o. - Carmichael Burdette Millirons is a 73 year old male who presents with recent respiratory distress and aspiration. He has ALS and wife is historian  He experienced a sudden onset of respiratory distress approximately one and a half hours  after being reported as doing well by his CNA. This episode required emergency medical services and resulted in hospitalization in the ICU for two days. During this time, he was treated with oxygen but did not require intubation. Following the ICU stay, he was transferred to a regular hospital floor for an additional four days. During his hospital stay, he experienced recurrent episodes of severe coughing fits, which necessitated the use of a vest to loosen secretions and frequent suctioning through his nose to clear his lungs. These episodes recurred each day, delaying his discharge. He was eventually discharged home on Tuesday, but experienced another coughing episode that night, which resolved with positional changes. At home, he has access to a suction machine, a nebulizer, and a cough assist machine, but not a vest. He receives all nutrition and fluids through a PEG tube as he does not swallow. He has a DNR order and has declined tracheostomy and intubation. Despite his medical challenges, he maintains a positive demeanor, engaging with hospital staff and not complaining about his condition.   All PO through PEG tube only       Objective:      12/21/2023    2:38 PM 07/21/2023    8:59 AM 07/21/2023    8:47 AM  Vitals with BMI   Height 5' 8  5' 8  Weight 126 lbs 124 lbs 124 lbs  BMI 19.16 18.86 18.86  Systolic 108 94   Diastolic 60 60   Pulse 69 74 74      General: No distress. Seems more emacitated O2 at rest: no Cane present: no Sitting in wheel chair: YES Frail: YES Obese: no Neuro: Alert and Oriented x 3. GCS 15. NO VOICE. TOO WEAK due to ALS Psych: Pleasant Resp:  Barrel Chest - no.  Wheeze - no, Crackles - no, No overt respiratory distress CVS: Normal heart sounds. Murmurs - no Ext: Stigmata of Connective Tissue Disease - no HEENT: Normal upper airway. PEERL +. No post nasal drip        Assessment/     Assessment   Cough ALS ASpiration Pneunonioa   Glad much better after hospitalization early MAy 2025 from Rhinovirus pneumonia and OCt 2025 from aspiration pneumonia    Plan - continue suppoprtive care    FOLLOWUP  .  -6 months  - 15 miniutes with aPP or Dr Geronimo ENGLAND    Dr. Dorethia Geronimo, M.D., F.C.C.P,  Pulmonary and Critical Care Medicine Staff Physician, [facility] Center Director - Interstitial Lung Disease  Program  Pulmonary Fibrosis [facility] [place], [place], [address]  Pager: [phone], If no answer or between  15:00h - 7:00h: call [phone] Telephone: [phone]  6:06 AM 12/18/2023

## 2023-12-21 NOTE — Patient Instructions (Addendum)
 ICD-10-CM   1. Chronic cough  R05.3     2. 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 DOE (dyspnea on exertion)  R06.09     4. Hospital discharge follow-up  Z09     5. History of acute respiratory failure  Z87.09         Glad much better after hospitalization early MAy 2025 from Rhinovirus pneumonia and OCt 2025 from aspiration pneumonia    Plan - continue suppoprtive care  Follow-up -6 months  - 15 miniutes with aPP or Dr Geronimo

## 2023-12-22 DIAGNOSIS — G1221 Amyotrophic lateral sclerosis: Secondary | ICD-10-CM | POA: Diagnosis not present

## 2023-12-22 DIAGNOSIS — R1312 Dysphagia, oropharyngeal phase: Secondary | ICD-10-CM | POA: Diagnosis not present

## 2023-12-22 DIAGNOSIS — F02818 Dementia in other diseases classified elsewhere, unspecified severity, with other behavioral disturbance: Secondary | ICD-10-CM | POA: Diagnosis not present

## 2023-12-22 DIAGNOSIS — J439 Emphysema, unspecified: Secondary | ICD-10-CM | POA: Diagnosis not present

## 2023-12-22 DIAGNOSIS — G4761 Periodic limb movement disorder: Secondary | ICD-10-CM | POA: Diagnosis not present

## 2023-12-22 DIAGNOSIS — G3109 Other frontotemporal dementia: Secondary | ICD-10-CM | POA: Diagnosis not present

## 2023-12-22 DIAGNOSIS — I10 Essential (primary) hypertension: Secondary | ICD-10-CM | POA: Diagnosis not present

## 2023-12-22 DIAGNOSIS — M47819 Spondylosis without myelopathy or radiculopathy, site unspecified: Secondary | ICD-10-CM | POA: Diagnosis not present

## 2023-12-22 DIAGNOSIS — E43 Unspecified severe protein-calorie malnutrition: Secondary | ICD-10-CM | POA: Diagnosis not present

## 2023-12-24 ENCOUNTER — Emergency Department (HOSPITAL_COMMUNITY)
Admission: EM | Admit: 2023-12-24 | Discharge: 2023-12-24 | Disposition: A | Discharge status: Home / Self Care | Attending: Emergency Medicine | Admitting: Emergency Medicine

## 2023-12-24 ENCOUNTER — Emergency Department (HOSPITAL_COMMUNITY)

## 2023-12-24 ENCOUNTER — Other Ambulatory Visit: Payer: Self-pay

## 2023-12-24 DIAGNOSIS — R059 Cough, unspecified: Secondary | ICD-10-CM | POA: Diagnosis not present

## 2023-12-24 DIAGNOSIS — R051 Acute cough: Secondary | ICD-10-CM | POA: Insufficient documentation

## 2023-12-24 DIAGNOSIS — R918 Other nonspecific abnormal finding of lung field: Secondary | ICD-10-CM | POA: Diagnosis not present

## 2023-12-24 LAB — BASIC METABOLIC PANEL WITH GFR
Anion gap: 9 (ref 5–15)
BUN: 26 mg/dL — ABNORMAL HIGH (ref 8–23)
CO2: 28 mmol/L (ref 22–32)
Calcium: 9.4 mg/dL (ref 8.9–10.3)
Chloride: 100 mmol/L (ref 98–111)
Creatinine, Ser: 0.62 mg/dL (ref 0.61–1.24)
GFR, Estimated: >60 mL/min (ref >60)
Glucose, Bld: 112 mg/dL — ABNORMAL HIGH (ref 70–99)
Potassium: 4.5 mmol/L (ref 3.5–5.1)
Sodium: 136 mmol/L (ref 135–145)

## 2023-12-24 LAB — CBC WITH DIFFERENTIAL/PLATELET
Abs Immature Granulocytes: 0.26 K/uL — ABNORMAL HIGH (ref 0.00–0.07)
Basophils Absolute: 0.1 K/uL (ref 0.0–0.1)
Basophils Relative: 1 %
Eosinophils Absolute: 0.2 K/uL (ref 0.0–0.5)
Eosinophils Relative: 2 %
HCT: 44.1 % (ref 39.0–52.0)
Hemoglobin: 13.9 g/dL (ref 13.0–17.0)
Immature Granulocytes: 3 %
Lymphocytes Relative: 10 %
Lymphs Abs: 0.8 K/uL (ref 0.7–4.0)
MCH: 30.2 pg (ref 26.0–34.0)
MCHC: 31.5 g/dL (ref 30.0–36.0)
MCV: 95.7 fL (ref 80.0–100.0)
Monocytes Absolute: 0.9 K/uL (ref 0.1–1.0)
Monocytes Relative: 10 %
Neutro Abs: 6.3 K/uL (ref 1.7–7.7)
Neutrophils Relative %: 74 %
Platelets: 393 K/uL (ref 150–400)
RBC: 4.61 MIL/uL (ref 4.22–5.81)
RDW: 12.7 % (ref 11.5–15.5)
WBC: 8.5 K/uL (ref 4.0–10.5)
nRBC: 0 % (ref 0.0–0.2)

## 2023-12-24 LAB — RESP PANEL BY RT-PCR (RSV, FLU A&B, COVID)  RVPGX2
Influenza A by PCR: NEGATIVE
Influenza B by PCR: NEGATIVE
Resp Syncytial Virus by PCR: NEGATIVE
SARS Coronavirus 2 by RT PCR: NEGATIVE

## 2023-12-24 NOTE — ED Provider Notes (Signed)
 [place] EMERGENCY DEPARTMENT AT [facility] Provider Note   CSN: [id number] Arrival date & time: 12/24/23  9692     History Chief Complaint  Patient presents with   Cough    HPI Guy Payne is a 73 y.o. male presenting for recurrent cough. Recently diagnosed with pneumonia after recurrent aspiration events in the setting of ALS. Cough improved as patient did finish antibiotics.  Seen at pulmonology earlier this week with clear lung sounds.  Cough returned tonight.  Family denies fevers chills nausea vomiting syncope shortness of breath.  Patient's recorded medical, surgical, social, medication list and allergies were reviewed in the Snapshot window as part of the initial history.   Review of Systems   Review of Systems  Constitutional:  Negative for chills and fever.  HENT:  Negative for ear pain and sore throat.   Eyes:  Negative for pain and visual disturbance.  Respiratory:  Positive for cough. Negative for shortness of breath.   Cardiovascular:  Negative for chest pain and palpitations.  Gastrointestinal:  Negative for abdominal pain and vomiting.  Genitourinary:  Negative for dysuria and hematuria.  Musculoskeletal:  Negative for arthralgias and back pain.  Skin:  Negative for color change and rash.  Neurological:  Negative for seizures and syncope.  All other systems reviewed and are negative.   Physical Exam Updated Vital Signs BP 127/84   Pulse 96   Temp 97.9 F (36.6 C) (Oral)   Resp 20   SpO2 93%  Physical Exam Vitals and nursing note reviewed.  Constitutional:      General: He is not in acute distress.    Appearance: He is well-developed.  HENT:     Head: Normocephalic and atraumatic.  Eyes:     Conjunctiva/sclera: Conjunctivae normal.  Cardiovascular:     Rate and Rhythm: Normal rate and regular rhythm.     Heart sounds: No murmur heard. Pulmonary:     Effort: Pulmonary effort is normal. No respiratory distress.     Breath sounds:  Normal breath sounds.  Abdominal:     Palpations: Abdomen is soft.     Tenderness: There is no abdominal tenderness.  Musculoskeletal:        General: No swelling.     Cervical back: Neck supple.  Skin:    General: Skin is warm and dry.     Capillary Refill: Capillary refill takes less than 2 seconds.  Neurological:     Mental Status: He is alert.  Psychiatric:        Mood and Affect: Mood normal.      ED Course/ Medical Decision Making/ A&P    Procedures Procedures   Medications Ordered in ED Medications - No data to display  Medical Decision Making:   Guy Payne is a 73 y.o. male who presented to the ED today with recurrent cough detailed above.    Additional history discussed with patient's family/caregivers.  Patient placed on continuous vitals and telemetry monitoring while in ED which was reviewed periodically.  Complete initial physical exam performed, notably the patient  was hemodynamically stable no acute distress.  Diffusely rhonchorous though not in respiratory distress.    Reviewed and confirmed nursing documentation for past medical history, family history, social history.    Initial Assessment:   With the patient's presentation of cough, most likely diagnosis is upper airway secretions, possible developing aspiration event again. Other diagnoses were considered including (but not limited to) underlying pneumonia, aspiration pneumonitis, heart failure exacerbation.  These are considered less likely due to history of present illness and physical exam findings.   This is most consistent with an acute life/limb threatening illness complicated by underlying chronic conditions.  Initial Plan:  Screening labs including CBC and Metabolic panel to evaluate for infectious or metabolic etiology of disease.  Urinalysis with reflex culture ordered to evaluate for UTI or relevant urologic/nephrologic pathology.  CXR to evaluate for structural/infectious intrathoracic  pathology.  EKG to evaluate for cardiac pathology Objective evaluation as below reviewed   Initial Study Results:   Laboratory  All laboratory results reviewed without evidence of clinically relevant pathology.    Reassessment and Plan:   Respiratory therapy team came to bedside.  Educated family on oral suctioning.  Patient grossly improved after this treatment.  Rhonchi improved.  No evidence of deep space pulmonary infection, white count normal, labs grossly reassuring and patient is comfortable at this time.  Substantial time at bedside with RT for education.  Patient stable for outpatient care and management.  Currently upper airway is the more likely etiology of patient's rhonchi rather than lower airway based on presentation.  Strict return precautions regarding development of fever or worsening symptoms reinforced and family expressed understanding.   Disposition:  I have considered need for hospitalization, however, considering all of the above, I believe this patient is stable for discharge at this time.  Patient/family educated about specific return precautions for given chief complaint and symptoms.  Patient/family educated about follow-up with PCP.      Patient/family expressed understanding of return precautions and need for follow-up. Patient spoken to regarding all imaging and laboratory results and appropriate follow up for these results. All education provided in verbal form with additional information in written form. Time was allowed for answering of patient questions. Patient discharged.    Emergency Department Medication Summary:   Medications - No data to display       Clinical Impression:  1. Acute cough      Discharge   Final Clinical Impression(s) / ED Diagnoses Final diagnoses:  Acute cough    Rx / DC Orders ED Discharge Orders     None         Jerral Meth, MD 12/24/23 [phone]

## 2023-12-24 NOTE — ED Triage Notes (Signed)
 Pt arrives with wife who reports ongoing cough over the last couple days. Pt was recently d/c'd for aspiration pneumonia. Pt has ALS and has lost ability to speak and is on feeding tube only.

## 2023-12-24 NOTE — Progress Notes (Signed)
 RT NT suctioned patient with a small amount of tanish white thick secretions. Patient tolerated well.

## 2023-12-25 DIAGNOSIS — F02818 Dementia in other diseases classified elsewhere, unspecified severity, with other behavioral disturbance: Secondary | ICD-10-CM | POA: Diagnosis not present

## 2023-12-25 DIAGNOSIS — G1221 Amyotrophic lateral sclerosis: Secondary | ICD-10-CM | POA: Diagnosis not present

## 2023-12-25 DIAGNOSIS — M47819 Spondylosis without myelopathy or radiculopathy, site unspecified: Secondary | ICD-10-CM | POA: Diagnosis not present

## 2023-12-25 DIAGNOSIS — E43 Unspecified severe protein-calorie malnutrition: Secondary | ICD-10-CM | POA: Diagnosis not present

## 2023-12-25 DIAGNOSIS — J439 Emphysema, unspecified: Secondary | ICD-10-CM | POA: Diagnosis not present

## 2023-12-25 DIAGNOSIS — R1312 Dysphagia, oropharyngeal phase: Secondary | ICD-10-CM | POA: Diagnosis not present

## 2023-12-25 DIAGNOSIS — G4761 Periodic limb movement disorder: Secondary | ICD-10-CM | POA: Diagnosis not present

## 2023-12-25 DIAGNOSIS — I10 Essential (primary) hypertension: Secondary | ICD-10-CM | POA: Diagnosis not present

## 2023-12-26 DIAGNOSIS — Z09 Encounter for follow-up examination after completed treatment for conditions other than malignant neoplasm: Secondary | ICD-10-CM | POA: Diagnosis not present

## 2023-12-26 DIAGNOSIS — R1312 Dysphagia, oropharyngeal phase: Secondary | ICD-10-CM | POA: Diagnosis not present

## 2023-12-26 DIAGNOSIS — R053 Chronic cough: Secondary | ICD-10-CM | POA: Diagnosis not present

## 2023-12-26 DIAGNOSIS — J188 Other pneumonia, unspecified organism: Secondary | ICD-10-CM | POA: Diagnosis not present

## 2023-12-26 DIAGNOSIS — G1221 Amyotrophic lateral sclerosis: Secondary | ICD-10-CM | POA: Diagnosis not present

## 2023-12-27 DIAGNOSIS — Z713 Dietary counseling and surveillance: Secondary | ICD-10-CM | POA: Diagnosis not present

## 2023-12-27 DIAGNOSIS — G1221 Amyotrophic lateral sclerosis: Secondary | ICD-10-CM | POA: Diagnosis not present

## 2023-12-27 DIAGNOSIS — Z789 Other specified health status: Secondary | ICD-10-CM | POA: Diagnosis not present

## 2023-12-27 DIAGNOSIS — R638 Other symptoms and signs concerning food and fluid intake: Secondary | ICD-10-CM | POA: Diagnosis not present

## 2023-12-27 DIAGNOSIS — M79604 Pain in right leg: Secondary | ICD-10-CM | POA: Diagnosis not present

## 2023-12-28 DIAGNOSIS — F02818 Dementia in other diseases classified elsewhere, unspecified severity, with other behavioral disturbance: Secondary | ICD-10-CM | POA: Diagnosis not present

## 2023-12-28 DIAGNOSIS — G1221 Amyotrophic lateral sclerosis: Secondary | ICD-10-CM | POA: Diagnosis not present

## 2023-12-28 DIAGNOSIS — E43 Unspecified severe protein-calorie malnutrition: Secondary | ICD-10-CM | POA: Diagnosis not present

## 2023-12-28 DIAGNOSIS — G4761 Periodic limb movement disorder: Secondary | ICD-10-CM | POA: Diagnosis not present

## 2023-12-28 DIAGNOSIS — G3109 Other frontotemporal dementia: Secondary | ICD-10-CM | POA: Diagnosis not present

## 2023-12-28 DIAGNOSIS — I10 Essential (primary) hypertension: Secondary | ICD-10-CM | POA: Diagnosis not present

## 2023-12-28 DIAGNOSIS — J439 Emphysema, unspecified: Secondary | ICD-10-CM | POA: Diagnosis not present

## 2023-12-28 DIAGNOSIS — M47819 Spondylosis without myelopathy or radiculopathy, site unspecified: Secondary | ICD-10-CM | POA: Diagnosis not present

## 2023-12-29 DIAGNOSIS — J439 Emphysema, unspecified: Secondary | ICD-10-CM | POA: Diagnosis not present

## 2023-12-29 DIAGNOSIS — I10 Essential (primary) hypertension: Secondary | ICD-10-CM | POA: Diagnosis not present

## 2023-12-29 DIAGNOSIS — G1221 Amyotrophic lateral sclerosis: Secondary | ICD-10-CM | POA: Diagnosis not present

## 2023-12-29 DIAGNOSIS — E43 Unspecified severe protein-calorie malnutrition: Secondary | ICD-10-CM | POA: Diagnosis not present

## 2023-12-29 DIAGNOSIS — M47819 Spondylosis without myelopathy or radiculopathy, site unspecified: Secondary | ICD-10-CM | POA: Diagnosis not present

## 2023-12-29 DIAGNOSIS — G3109 Other frontotemporal dementia: Secondary | ICD-10-CM | POA: Diagnosis not present

## 2023-12-29 DIAGNOSIS — F02818 Dementia in other diseases classified elsewhere, unspecified severity, with other behavioral disturbance: Secondary | ICD-10-CM | POA: Diagnosis not present

## 2023-12-29 DIAGNOSIS — R1312 Dysphagia, oropharyngeal phase: Secondary | ICD-10-CM | POA: Diagnosis not present

## 2023-12-29 DIAGNOSIS — G4761 Periodic limb movement disorder: Secondary | ICD-10-CM | POA: Diagnosis not present

## 2024-01-02 DIAGNOSIS — M47819 Spondylosis without myelopathy or radiculopathy, site unspecified: Secondary | ICD-10-CM | POA: Diagnosis not present

## 2024-01-04 DIAGNOSIS — R1312 Dysphagia, oropharyngeal phase: Secondary | ICD-10-CM | POA: Diagnosis not present

## 2024-01-04 DIAGNOSIS — M47819 Spondylosis without myelopathy or radiculopathy, site unspecified: Secondary | ICD-10-CM | POA: Diagnosis not present

## 2024-01-04 DIAGNOSIS — G4761 Periodic limb movement disorder: Secondary | ICD-10-CM | POA: Diagnosis not present

## 2024-01-04 DIAGNOSIS — F02818 Dementia in other diseases classified elsewhere, unspecified severity, with other behavioral disturbance: Secondary | ICD-10-CM | POA: Diagnosis not present

## 2024-01-04 DIAGNOSIS — E43 Unspecified severe protein-calorie malnutrition: Secondary | ICD-10-CM | POA: Diagnosis not present

## 2024-01-04 DIAGNOSIS — I10 Essential (primary) hypertension: Secondary | ICD-10-CM | POA: Diagnosis not present

## 2024-01-04 DIAGNOSIS — G1221 Amyotrophic lateral sclerosis: Secondary | ICD-10-CM | POA: Diagnosis not present

## 2024-01-04 DIAGNOSIS — G3109 Other frontotemporal dementia: Secondary | ICD-10-CM | POA: Diagnosis not present

## 2024-01-04 DIAGNOSIS — J439 Emphysema, unspecified: Secondary | ICD-10-CM | POA: Diagnosis not present

## 2024-01-05 DIAGNOSIS — G1221 Amyotrophic lateral sclerosis: Secondary | ICD-10-CM | POA: Diagnosis not present

## 2024-01-05 DIAGNOSIS — G4761 Periodic limb movement disorder: Secondary | ICD-10-CM | POA: Diagnosis not present

## 2024-01-05 DIAGNOSIS — R1312 Dysphagia, oropharyngeal phase: Secondary | ICD-10-CM | POA: Diagnosis not present

## 2024-01-05 DIAGNOSIS — M47819 Spondylosis without myelopathy or radiculopathy, site unspecified: Secondary | ICD-10-CM | POA: Diagnosis not present

## 2024-01-05 DIAGNOSIS — G3109 Other frontotemporal dementia: Secondary | ICD-10-CM | POA: Diagnosis not present

## 2024-01-05 DIAGNOSIS — I10 Essential (primary) hypertension: Secondary | ICD-10-CM | POA: Diagnosis not present

## 2024-01-05 DIAGNOSIS — E43 Unspecified severe protein-calorie malnutrition: Secondary | ICD-10-CM | POA: Diagnosis not present

## 2024-01-05 DIAGNOSIS — J439 Emphysema, unspecified: Secondary | ICD-10-CM | POA: Diagnosis not present

## 2024-01-05 DIAGNOSIS — F02818 Dementia in other diseases classified elsewhere, unspecified severity, with other behavioral disturbance: Secondary | ICD-10-CM | POA: Diagnosis not present

## 2024-01-08 DIAGNOSIS — I10 Essential (primary) hypertension: Secondary | ICD-10-CM | POA: Diagnosis not present

## 2024-01-08 DIAGNOSIS — F02818 Dementia in other diseases classified elsewhere, unspecified severity, with other behavioral disturbance: Secondary | ICD-10-CM | POA: Diagnosis not present

## 2024-01-08 DIAGNOSIS — E43 Unspecified severe protein-calorie malnutrition: Secondary | ICD-10-CM | POA: Diagnosis not present

## 2024-01-08 DIAGNOSIS — G1221 Amyotrophic lateral sclerosis: Secondary | ICD-10-CM | POA: Diagnosis not present

## 2024-01-08 DIAGNOSIS — J439 Emphysema, unspecified: Secondary | ICD-10-CM | POA: Diagnosis not present

## 2024-01-08 DIAGNOSIS — G4761 Periodic limb movement disorder: Secondary | ICD-10-CM | POA: Diagnosis not present

## 2024-01-08 DIAGNOSIS — R1312 Dysphagia, oropharyngeal phase: Secondary | ICD-10-CM | POA: Diagnosis not present

## 2024-01-08 DIAGNOSIS — G3109 Other frontotemporal dementia: Secondary | ICD-10-CM | POA: Diagnosis not present

## 2024-01-08 DIAGNOSIS — M47819 Spondylosis without myelopathy or radiculopathy, site unspecified: Secondary | ICD-10-CM | POA: Diagnosis not present

## 2024-01-10 DIAGNOSIS — F02818 Dementia in other diseases classified elsewhere, unspecified severity, with other behavioral disturbance: Secondary | ICD-10-CM | POA: Diagnosis not present

## 2024-01-10 DIAGNOSIS — G3109 Other frontotemporal dementia: Secondary | ICD-10-CM | POA: Diagnosis not present

## 2024-01-10 DIAGNOSIS — J439 Emphysema, unspecified: Secondary | ICD-10-CM | POA: Diagnosis not present

## 2024-01-10 DIAGNOSIS — G4761 Periodic limb movement disorder: Secondary | ICD-10-CM | POA: Diagnosis not present

## 2024-01-10 DIAGNOSIS — I10 Essential (primary) hypertension: Secondary | ICD-10-CM | POA: Diagnosis not present

## 2024-01-10 DIAGNOSIS — M47819 Spondylosis without myelopathy or radiculopathy, site unspecified: Secondary | ICD-10-CM | POA: Diagnosis not present

## 2024-01-10 DIAGNOSIS — E43 Unspecified severe protein-calorie malnutrition: Secondary | ICD-10-CM | POA: Diagnosis not present

## 2024-01-10 DIAGNOSIS — R1312 Dysphagia, oropharyngeal phase: Secondary | ICD-10-CM | POA: Diagnosis not present

## 2024-01-10 DIAGNOSIS — G1221 Amyotrophic lateral sclerosis: Secondary | ICD-10-CM | POA: Diagnosis not present

## 2024-01-11 DIAGNOSIS — G3109 Other frontotemporal dementia: Secondary | ICD-10-CM | POA: Diagnosis not present

## 2024-01-11 DIAGNOSIS — J439 Emphysema, unspecified: Secondary | ICD-10-CM | POA: Diagnosis not present

## 2024-01-11 DIAGNOSIS — R1312 Dysphagia, oropharyngeal phase: Secondary | ICD-10-CM | POA: Diagnosis not present

## 2024-01-11 DIAGNOSIS — M47819 Spondylosis without myelopathy or radiculopathy, site unspecified: Secondary | ICD-10-CM | POA: Diagnosis not present

## 2024-01-11 DIAGNOSIS — G4761 Periodic limb movement disorder: Secondary | ICD-10-CM | POA: Diagnosis not present

## 2024-01-11 DIAGNOSIS — F02818 Dementia in other diseases classified elsewhere, unspecified severity, with other behavioral disturbance: Secondary | ICD-10-CM | POA: Diagnosis not present

## 2024-01-11 DIAGNOSIS — G1221 Amyotrophic lateral sclerosis: Secondary | ICD-10-CM | POA: Diagnosis not present

## 2024-01-11 DIAGNOSIS — E43 Unspecified severe protein-calorie malnutrition: Secondary | ICD-10-CM | POA: Diagnosis not present

## 2024-01-11 DIAGNOSIS — I10 Essential (primary) hypertension: Secondary | ICD-10-CM | POA: Diagnosis not present

## 2024-01-27 DIAGNOSIS — G1221 Amyotrophic lateral sclerosis: Secondary | ICD-10-CM | POA: Diagnosis not present

## 2024-02-16 ENCOUNTER — Inpatient Hospital Stay (HOSPITAL_COMMUNITY): Admission: EM | Admit: 2024-02-16 | Discharge: 2024-02-22 | Disposition: E

## 2024-02-16 ENCOUNTER — Emergency Department (HOSPITAL_COMMUNITY)

## 2024-02-16 DIAGNOSIS — G1221 Amyotrophic lateral sclerosis: Secondary | ICD-10-CM | POA: Diagnosis present

## 2024-02-16 DIAGNOSIS — Z66 Do not resuscitate: Secondary | ICD-10-CM | POA: Diagnosis not present

## 2024-02-16 DIAGNOSIS — Z1152 Encounter for screening for COVID-19: Secondary | ICD-10-CM | POA: Diagnosis not present

## 2024-02-16 DIAGNOSIS — R0600 Dyspnea, unspecified: Secondary | ICD-10-CM | POA: Diagnosis present

## 2024-02-16 DIAGNOSIS — A419 Sepsis, unspecified organism: Secondary | ICD-10-CM | POA: Diagnosis present

## 2024-02-16 DIAGNOSIS — R6521 Severe sepsis with septic shock: Secondary | ICD-10-CM | POA: Diagnosis present

## 2024-02-16 DIAGNOSIS — R6520 Severe sepsis without septic shock: Secondary | ICD-10-CM | POA: Diagnosis present

## 2024-02-16 DIAGNOSIS — R230 Cyanosis: Secondary | ICD-10-CM | POA: Diagnosis present

## 2024-02-16 DIAGNOSIS — G3184 Mild cognitive impairment, so stated: Secondary | ICD-10-CM | POA: Diagnosis present

## 2024-02-16 DIAGNOSIS — Z931 Gastrostomy status: Secondary | ICD-10-CM | POA: Diagnosis not present

## 2024-02-16 DIAGNOSIS — E43 Unspecified severe protein-calorie malnutrition: Secondary | ICD-10-CM | POA: Diagnosis present

## 2024-02-16 DIAGNOSIS — G2581 Restless legs syndrome: Secondary | ICD-10-CM | POA: Diagnosis present

## 2024-02-16 DIAGNOSIS — J189 Pneumonia, unspecified organism: Secondary | ICD-10-CM | POA: Diagnosis present

## 2024-02-16 DIAGNOSIS — K3189 Other diseases of stomach and duodenum: Secondary | ICD-10-CM | POA: Diagnosis not present

## 2024-02-16 DIAGNOSIS — E871 Hypo-osmolality and hyponatremia: Secondary | ICD-10-CM | POA: Diagnosis not present

## 2024-02-16 DIAGNOSIS — Z515 Encounter for palliative care: Secondary | ICD-10-CM | POA: Diagnosis not present

## 2024-02-16 DIAGNOSIS — J690 Pneumonitis due to inhalation of food and vomit: Secondary | ICD-10-CM | POA: Diagnosis present

## 2024-02-16 DIAGNOSIS — R1310 Dysphagia, unspecified: Secondary | ICD-10-CM | POA: Diagnosis present

## 2024-02-16 DIAGNOSIS — I959 Hypotension, unspecified: Secondary | ICD-10-CM | POA: Diagnosis present

## 2024-02-16 DIAGNOSIS — T17908A Unspecified foreign body in respiratory tract, part unspecified causing other injury, initial encounter: Secondary | ICD-10-CM

## 2024-02-16 DIAGNOSIS — Z79899 Other long term (current) drug therapy: Secondary | ICD-10-CM | POA: Diagnosis not present

## 2024-02-16 DIAGNOSIS — J9601 Acute respiratory failure with hypoxia: Principal | ICD-10-CM | POA: Diagnosis present

## 2024-02-16 DIAGNOSIS — Z681 Body mass index (BMI) 19 or less, adult: Secondary | ICD-10-CM

## 2024-02-16 LAB — CBC WITH DIFFERENTIAL/PLATELET
Abs Immature Granulocytes: 0.06 K/uL (ref 0.00–0.07)
Basophils Absolute: 0 K/uL (ref 0.0–0.1)
Basophils Relative: 0 %
Eosinophils Absolute: 0 K/uL (ref 0.0–0.5)
Eosinophils Relative: 0 %
HCT: 52.8 % — ABNORMAL HIGH (ref 39.0–52.0)
Hemoglobin: 16.5 g/dL (ref 13.0–17.0)
Immature Granulocytes: 1 %
Lymphocytes Relative: 5 %
Lymphs Abs: 0.4 K/uL — ABNORMAL LOW (ref 0.7–4.0)
MCH: 31.1 pg (ref 26.0–34.0)
MCHC: 31.3 g/dL (ref 30.0–36.0)
MCV: 99.4 fL (ref 80.0–100.0)
Monocytes Absolute: 0.4 K/uL (ref 0.1–1.0)
Monocytes Relative: 5 %
Neutro Abs: 5.8 K/uL (ref 1.7–7.7)
Neutrophils Relative %: 89 %
Platelets: 204 K/uL (ref 150–400)
RBC: 5.31 MIL/uL (ref 4.22–5.81)
RDW: 13.7 % (ref 11.5–15.5)
WBC: 6.6 K/uL (ref 4.0–10.5)
nRBC: 0 % (ref 0.0–0.2)

## 2024-02-16 LAB — I-STAT CHEM 8, ED
BUN: 38 mg/dL — ABNORMAL HIGH (ref 8–23)
Calcium, Ion: 1.09 mmol/L — ABNORMAL LOW (ref 1.15–1.40)
Chloride: 102 mmol/L (ref 98–111)
Creatinine, Ser: 0.9 mg/dL (ref 0.61–1.24)
Glucose, Bld: 152 mg/dL — ABNORMAL HIGH (ref 70–99)
HCT: 55 % — ABNORMAL HIGH (ref 39.0–52.0)
Hemoglobin: 18.7 g/dL — ABNORMAL HIGH (ref 13.0–17.0)
Potassium: 5.3 mmol/L — ABNORMAL HIGH (ref 3.5–5.1)
Sodium: 141 mmol/L (ref 135–145)
TCO2: 26 mmol/L (ref 22–32)

## 2024-02-16 LAB — BLOOD GAS, ARTERIAL
Acid-base deficit: 0.1 mmol/L (ref 0.0–2.0)
Bicarbonate: 26.8 mmol/L (ref 20.0–28.0)
Drawn by: 23532
FIO2: 1 %
MECHVT: 420 mL
O2 Saturation: 99.4 %
PEEP: 5 cmH2O
Patient temperature: 36.2
RATE: 24 {breaths}/min
pCO2 arterial: 50 mmHg — ABNORMAL HIGH (ref 32–48)
pH, Arterial: 7.33 — ABNORMAL LOW (ref 7.35–7.45)
pO2, Arterial: 179 mmHg — ABNORMAL HIGH (ref 83–108)

## 2024-02-16 LAB — RESP PANEL BY RT-PCR (RSV, FLU A&B, COVID)  RVPGX2
Influenza A by PCR: NEGATIVE
Influenza B by PCR: NEGATIVE
Resp Syncytial Virus by PCR: NEGATIVE
SARS Coronavirus 2 by RT PCR: NEGATIVE

## 2024-02-16 LAB — COMPREHENSIVE METABOLIC PANEL WITH GFR
ALT: 41 U/L (ref 0–44)
AST: 47 U/L — ABNORMAL HIGH (ref 15–41)
Albumin: 3.3 g/dL — ABNORMAL LOW (ref 3.5–5.0)
Alkaline Phosphatase: 108 U/L (ref 38–126)
Anion gap: 12 (ref 5–15)
BUN: 28 mg/dL — ABNORMAL HIGH (ref 8–23)
CO2: 24 mmol/L (ref 22–32)
Calcium: 8.4 mg/dL — ABNORMAL LOW (ref 8.9–10.3)
Chloride: 109 mmol/L (ref 98–111)
Creatinine, Ser: 0.72 mg/dL (ref 0.61–1.24)
GFR, Estimated: >60 mL/min
Glucose, Bld: 88 mg/dL (ref 70–99)
Potassium: 4.3 mmol/L (ref 3.5–5.1)
Sodium: 144 mmol/L (ref 135–145)
Total Bilirubin: 0.6 mg/dL (ref 0.0–1.2)
Total Protein: 6.9 g/dL (ref 6.5–8.1)

## 2024-02-16 LAB — BLOOD GAS, VENOUS
Acid-base deficit: 5.3 mmol/L — ABNORMAL HIGH (ref 0.0–2.0)
Bicarbonate: 24.4 mmol/L (ref 20.0–28.0)
O2 Saturation: 58.2 %
Patient temperature: 37
pCO2, Ven: 67 mmHg — ABNORMAL HIGH (ref 44–60)
pH, Ven: 7.17 — CL (ref 7.25–7.43)
pO2, Ven: 37 mmHg (ref 32–45)

## 2024-02-16 LAB — PROTIME-INR
INR: 1.2 (ref 0.8–1.2)
Prothrombin Time: 15.6 s — ABNORMAL HIGH (ref 11.4–15.2)

## 2024-02-16 LAB — I-STAT CG4 LACTIC ACID, ED: Lactic Acid, Venous: 5.2 mmol/L (ref 0.5–1.9)

## 2024-02-16 LAB — CBG MONITORING, ED: Glucose-Capillary: 131 mg/dL — ABNORMAL HIGH (ref 70–99)

## 2024-02-16 MED ORDER — CHLORHEXIDINE GLUCONATE CLOTH 2 % EX PADS
6.0000 | MEDICATED_PAD | Freq: Every day | CUTANEOUS | Status: DC
  Administered 2024-02-16 – 2024-02-18 (×3): 6 via TOPICAL

## 2024-02-16 MED ORDER — LACTATED RINGERS IV BOLUS (SEPSIS)
250.0000 mL | Freq: Once | INTRAVENOUS | Status: AC
  Administered 2024-02-16: 250 mL via INTRAVENOUS

## 2024-02-16 MED ORDER — SODIUM CHLORIDE 0.9 % IV SOLN
2.0000 g | Freq: Once | INTRAVENOUS | Status: AC
  Administered 2024-02-16: 2 g via INTRAVENOUS
  Filled 2024-02-16: qty 20

## 2024-02-16 MED ORDER — HEPARIN SODIUM (PORCINE) 5000 UNIT/ML IJ SOLN
5000.0000 [IU] | Freq: Three times a day (TID) | INTRAMUSCULAR | Status: DC
  Administered 2024-02-17 – 2024-02-18 (×7): 5000 [IU] via SUBCUTANEOUS
  Filled 2024-02-16 (×8): qty 1

## 2024-02-16 MED ORDER — LACTATED RINGERS IV BOLUS (SEPSIS)
500.0000 mL | Freq: Once | INTRAVENOUS | Status: AC
  Administered 2024-02-16: 500 mL via INTRAVENOUS

## 2024-02-16 MED ORDER — SODIUM CHLORIDE 0.9 % IV SOLN
500.0000 mg | Freq: Once | INTRAVENOUS | Status: AC
  Administered 2024-02-16: 500 mg via INTRAVENOUS
  Filled 2024-02-16: qty 5

## 2024-02-16 MED ORDER — ROCURONIUM BROMIDE 10 MG/ML (PF) SYRINGE
PREFILLED_SYRINGE | INTRAVENOUS | Status: AC | PRN
  Administered 2024-02-16: 100 mg via INTRAVENOUS

## 2024-02-16 MED ORDER — INSULIN ASPART 100 UNIT/ML IJ SOLN
0.0000 [IU] | INTRAMUSCULAR | Status: DC
  Administered 2024-02-17 (×2): 2 [IU] via SUBCUTANEOUS
  Filled 2024-02-16: qty 2

## 2024-02-16 MED ORDER — FENTANYL 2500MCG IN NS 250ML (10MCG/ML) PREMIX INFUSION
INTRAVENOUS | Status: AC
  Administered 2024-02-16: 25 ug/h via INTRAVENOUS
  Filled 2024-02-16: qty 250

## 2024-02-16 MED ORDER — ETOMIDATE 2 MG/ML IV SOLN
INTRAVENOUS | Status: AC | PRN
  Administered 2024-02-16: 20 mg via INTRAVENOUS

## 2024-02-16 MED ORDER — DEXMEDETOMIDINE HCL IN NACL 400 MCG/100ML IV SOLN
INTRAVENOUS | Status: AC
  Filled 2024-02-16: qty 100

## 2024-02-16 MED ORDER — ONDANSETRON HCL 4 MG/2ML IJ SOLN: 4.0000 mg | Freq: Four times a day (QID) | INTRAMUSCULAR | Status: DC | PRN

## 2024-02-16 MED ORDER — FAMOTIDINE 20 MG PO TABS
20.0000 mg | ORAL_TABLET | Freq: Two times a day (BID) | ORAL | Status: DC
  Administered 2024-02-17 – 2024-02-18 (×3): 20 mg
  Filled 2024-02-16 (×3): qty 1

## 2024-02-16 MED ORDER — LACTATED RINGERS IV SOLN: INTRAVENOUS | Status: DC

## 2024-02-16 MED ORDER — VANCOMYCIN HCL IN DEXTROSE 1-5 GM/200ML-% IV SOLN
1000.0000 mg | Freq: Once | INTRAVENOUS | Status: AC
  Administered 2024-02-16: 1000 mg via INTRAVENOUS
  Filled 2024-02-16: qty 200

## 2024-02-16 MED ORDER — EPINEPHRINE 1 MG/10ML IV SOSY
PREFILLED_SYRINGE | INTRAVENOUS | Status: AC | PRN
  Administered 2024-02-16: 1 mg via INTRAVENOUS

## 2024-02-16 MED ORDER — LACTATED RINGERS IV BOLUS (SEPSIS)
1000.0000 mL | Freq: Once | INTRAVENOUS | Status: AC
  Administered 2024-02-16: 1000 mL via INTRAVENOUS

## 2024-02-16 MED ORDER — PIPERACILLIN-TAZOBACTAM 3.375 G IVPB 30 MIN
3.3750 g | Freq: Once | INTRAVENOUS | Status: AC
  Administered 2024-02-17: 3.375 g via INTRAVENOUS
  Filled 2024-02-16: qty 50

## 2024-02-16 NOTE — Progress Notes (Signed)
 Elink monitoring for the code sepsis protocol.

## 2024-02-16 NOTE — Progress Notes (Signed)
 Pharmacy Antibiotic Note  Guy Payne is a 73 y.o. male admitted on 02/16/2024 with aspiration pna.  Pharmacy has been consulted for vancomycin  dosing.  Plan: Vancomycin  1gm IV x 1 then 1250mg  IV q24h (AUC 516.2, Scr used 0.8, TBW) Follow renal function and clinical course  Weight: 57.2 kg (126 lb 1.7 oz)  Temp (24hrs), Avg:97 F (36.1 C), Min:97 F (36.1 C), Max:97 F (36.1 C)  Recent Labs  Lab 02/16/24 1921 02/16/24 1922 02/16/24 1940 02/16/24 2041  WBC  --   --  6.6  --   CREATININE 0.90  --   --  0.72  LATICACIDVEN  --  5.2*  --   --     Estimated Creatinine Clearance: 65.5 mL/min (by C-G formula based on SCr of 0.72 mg/dL).    Allergies[1]  Antimicrobials this admission: 12/26 CTx x 1 12/26 azith x 1 12/26 vanc >> 12/26 zosyn  >>  Dose adjustments this admission:   Microbiology results: 12/26 BCx: 12/26 Sputum:   12/26 MRSA PCR:   Thank you for allowing pharmacy to be a part of this patients care.  Leeroy Mace RPh 02/16/2024, 9:59 PM      [1] No Known Allergies

## 2024-02-16 NOTE — ED Provider Notes (Signed)
 " [place] EMERGENCY DEPARTMENT AT [facility] Provider Note   CSN: [id number] Arrival date & time: 02/16/24  1914     Patient presents with: Loss of Consciousness   Guy Payne is a 73 y.o. male.   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  Brought in today by his wife by private vehicle after he was having difficulty with his breathing.  Wife is concerned the patient may have aspirated as there is another person's during his feedings today.  He stated he was in his usual state of health over the last few days.   Loss of Consciousness      Prior to Admission medications  Medication Sig Start Date End Date Taking? Authorizing Provider  acetaminophen  (TYLENOL ) 500 MG tablet Take 500-1,000 mg by mouth every 6 (six) hours as needed for moderate pain (pain score 4-6). Patient not taking: Reported on 12/21/2023    [provider]  donepezil  ([name] ) 10 MG tablet TAKE 1 TABLET BY MOUTH EVERYDAY AT BEDTIME 11/14/19   Ines Onetha NOVAK, MD  famotidine  (PEPCID ) 40 MG/5ML suspension 20 mg by Enteral route daily. 11/28/23   [provider]  glycopyrrolate  (ROBINUL ) 1 MG tablet Take 1-2 mg by mouth See admin instructions. Take 1mg  (1 tablet) by mouth in the mornings and 2mg  (2 tablets) in the evening.    [provider]  guaifenesin  (ROBITUSSIN) 100 MG/5ML syrup Take 200 mg by mouth 3 (three) times daily as needed for cough. Patient not taking: Reported on 12/21/2023    [provider]  ipratropium-albuterol  (DUONEB) 0.5-2.5 (3) MG/3ML SOLN INHALE 3 ML BY NEBULIZER EVERY 6 HOURS AS NEEDED 09/25/23   Geronimo Amel, MD  loratadine (CLARITIN) 10 MG tablet Take 10 mg by mouth daily as needed for allergies. Patient not taking: Reported on 12/21/2023    [provider]  nystatin (MYCOSTATIN) 100000 UNIT/ML suspension Take 16 mLs by mouth 4 (four) times daily. 12/12/23   [provider]  tiZANidine (ZANAFLEX) 4 MG  tablet 4 mg by Enteral route every 6 (six) hours as needed. 12/08/23   [provider]    Allergies: Patient has no known allergies.    Review of Systems  Cardiovascular:  Positive for syncope.    Updated Vital Signs BP (!) 162/84   Pulse (!) 109   Temp (!) 97 F (36.1 C)   Resp (!) 24   SpO2 100%   Physical Exam Vitals and nursing note reviewed.  Constitutional:      General: He is in acute distress.     Appearance: He is ill-appearing.  HENT:     Head: Normocephalic and atraumatic.  Cardiovascular:     Rate and Rhythm: Tachycardia present.  Pulmonary:     Effort: Respiratory distress present.  Abdominal:     General: Abdomen is flat.  Musculoskeletal:     Cervical back: Rigidity present.  Neurological:     Mental Status: He is disoriented.     (all labs ordered are listed, but only abnormal results are displayed) Labs Reviewed  BLOOD GAS, VENOUS - Abnormal; Notable for the following components:      Result Value   pH, Ven 7.17 (*)    pCO2, Ven 67 (*)    Acid-base deficit 5.3 (*)    All other components within normal limits  CBC WITH DIFFERENTIAL/PLATELET - Abnormal; Notable for the following components:   HCT 52.8 (*)    Lymphs Abs 0.4 (*)  All other components within normal limits  PROTIME-INR - Abnormal; Notable for the following components:   Prothrombin Time 15.6 (*)    All other components within normal limits  CBG MONITORING, ED - Abnormal; Notable for the following components:   Glucose-Capillary 131 (*)    All other components within normal limits  I-STAT CHEM 8, ED - Abnormal; Notable for the following components:   Potassium 5.3 (*)    BUN 38 (*)    Glucose, Bld 152 (*)    Calcium, Ion 1.09 (*)    Hemoglobin 18.7 (*)    HCT 55.0 (*)    All other components within normal limits  I-STAT CG4 LACTIC ACID, ED - Abnormal; Notable for the following components:   Lactic Acid, Venous 5.2 (*)    All other components within normal limits   RESP PANEL BY RT-PCR (RSV, FLU A&B, COVID)  RVPGX2  CULTURE, BLOOD (ROUTINE X 2)  CULTURE, BLOOD (ROUTINE X 2)  URINALYSIS, W/ REFLEX TO CULTURE (INFECTION SUSPECTED)  BLOOD GAS, ARTERIAL  COMPREHENSIVE METABOLIC PANEL WITH GFR  I-STAT CG4 LACTIC ACID, ED    EKG: None  Radiology: No results found.   Date/Time: 02/16/2024 8:21 PM  Performed by: Mannie Fairy DASEN, DOPre-anesthesia Checklist: Emergency Drugs available, Suction available, Timeout performed, Patient being monitored and Patient identified Oxygen Delivery Method: Simple face mask Preoxygenation: Pre-oxygenation with 100% oxygen Induction Type: Rapid sequence Ventilation: Mask ventilation with difficulty Laryngoscope Size: Glidescope and 4 Grade View: Grade I Tube size: 7.5 mm Number of attempts: 2 Placement Confirmation: ETT inserted through vocal cords under direct vision, Positive ETCO2, CO2 detector and Breath sounds checked- equal and bilateral Secured at: 23 cm Dental Injury: Teeth and Oropharynx as per pre-operative assessment  Difficulty Due To: Difficult Airway- due to reduced neck mobility and Difficult Airway- due to anterior larynx Comments: Applied cricoid pressure for second attempt with easy passage.    .Critical Care  Performed by: Mannie Fairy DASEN, DO Authorized by: Mannie Fairy DASEN, DO   Critical care provider statement:    Critical care time (minutes):  81   Critical care was necessary to treat or prevent imminent or life-threatening deterioration of the following conditions:  Sepsis and respiratory failure   Critical care was time spent personally by me on the following activities:  Development of treatment plan with patient or surrogate, discussions with consultants, evaluation of patient's response to treatment, examination of patient, ordering and review of laboratory studies, ordering and review of radiographic studies, ordering and performing treatments and interventions, pulse oximetry,  re-evaluation of patient's condition and review of old charts    Medications Ordered in the ED  lactated ringers  infusion (has no administration in time range)  cefTRIAXone  (ROCEPHIN ) 2 g in sodium chloride  0.9 % 100 mL IVPB (has no administration in time range)  azithromycin  (ZITHROMAX ) 500 mg in sodium chloride  0.9 % 250 mL IVPB (has no administration in time range)  lactated ringers  bolus 1,000 mL (1,000 mLs Intravenous New Bag/Given 02/16/24 2018)    And  lactated ringers  bolus 500 mL (has no administration in time range)    And  lactated ringers  bolus 250 mL (has no administration in time range)  etomidate  (AMIDATE ) injection (20 mg Intravenous Given 02/16/24 1924)  rocuronium  (ZEMURON ) injection (100 mg Intravenous Given 02/16/24 1927)  EPINEPHrine  (ADRENALIN ) 1 MG/10ML injection (1 mg Intravenous Given 02/16/24 1934)  dexmedetomidine  (PRECEDEX ) 400 MCG/100ML (4 mcg/mL) infusion (  New Bag/Given 02/16/24 1944)  fentaNYL  10 mcg/ml infusion (25  mcg/hr  New Bag/Given 02/16/24 1942)                                    Medical Decision Making 73 year old male here for respiratory distress.  Plan-I exited the doc box and saw patient being wheeled down her hallway on stretcher by triage staff bagging the patient.  Patient was brought to resuscitation bay.  Patient continued to be bagged while I obtain the story from the patient.  Hearing that he was in ALS patient with advanced disease, quickly got the patient's wife back and had a goals of care conversation with her as well as family on the telephone.  I explained how in the patient's current condition, we would have to make a decision to either intubate the patient or proceed with comfort measures only.  [name] told me that she knew the patient would not want a tracheostomy, and I explained that with his ALS, the odds of him being successfully extubated without tracheostomy was extremely low.  During this conversation, we are able  to maintain O2 saturations on the patient greater than 90%.  He still however remained cyanotic.  We established IV access, drew labs.  After lengthy discussion with family, wife did decide to the patient should be intubated.  She is hopeful that the patient will be able to return to his baseline prior to this episode.  She understands that his prognosis for that is poor.  Will start the patient on broad-spectrum antibiotics for presumed aspiration.  Following intubation, patient had 2 brief periods of hypotension which corrected with push dose epinephrine , fluid bolus.  He was started on fentanyl  and Precedex .  Admitted to intensivist Dr. Loreli.  This patient may have sepsis.  Reassessment 8:20 PM-I re-evaluated the patient following his 30 cc/kg IV fluid bolus.   Amount and/or Complexity of Data Reviewed Labs: ordered. Radiology: ordered.  Risk Prescription drug management.        Final diagnoses:  Acute hypoxic respiratory failure (HCC)  ALS (amyotrophic lateral sclerosis) (HCC)  Aspiration into airway, initial encounter  Severe sepsis [facility])    ED Discharge Orders     None          Mannie Fairy DASEN, DO 02/16/24 2024  "

## 2024-02-16 NOTE — ED Triage Notes (Signed)
 Pt arrives POV with wife, unresponsive with agonal respirations. Pt pulled out of car by nursing staff, BVM started immediately.  Hx of ALS

## 2024-02-16 NOTE — H&P (Signed)
 "  NAME:  Guy Payne, MRN:  [id number], DOB:  Aug 24, 1950, LOS: 0 ADMISSION DATE:  02/16/2024, CONSULTATION DATE:  02/16/24 REFERRING MD:  Fairy Gravely, MD, CHIEF COMPLAINT:  Aspiration event   History of Present Illness:  73 y/o male with PMH for ALS x 4.5 years, has NIV but does not use it according to Neurology note, but per wife at beside he has no O2 and no NIV/PAP.  He is PEG tube fed only, no oral feeds/pleasure feeds.  He has multiple aspiration events in the past and he was in the ER in November after an aspiration event/pneumonia.   This time patient BIB wife and was unresponsive with agonal feeds.  Nursing staff pulled patient out of the car and BVM started.  Wife's concern for aspiration. LA 5.2, VBG PH 7.17, cxr unofficially, bilateral infiltrates and lasrge gastric /colonic gas.  Pertinent  Medical History  ALS  Significant Hospital Events: Including procedures, antibiotic start and stop dates in addition to other pertinent events   12/26: admit to ICU  Interim History / Subjective:  N/A  Objective    Blood pressure (!) 162/84, pulse (!) 109, temperature (!) 97 F (36.1 C), resp. rate (!) 24, SpO2 100%.    Vent Mode: PRVC FiO2 (%):  [100 %] 100 % Set Rate:  [24 bmp] 24 bmp Vt Set:  [420 mL] 420 mL PEEP:  [5 cmH20] 5 cmH20 Plateau Pressure:  [17 cmH20] 17 cmH20   Intake/Output Summary (Last 24 hours) at 02/16/2024 2121 Last data filed at 02/16/2024 2103 Gross per 24 hour  Intake 1100 ml  Output --  Net 1100 ml   There were no vitals filed for this visit.  Examination: General: sedated and intubated, NAD HENT: pupils reactive, no icterus, supple, no JVD no LN Lungs: CTA no wheezes no rales Cardiovascular: reg s1s2 no murmurs Abdomen: soft nt nd bs pos, peg pos Extremities: no edema, cold feet, PP+ Neuro: sedated on vent/sedated GU: Foley  Resolved problem list   Assessment and Plan  Acute hypoxic respiratory failure Vent management SAT/SBT when  medically appropriate Follow O2 sats, ABG/VBGs Acute Aspiration Pneumonia Broad spectrum antibiotics Tracheal aspirates for culture Sepsis Lactic acid should improve with IV fluids and Antibiotics Antibiotics IV fluids Supportive care ALS Supportive care Nutrition with PEG tube feels Family does not want trach  Family and wife at bedside Labs   CBC: Recent Labs  Lab 02/16/24 1921 02/16/24 1940  WBC  --  6.6  NEUTROABS  --  5.8  HGB 18.7* 16.5  HCT 55.0* 52.8*  MCV  --  99.4  PLT  --  204    Basic Metabolic Panel: Recent Labs  Lab 02/16/24 1921  NA 141  K 5.3*  CL 102  GLUCOSE 152*  BUN 38*  CREATININE 0.90   GFR: CrCl cannot be calculated (Unknown ideal weight.). Recent Labs  Lab 02/16/24 1922 02/16/24 1940  WBC  --  6.6  LATICACIDVEN 5.2*  --     Liver Function Tests: No results for input(s): AST, ALT, ALKPHOS, BILITOT, PROT, ALBUMIN  in the last 168 hours. No results for input(s): LIPASE, AMYLASE in the last 168 hours. No results for input(s): AMMONIA in the last 168 hours.  ABG    Component Value Date/Time   HCO3 24.4 02/16/2024 1919   TCO2 26 02/16/2024 1921   ACIDBASEDEF 5.3 (H) 02/16/2024 1919   O2SAT 58.2 02/16/2024 1919     Coagulation Profile: Recent Labs  Lab 02/16/24 1940  INR 1.2    Cardiac Enzymes: No results for input(s): CKTOTAL, CKMB, CKMBINDEX, TROPONINI in the last 168 hours.  HbA1C: No results found for: HGBA1C  CBG: Recent Labs  Lab 02/16/24 1920  GLUCAP 131*    Review of Systems:   Intubated and sedated  Past Medical History:  He,  has a past medical history of ALS (amyotrophic lateral sclerosis) (HCC), Fall (2021), MCI (mild cognitive impairment), Memory loss, Prostate disorder, and Restless leg.   Surgical History:   Past Surgical History:  Procedure Laterality Date   genital surgery     was not specific   NO PAST SURGERIES     prostate repair for blockage        Social History:   reports that he has never smoked. He has never used smokeless tobacco. He reports that he does not currently use alcohol. He reports that he does not use drugs.   Family History:  His family history is negative for Dementia.   Allergies Allergies[1]   Home Medications  Prior to Admission medications  Medication Sig Start Date End Date Taking? Authorizing Provider  donepezil  ([name] ) 10 MG tablet TAKE 1 TABLET BY MOUTH EVERYDAY AT BEDTIME Patient taking differently: Place 10 mg into feeding tube at bedtime. 11/14/19  Yes Ines Onetha NOVAK, MD  famotidine  (PEPCID ) 40 MG/5ML suspension 20 mg by Enteral route 2 (two) times daily as needed for heartburn or indigestion. 11/28/23  Yes [provider]  ipratropium-albuterol  (DUONEB) 0.5-2.5 (3) MG/3ML SOLN INHALE 3 ML BY NEBULIZER EVERY 6 HOURS AS NEEDED Patient taking differently: Take 3 mLs by nebulization 2 (two) times daily. 09/25/23  Yes Geronimo Amel, MD  nystatin (MYCOSTATIN) 100000 UNIT/ML suspension Use as directed 4 mLs in the mouth or throat 4 (four) times daily. 12/12/23  Yes [provider]  tiZANidine (ZANAFLEX) 4 MG tablet 4 mg by Enteral route 2 (two) times daily as needed for muscle spasms. 12/08/23  Yes [provider]     Critical care time: 60   The patient is critically ill with multiple organ system failure and requires high complexity decision making for assessment and support, frequent evaluation and titration of therapies, advanced monitoring, review of radiographic studies and interpretation of complex data.   Critical Care Time devoted to patient care services, exclusive of separately billable procedures, described in this note is 32 minutes.   Orlin Fairly, MD [facility] See Amion for pager  If no response to pager , please call [phone] until 7pm After 7:00 pm call Elink  [phone] 02/16/2024, 9:22 PM            [1] No Known  Allergies  "

## 2024-02-16 NOTE — ED Notes (Signed)
 Unable to collect second set of blood culture due to pt being difficult stick. One set collected and sent off.

## 2024-02-17 ENCOUNTER — Other Ambulatory Visit: Payer: Self-pay

## 2024-02-17 ENCOUNTER — Inpatient Hospital Stay (HOSPITAL_COMMUNITY)

## 2024-02-17 ENCOUNTER — Encounter (HOSPITAL_COMMUNITY): Payer: Self-pay

## 2024-02-17 DIAGNOSIS — E871 Hypo-osmolality and hyponatremia: Secondary | ICD-10-CM | POA: Diagnosis not present

## 2024-02-17 DIAGNOSIS — J690 Pneumonitis due to inhalation of food and vomit: Secondary | ICD-10-CM | POA: Diagnosis not present

## 2024-02-17 DIAGNOSIS — K3189 Other diseases of stomach and duodenum: Secondary | ICD-10-CM | POA: Diagnosis not present

## 2024-02-17 DIAGNOSIS — J9601 Acute respiratory failure with hypoxia: Secondary | ICD-10-CM | POA: Diagnosis not present

## 2024-02-17 DIAGNOSIS — G1221 Amyotrophic lateral sclerosis: Secondary | ICD-10-CM | POA: Diagnosis not present

## 2024-02-17 DIAGNOSIS — Z931 Gastrostomy status: Secondary | ICD-10-CM | POA: Diagnosis not present

## 2024-02-17 DIAGNOSIS — A419 Sepsis, unspecified organism: Secondary | ICD-10-CM | POA: Diagnosis not present

## 2024-02-17 LAB — URINALYSIS, W/ REFLEX TO CULTURE (INFECTION SUSPECTED)
Bilirubin Urine: NEGATIVE
Glucose, UA: NEGATIVE mg/dL
Hgb urine dipstick: NEGATIVE
Ketones, ur: NEGATIVE mg/dL
Leukocytes,Ua: NEGATIVE
Nitrite: NEGATIVE
Protein, ur: NEGATIVE mg/dL
Specific Gravity, Urine: 1.014 (ref 1.005–1.030)
pH: 5 (ref 5.0–8.0)

## 2024-02-17 LAB — CBC
HCT: 42.6 % (ref 39.0–52.0)
HCT: 47 % (ref 39.0–52.0)
Hemoglobin: 13.4 g/dL (ref 13.0–17.0)
Hemoglobin: 14.7 g/dL (ref 13.0–17.0)
MCH: 32.1 pg (ref 26.0–34.0)
MCH: 31.3 pg (ref 26.0–34.0)
MCHC: 31.5 g/dL (ref 30.0–36.0)
MCHC: 31.3 g/dL (ref 30.0–36.0)
MCV: 102.2 fL — ABNORMAL HIGH (ref 80.0–100.0)
MCV: 100.2 fL — ABNORMAL HIGH (ref 80.0–100.0)
Platelets: 120 K/uL — ABNORMAL LOW (ref 150–400)
Platelets: 139 K/uL — ABNORMAL LOW (ref 150–400)
RBC: 4.17 MIL/uL — ABNORMAL LOW (ref 4.22–5.81)
RBC: 4.69 MIL/uL (ref 4.22–5.81)
RDW: 13.6 % (ref 11.5–15.5)
RDW: 13.6 % (ref 11.5–15.5)
WBC: 12.5 K/uL — ABNORMAL HIGH (ref 4.0–10.5)
WBC: 14.8 K/uL — ABNORMAL HIGH (ref 4.0–10.5)
nRBC: 0 % (ref 0.0–0.2)
nRBC: 0 % (ref 0.0–0.2)

## 2024-02-17 LAB — BASIC METABOLIC PANEL WITH GFR
Anion gap: 20 — ABNORMAL HIGH (ref 5–15)
BUN: 21 mg/dL (ref 8–23)
CO2: 12 mmol/L — ABNORMAL LOW (ref 22–32)
Calcium: 8.1 mg/dL — ABNORMAL LOW (ref 8.9–10.3)
Chloride: 101 mmol/L (ref 98–111)
Creatinine, Ser: 0.54 mg/dL — ABNORMAL LOW (ref 0.61–1.24)
GFR, Estimated: >60 mL/min
Glucose, Bld: 163 mg/dL — ABNORMAL HIGH (ref 70–99)
Potassium: 4.7 mmol/L (ref 3.5–5.1)
Sodium: 133 mmol/L — ABNORMAL LOW (ref 135–145)

## 2024-02-17 LAB — GLUCOSE, CAPILLARY
Glucose-Capillary: 118 mg/dL — ABNORMAL HIGH (ref 70–99)
Glucose-Capillary: 121 mg/dL — ABNORMAL HIGH (ref 70–99)
Glucose-Capillary: 141 mg/dL — ABNORMAL HIGH (ref 70–99)
Glucose-Capillary: 75 mg/dL (ref 70–99)
Glucose-Capillary: 78 mg/dL (ref 70–99)
Glucose-Capillary: 78 mg/dL (ref 70–99)

## 2024-02-17 LAB — BLOOD GAS, ARTERIAL
Acid-Base Excess: 0.1 mmol/L (ref 0.0–2.0)
Bicarbonate: 26 mmol/L (ref 20.0–28.0)
Drawn by: 74501
FIO2: 40 %
MECHVT: 420 mL
O2 Saturation: 97.4 %
PEEP: 5 cmH2O
Patient temperature: 37.5
RATE: 24 {breaths}/min
pCO2 arterial: 47 mmHg (ref 32–48)
pH, Arterial: 7.35 (ref 7.35–7.45)
pO2, Arterial: 67 mmHg — ABNORMAL LOW (ref 83–108)

## 2024-02-17 LAB — LACTIC ACID, PLASMA
Lactic Acid, Venous: >9 mmol/L (ref 0.5–1.9)
Lactic Acid, Venous: 8 mmol/L (ref 0.5–1.9)
Lactic Acid, Venous: 4.9 mmol/L (ref 0.5–1.9)
Lactic Acid, Venous: 6.7 mmol/L (ref 0.5–1.9)

## 2024-02-17 LAB — HEMOGLOBIN A1C
Hgb A1c MFr Bld: 5.6 % (ref 4.8–5.6)
Mean Plasma Glucose: 114.02 mg/dL

## 2024-02-17 LAB — CREATININE, SERUM
Creatinine, Ser: 0.49 mg/dL — ABNORMAL LOW (ref 0.61–1.24)
GFR, Estimated: >60 mL/min

## 2024-02-17 LAB — MAGNESIUM: Magnesium: 1.6 mg/dL — ABNORMAL LOW (ref 1.7–2.4)

## 2024-02-17 LAB — MRSA NEXT GEN BY PCR, NASAL: MRSA by PCR Next Gen: NOT DETECTED

## 2024-02-17 LAB — PHOSPHORUS: Phosphorus: 2.6 mg/dL (ref 2.5–4.6)

## 2024-02-17 LAB — CORTISOL: Cortisol, Plasma: 2.9 ug/dL

## 2024-02-17 MED ORDER — JEVITY 1.5 CAL/FIBER PO LIQD
1000.0000 mL | ORAL | Status: DC
  Administered 2024-02-18: 1000 mL
  Filled 2024-02-17 (×2): qty 1000

## 2024-02-17 MED ORDER — LACTATED RINGERS IV BOLUS
500.0000 mL | Freq: Once | INTRAVENOUS | Status: AC
  Administered 2024-02-17: 500 mL via INTRAVENOUS

## 2024-02-17 MED ORDER — IOHEXOL 300 MG/ML  SOLN
50.0000 mL | Freq: Once | INTRAMUSCULAR | Status: AC | PRN
  Administered 2024-02-17: 50 mL

## 2024-02-17 MED ORDER — ORAL CARE MOUTH RINSE: 15.0000 mL | OROMUCOSAL | Status: DC | PRN

## 2024-02-17 MED ORDER — ORAL CARE MOUTH RINSE
15.0000 mL | OROMUCOSAL | Status: DC
  Administered 2024-02-17 – 2024-02-18 (×17): 15 mL via OROMUCOSAL

## 2024-02-17 MED ORDER — SODIUM CHLORIDE 0.9 % IV SOLN
250.0000 mL | INTRAVENOUS | Status: AC
  Administered 2024-02-17: 250 mL via INTRAVENOUS

## 2024-02-17 MED ORDER — VANCOMYCIN HCL 1250 MG/250ML IV SOLN: 1250.0000 mg | INTRAVENOUS | Status: DC

## 2024-02-17 MED ORDER — MAGNESIUM SULFATE 4 GM/100ML IV SOLN
4.0000 g | Freq: Once | INTRAVENOUS | Status: AC
  Administered 2024-02-17: 4 g via INTRAVENOUS
  Filled 2024-02-17: qty 100

## 2024-02-17 MED ORDER — POLYETHYLENE GLYCOL 3350 17 G PO PACK
17.0000 g | PACK | Freq: Every day | ORAL | Status: DC
  Administered 2024-02-18: 17 g
  Filled 2024-02-17: qty 1

## 2024-02-17 MED ORDER — FENTANYL BOLUS VIA INFUSION
25.0000 ug | INTRAVENOUS | Status: DC | PRN
  Administered 2024-02-17 (×2): 50 ug via INTRAVENOUS
  Administered 2024-02-17: 25 ug via INTRAVENOUS
  Administered 2024-02-17 – 2024-02-18 (×6): 50 ug via INTRAVENOUS

## 2024-02-17 MED ORDER — PROSOURCE TF20 ENFIT COMPATIBL EN LIQD
60.0000 mL | Freq: Every day | ENTERAL | Status: DC
  Administered 2024-02-18: 60 mL
  Filled 2024-02-17: qty 60

## 2024-02-17 MED ORDER — DEXMEDETOMIDINE HCL IN NACL 200 MCG/50ML IV SOLN: 0.0000 ug/kg/h | INTRAVENOUS | Status: DC

## 2024-02-17 MED ORDER — NOREPINEPHRINE 4 MG/250ML-% IV SOLN
0.0000 ug/min | INTRAVENOUS | Status: DC
  Administered 2024-02-17: 8 ug/min via INTRAVENOUS
  Filled 2024-02-17: qty 250

## 2024-02-17 MED ORDER — DEXTROSE IN LACTATED RINGERS 5 % IV SOLN: INTRAVENOUS | Status: DC

## 2024-02-17 MED ORDER — FENTANYL 2500MCG IN NS 250ML (10MCG/ML) PREMIX INFUSION: 0.0000 ug/h | INTRAVENOUS | Status: DC

## 2024-02-17 MED ORDER — PIPERACILLIN-TAZOBACTAM 3.375 G IVPB
3.3750 g | Freq: Three times a day (TID) | INTRAVENOUS | Status: DC
  Administered 2024-02-17 – 2024-02-19 (×6): 3.375 g via INTRAVENOUS
  Filled 2024-02-17 (×6): qty 50

## 2024-02-17 MED ORDER — CARMEX CLASSIC LIP BALM EX OINT
TOPICAL_OINTMENT | CUTANEOUS | Status: DC | PRN
  Filled 2024-02-17: qty 10

## 2024-02-17 MED ORDER — DOCUSATE SODIUM 50 MG/5ML PO LIQD
100.0000 mg | Freq: Two times a day (BID) | ORAL | Status: DC
  Administered 2024-02-17 – 2024-02-18 (×2): 100 mg
  Filled 2024-02-17 (×2): qty 10

## 2024-02-17 MED ORDER — NOREPINEPHRINE 4 MG/250ML-% IV SOLN
INTRAVENOUS | Status: AC
  Administered 2024-02-17: 8 ug/min via INTRAVENOUS
  Filled 2024-02-17: qty 250

## 2024-02-17 MED ORDER — LACTATED RINGERS IV BOLUS
1000.0000 mL | Freq: Once | INTRAVENOUS | Status: AC
  Administered 2024-02-17: 1000 mL via INTRAVENOUS

## 2024-02-17 NOTE — Progress Notes (Signed)
 Initial Nutrition Assessment  DOCUMENTATION CODES:  Not applicable  INTERVENTION:  Initiate tube feeding via PEG in AM once okayed by CCM: Jevity 1.5 at 50 ml/h (1200 ml per day) Start at 30 and advance by 10mL every 6 hours to reach goal Prosource TF20 60ml 1x/d (does not need to be continued at discharge) Free water : 125mL every 4 hours Provides 1880 kcal, 97 gm protein, 912 ml free water  daily ( free water  TF+flush) When stable for discharge, adjust back to bolus feeds. Pt to resume home formula of Isosource 1.5 at discharge.  NUTRITION DIAGNOSIS:   Inadequate oral intake related to inability to eat as evidenced by NPO status.  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 Wife concerned for aspiration event.  12/26 - presented to ED, intubated, admitted to ICU  Pt currently intubated on vent support. New consult received to initiate enteral support. Discussed with RN, MD plans to continue to decompress GI tract today but will start feeds tomorrow.   Per outpatient RD note 11/5, home regimen is as follows: 5 cartons of Isosource 1.5 provided daily with 90mL water  flushes pre/post feeds. (1875kcal, 85g protein)  Pt now weaned off pressor support and MD hopeful to attempt extubation in the AM. Pt's family does not want trach. Will scheduled feeds to start in the AM. Will plan continuous feeds on initiation and then adjust to bolus as pt gets ready to discharge home to ensure tolerance.    Noted lactic acid trending up.   MV: 9.3 L/min Temp (24hrs), Avg:99.1 F (37.3 C), Min:97 F (36.1 C), Max:99.9 F (37.7 C) MAP (cuff): 73-97 mmHg this afternoon  Admit  weight: 57.2 kg   Current weight: 57.7 kg    Intake/Output Summary (Last 24 hours) at 02/17/2024 1642 Last data filed at 02/17/2024 1600 Gross per 24 hour  Intake 5837.05 ml  Output 685 ml  Net 5152.05 ml  Net IO Since Admission: 5,152.05 mL [02/17/24 1642]  Drains/Lines: PEG RUQ OGT 18 Fr. (Gastric, being used for decompression) UOP x 24 hours  Nutritionally Relevant Medications: Scheduled Meds:  docusate  100 mg Per Tube BID   famotidine   20 mg Per Tube BID   insulin  aspart  0-15 Units Subcutaneous Q4H   polyethylene glycol  17 g Per Tube Daily   Continuous Infusions:  dextrose  5% lactated ringers      piperacillin -tazobactam (ZOSYN )  IV 3.375 g (02/17/24 1543)   PRN Meds: ondansetron   Labs Reviewed: Sodium 133 Creatinine 0.54 Magnesium  1.6 CBG ranges from 75-141 mg/dL over the last 24 hours HgbA1c 5.6%  NUTRITION - FOCUSED PHYSICAL EXAM: Defer to in-person assessment  Diet Order:   Diet Order             Diet NPO time specified  Diet effective now                   EDUCATION NEEDS:  Not appropriate for education at this time  Skin:  Skin Assessment: Reviewed RN Assessment  Last BM:  PTA  Height:   Ht Readings from Last 1 Encounters:  02/17/24 5' 7.5 (1.715 m)    Weight:   Wt Readings  from Last 1 Encounters:  02/17/24 57.7 kg    Ideal Body Weight:  60.6 kg (adjusted by 10% for quadriplegia)  BMI:  Body mass index is 19.63 kg/m.  Estimated Nutritional Needs:   Kcal:  1700-2000 kcal/d  Protein:  80-100g/d  Fluid:  >1.8L/d    Vernell Lukes, RD, LDN, CNSC Registered Dietitian II Please reach out via secure chat

## 2024-02-17 NOTE — Progress Notes (Addendum)
 eLink Physician-Brief Progress Note Patient Name: Guy Payne DOB: 1950/10/08 MRN: [id number]   Date of Service  02/17/2024  HPI/Events of Note  Patient in ICU on vent -50%, peep 5, RR 24, VT 420. Reviewed CCM note. ALS with pneumonia. No distress on camera.   eICU Interventions  Plan per bedside Call e link if needed      Intervention Category Major Interventions: Respiratory failure - evaluation and management Evaluation Type: New Patient Evaluation  Brandn Mcgath G Dymond Gutt 02/17/2024, 12:01 AM  Addendum at 12:09 am - Notified that patient dropped BP to 60s. Had precedex  and fentanyl  infusing from ER. RN held sedation and I ordered LR bolus as well as levophed  to help with the BP. Also ordered the sedative infusions to start once BP is better (did not see a current order in system).   Addendum at 1 am - On 10 mic of levo via PIV now. SBP dropped again. Sedation stopped. Will avoid precedex  entirely even though it was very low dose. Trying to use just fentanyl  . If not better then may need a CVC to help with sedation while on vent. Have asked Dr Maree from [facility] also to see him.

## 2024-02-17 NOTE — Plan of Care (Signed)
  Problem: Coping: Goal: Ability to adjust to condition or change in health will improve Outcome: Not Progressing   Problem: Fluid Volume: Goal: Ability to maintain a balanced intake and output will improve Outcome: Not Progressing   Problem: Health Behavior/Discharge Planning: Goal: Ability to manage health-related needs will improve Outcome: Not Progressing

## 2024-02-17 NOTE — Progress Notes (Signed)
 [facility] ADULT ICU REPLACEMENT PROTOCOL   The patient does apply for the [facility] Adult ICU Electrolyte Replacment Protocol based on the criteria listed below:   1.Exclusion criteria: TCTS, ECMO, Dialysis, and Myasthenia Gravis patients 2. Is GFR >/= 30 ml/min? Yes.    Patient's GFR today is >60 3. Is SCr </= 2? Yes.   Patient's SCr is 0.54 mg/dL 4. Did SCr increase >/= 0.5 in 24 hours? No. 5.Pt's weight >40kg  Yes.   6. Abnormal electrolyte(s): Magnesium   7. Electrolytes replaced per protocol 8.  Call MD STAT for K+ </= 2.5, Phos </= 1, or Mag </= 1 Physician:  Dr. Fate Medico A Vlad Mayberry 02/17/2024 6:39 AM

## 2024-02-17 NOTE — Progress Notes (Signed)
"  NAME:  Guy Payne, MRN:  [id number], DOB:  [birth year], LOS: 1 ADMISSION DATE:  02/16/2024, CONSULTATION DATE:  02/16/24 REFERRING MD:  Fairy Gravely, MD, CHIEF COMPLAINT:  Aspiration event   History of Present Illness:  73 y/o male with PMH for ALS x 4.5 years, has NIV but does not use it according to Neurology note, but per wife at beside he has no O2 and no NIV/PAP.  He is PEG tube fed only, no oral feeds/pleasure feeds.  He has multiple aspiration events in the past and he was in the ER in November after an aspiration event/pneumonia.   This time patient BIB wife and was unresponsive with agonal feeds.  Nursing staff pulled patient out of the car and BVM started.  Wife's concern for aspiration. LA 5.2, VBG PH 7.17, cxr unofficially, bilateral infiltrates and lasrge gastric /colonic gas.  Pertinent  Medical History  ALS  Significant Hospital Events: Including procedures, antibiotic start and stop dates in addition to other pertinent events   12/26: admit to ICU  Interim History / Subjective:   Remains intubated but awake and responsive Family at bedside Tolerating PSV 8/5  Objective    Blood pressure (!) 119/43, pulse (!) 101, temperature 99.5 F (37.5 C), resp. rate 17, height 5' 7.5 (1.715 m), weight 57.7 kg, SpO2 91%.    Vent Mode: PSV;CPAP FiO2 (%):  [40 %-100 %] 40 % Set Rate:  [24 bmp] 24 bmp Vt Set:  [420 mL] 420 mL PEEP:  [5 cmH20-8 cmH20] 8 cmH20 Plateau Pressure:  [16 cmH20-19 cmH20] 19 cmH20   Intake/Output Summary (Last 24 hours) at 02/17/2024 1138 Last data filed at 02/17/2024 1000 Gross per 24 hour  Intake 4605.9 ml  Output 465 ml  Net 4140.9 ml   Filed Weights   02/16/24 1945 02/17/24 0000 02/17/24 0446  Weight: 57.2 kg 57.7 kg 57.7 kg   Examination: General: intubated, lightly sedated, NAD HENT: no icterus, supple, no JVD no LN Lungs: CTA no wheezes no rales Cardiovascular: reg s1s2 no murmurs Abdomen: soft nt nd bs pos, peg in place, skin  surrounding peg is erythematous Extremities: no edema  Neuro: moving extremities, responding to commands GU: Foley  Resolved problem list   Assessment and Plan  Acute hypoxic respiratory failure - Vent management - SAT/SBT when medically appropriate - Follow O2 sats, ABG/VBGs - will plan for SAT/SBT tomorrow morning once GI tract is decompressed with OG  Acute Aspiration Pneumonia - continue zosyn  - Tracheal aspirates for culture  Gastric Distention Possible Ileus Peg tube in place - place OG to low intermittent suction - Keep NPO  Sepsis due to Pneumonia - continue zosyn  - follow up tracheal aspirate - stop vancomycin , MRSA screen negative - IV fluid resuscitated, continue with D5LR at 50mL/hr - trend lactic acid level  Mild Hyponatremia - IV fluids as above - trend  Hypomagnesemia - replete  ALS - Supportive care - Nutrition with PEG tube feels - Family does not want trach   Labs   CBC: Recent Labs  Lab 02/16/24 1921 02/16/24 1940 02/17/24 0147 02/17/24 0301  WBC  --  6.6 12.5* 14.8*  NEUTROABS  --  5.8  --   --   HGB 18.7* 16.5 13.4 14.7  HCT 55.0* 52.8* 42.6 47.0  MCV  --  99.4 102.2* 100.2*  PLT  --  204 120* 139*    Basic Metabolic Panel: Recent Labs  Lab 02/16/24 1921 02/16/24 2041 02/17/24 0147 02/17/24 0301  NA  141 144  --  133*  K 5.3* 4.3  --  4.7  CL 102 109  --  101  CO2  --  24  --  12*  GLUCOSE 152* 88  --  163*  BUN 38* 28*  --  21  CREATININE 0.90 0.72 0.49* 0.54*  CALCIUM  --  8.4*  --  8.1*  MG  --   --   --  1.6*  PHOS  --   --   --  2.6   GFR: Estimated Creatinine Clearance: 66.1 mL/min (A) (by C-G formula based on SCr of 0.54 mg/dL (L)). Recent Labs  Lab 02/16/24 1922 02/16/24 1940 02/17/24 0034 02/17/24 0147 02/17/24 0301  WBC  --  6.6  --  12.5* 14.8*  LATICACIDVEN 5.2*  --  >9.0*  --  8.0*    Liver Function Tests: Recent Labs  Lab 02/16/24 2041  AST 47*  ALT 41  ALKPHOS 108  BILITOT 0.6  PROT  6.9  ALBUMIN  3.3*   No results for input(s): LIPASE, AMYLASE in the last 168 hours. No results for input(s): AMMONIA in the last 168 hours.  ABG    Component Value Date/Time   PHART 7.35 02/17/2024 0505   PCO2ART 47 02/17/2024 0505   PO2ART 67 (L) 02/17/2024 0505   HCO3 26.0 02/17/2024 0505   TCO2 26 02/16/2024 1921   ACIDBASEDEF 0.1 02/16/2024 2120   O2SAT 97.4 02/17/2024 0505     Coagulation Profile: Recent Labs  Lab 02/16/24 1940  INR 1.2    Cardiac Enzymes: No results for input(s): CKTOTAL, CKMB, CKMBINDEX, TROPONINI in the last 168 hours.  HbA1C: Hgb A1c MFr Bld  Date/Time Value Ref Range Status  02/17/2024 12:31 AM 5.6 4.8 - 5.6 % Final    Comment:    (NOTE) Diagnosis of Diabetes The following HbA1c ranges recommended by the American Diabetes Association (ADA) may be used as an aid in the diagnosis of diabetes mellitus.  Hemoglobin             Suggested A1C NGSP%              Diagnosis  <5.7                   Non Diabetic  5.7-6.4                Pre-Diabetic  >6.4                   Diabetic  <7.0                   Glycemic control for                       adults with diabetes.      CBG: Recent Labs  Lab 02/16/24 1920 02/17/24 0008 02/17/24 0354 02/17/24 0801  GLUCAP 131* 118* 141* 121*    Critical care time: 60   The patient is critically ill with multiple organ system failure and requires high complexity decision making for assessment and support, frequent evaluation and titration of therapies, advanced monitoring, review of radiographic studies and interpretation of complex data.   Critical Care Time devoted to patient care services, exclusive of separately billable procedures, described in this note is 35 minutes.   Dorn Chill, MD [facility] Office: [phone]   See Amion for personal pager PCCM on call pager [phone] until 7pm. Please call Elink 7p-7a.  [phone] ° ° ° ° ° ° °   ° °"

## 2024-02-18 ENCOUNTER — Inpatient Hospital Stay (HOSPITAL_COMMUNITY)

## 2024-02-18 DIAGNOSIS — Z931 Gastrostomy status: Secondary | ICD-10-CM | POA: Diagnosis not present

## 2024-02-18 DIAGNOSIS — A419 Sepsis, unspecified organism: Secondary | ICD-10-CM | POA: Diagnosis not present

## 2024-02-18 DIAGNOSIS — J9601 Acute respiratory failure with hypoxia: Secondary | ICD-10-CM | POA: Diagnosis not present

## 2024-02-18 DIAGNOSIS — J690 Pneumonitis due to inhalation of food and vomit: Secondary | ICD-10-CM | POA: Diagnosis not present

## 2024-02-18 LAB — CBC WITH DIFFERENTIAL/PLATELET
Abs Immature Granulocytes: 0.08 K/uL — ABNORMAL HIGH (ref 0.00–0.07)
Basophils Absolute: 0.1 K/uL (ref 0.0–0.1)
Basophils Relative: 0 %
Eosinophils Absolute: 0.1 K/uL (ref 0.0–0.5)
Eosinophils Relative: 1 %
HCT: 33.9 % — ABNORMAL LOW (ref 39.0–52.0)
Hemoglobin: 11.3 g/dL — ABNORMAL LOW (ref 13.0–17.0)
Immature Granulocytes: 1 %
Lymphocytes Relative: 3 %
Lymphs Abs: 0.5 K/uL — ABNORMAL LOW (ref 0.7–4.0)
MCH: 31.7 pg (ref 26.0–34.0)
MCHC: 33.3 g/dL (ref 30.0–36.0)
MCV: 95.2 fL (ref 80.0–100.0)
Monocytes Absolute: 1 K/uL (ref 0.1–1.0)
Monocytes Relative: 6 %
Neutro Abs: 13.4 K/uL — ABNORMAL HIGH (ref 1.7–7.7)
Neutrophils Relative %: 89 %
Platelets: 137 K/uL — ABNORMAL LOW (ref 150–400)
RBC: 3.56 MIL/uL — ABNORMAL LOW (ref 4.22–5.81)
RDW: 13.7 % (ref 11.5–15.5)
WBC: 15.1 K/uL — ABNORMAL HIGH (ref 4.0–10.5)
nRBC: 0 % (ref 0.0–0.2)

## 2024-02-18 LAB — BASIC METABOLIC PANEL WITH GFR
Anion gap: 5 (ref 5–15)
BUN: 21 mg/dL (ref 8–23)
CO2: 28 mmol/L (ref 22–32)
Calcium: 8.3 mg/dL — ABNORMAL LOW (ref 8.9–10.3)
Chloride: 105 mmol/L (ref 98–111)
Creatinine, Ser: 0.63 mg/dL (ref 0.61–1.24)
GFR, Estimated: >60 mL/min
Glucose, Bld: 99 mg/dL (ref 70–99)
Potassium: 4.1 mmol/L (ref 3.5–5.1)
Sodium: 138 mmol/L (ref 135–145)

## 2024-02-18 LAB — GLUCOSE, CAPILLARY
Glucose-Capillary: 87 mg/dL (ref 70–99)
Glucose-Capillary: 81 mg/dL (ref 70–99)
Glucose-Capillary: 99 mg/dL (ref 70–99)
Glucose-Capillary: 104 mg/dL — ABNORMAL HIGH (ref 70–99)

## 2024-02-18 LAB — PHOSPHORUS: Phosphorus: 2.4 mg/dL — ABNORMAL LOW (ref 2.5–4.6)

## 2024-02-18 LAB — MAGNESIUM: Magnesium: 2.2 mg/dL (ref 1.7–2.4)

## 2024-02-18 LAB — LACTIC ACID, PLASMA
Lactic Acid, Venous: 0.9 mmol/L (ref 0.5–1.9)
Lactic Acid, Venous: 1 mmol/L (ref 0.5–1.9)

## 2024-02-18 MED ORDER — ACETAMINOPHEN 325 MG PO TABS: 650.0000 mg | ORAL_TABLET | Freq: Four times a day (QID) | ORAL | Status: DC | PRN

## 2024-02-18 MED ORDER — LACTATED RINGERS IV SOLN: INTRAVENOUS | Status: DC

## 2024-02-18 MED ORDER — ACETAMINOPHEN 650 MG RE SUPP: 650.0000 mg | Freq: Four times a day (QID) | RECTAL | Status: DC | PRN

## 2024-02-18 MED ORDER — FENTANYL CITRATE (PF) 50 MCG/ML IJ SOSY
PREFILLED_SYRINGE | INTRAMUSCULAR | Status: AC
  Filled 2024-02-18: qty 1

## 2024-02-18 MED ORDER — DONEPEZIL HCL 10 MG PO TABS: 10.0000 mg | ORAL_TABLET | Freq: Every day | ORAL | Status: DC

## 2024-02-18 MED ORDER — FENTANYL BOLUS VIA INFUSION: 100.0000 ug | INTRAVENOUS | Status: DC | PRN

## 2024-02-18 MED ORDER — FENTANYL CITRATE (PF) 50 MCG/ML IJ SOSY
25.0000 ug | PREFILLED_SYRINGE | INTRAMUSCULAR | Status: DC | PRN
  Administered 2024-02-18 (×2): 25 ug via INTRAVENOUS

## 2024-02-18 MED ORDER — DEXMEDETOMIDINE HCL IN NACL 200 MCG/50ML IV SOLN
0.0000 ug/kg/h | INTRAVENOUS | Status: DC
  Administered 2024-02-18: 0.4 ug/kg/h via INTRAVENOUS
  Administered 2024-02-19 (×2): 0.7 ug/kg/h via INTRAVENOUS
  Filled 2024-02-18 (×4): qty 50

## 2024-02-18 MED ORDER — GLYCOPYRROLATE 0.2 MG/ML IJ SOLN: 0.2000 mg | INTRAMUSCULAR | Status: DC | PRN

## 2024-02-18 MED ORDER — SODIUM PHOSPHATES 45 MMOLE/15ML IV SOLN
15.0000 mmol | Freq: Once | INTRAVENOUS | Status: AC
  Administered 2024-02-18: 15 mmol via INTRAVENOUS
  Filled 2024-02-18: qty 5

## 2024-02-18 MED ORDER — HYDRALAZINE HCL 20 MG/ML IJ SOLN
10.0000 mg | Freq: Four times a day (QID) | INTRAMUSCULAR | Status: DC | PRN
  Administered 2024-02-18: 10 mg via INTRAVENOUS
  Filled 2024-02-18: qty 1

## 2024-02-18 MED ORDER — SODIUM CHLORIDE 0.9 % IV SOLN: INTRAVENOUS | Status: DC

## 2024-02-18 MED ORDER — GLYCOPYRROLATE 1 MG PO TABS: 1.0000 mg | ORAL_TABLET | ORAL | Status: DC | PRN

## 2024-02-18 MED ORDER — POLYVINYL ALCOHOL 1.4 % OP SOLN: 1.0000 [drp] | Freq: Four times a day (QID) | OPHTHALMIC | Status: DC | PRN

## 2024-02-18 MED ORDER — HALOPERIDOL LACTATE 5 MG/ML IJ SOLN: 2.5000 mg | INTRAMUSCULAR | Status: DC | PRN

## 2024-02-18 MED ORDER — MIDAZOLAM HCL (PF) 2 MG/2ML IJ SOLN
2.0000 mg | INTRAMUSCULAR | Status: DC | PRN
  Administered 2024-02-18 – 2024-02-19 (×2): 2 mg via INTRAVENOUS
  Filled 2024-02-18 (×3): qty 2

## 2024-02-18 MED ORDER — TIZANIDINE HCL 4 MG PO TABS: 4.0000 mg | ORAL_TABLET | Freq: Three times a day (TID) | ORAL | Status: DC | PRN

## 2024-02-18 MED ORDER — LACTATED RINGERS IV BOLUS
500.0000 mL | Freq: Once | INTRAVENOUS | Status: AC
  Administered 2024-02-18: 500 mL via INTRAVENOUS

## 2024-02-18 MED ORDER — GLYCOPYRROLATE 0.2 MG/ML IJ SOLN
0.2000 mg | INTRAMUSCULAR | Status: DC | PRN
  Administered 2024-02-18: 0.2 mg via INTRAVENOUS
  Filled 2024-02-18 (×2): qty 1

## 2024-02-18 MED ORDER — FENTANYL 2500MCG IN NS 250ML (10MCG/ML) PREMIX INFUSION
0.0000 ug/h | INTRAVENOUS | Status: DC
  Administered 2024-02-18: 50 ug/h via INTRAVENOUS
  Filled 2024-02-18: qty 250

## 2024-02-18 NOTE — Progress Notes (Signed)
 [name] at bedside with son and other family and friends with questions as to why the IV fluids and tube feeds are stopped. I explained the status of comfort measures and I would clarify active orders with the eLink provider. Elink provider notified and discussed the orders (see note), what comfort measures are and went over the patient's request discussed with Dr. Kara.   At this time the patient's wife would like to restart tube feed, IV fluids, SQ heparin , use BiPAP for any air hunger, IV antibiotics, continue pain meds, and do not want other by mouth meds to be given. Charge nurse notified and went to clarify interventions and to explain the risk of using BiPAP on patient that is not alert, awake and able to take BiPAP off and the risk of aspiration episodes. [name] is adamant about continuing to use BiPAP if patient develops air hunger and will discuss further wishes with Dr. Kara in the morning.

## 2024-02-18 NOTE — Progress Notes (Signed)
 "  NAME:  Guy Payne, MRN:  [id number], DOB:  1950/08/13, LOS: 2 ADMISSION DATE:  02/16/2024, CONSULTATION DATE:  02/16/24 REFERRING MD:  Fairy Gravely, MD, CHIEF COMPLAINT:  Aspiration event   History of Present Illness:  73 y/o male with PMH for ALS x 4.5 years, has NIV but does not use it according to Neurology note, but per wife at beside he has no O2 and no NIV/PAP.  He is PEG tube fed only, no oral feeds/pleasure feeds.  He has multiple aspiration events in the past and he was in the ER in November after an aspiration event/pneumonia.   This time patient BIB wife and was unresponsive with agonal feeds.  Nursing staff pulled patient out of the car and BVM started.  Wife's concern for aspiration. LA 5.2, VBG PH 7.17, cxr unofficially, bilateral infiltrates and lasrge gastric /colonic gas.  Pertinent  Medical History  ALS  Significant Hospital Events: Including procedures, antibiotic start and stop dates in addition to other pertinent events   12/26: admit to ICU 12/27 remained intubated due to gastric distention, OG placed to low intermittent suction  Interim History / Subjective:   Remains intubated but awake and responsive No acute events overnight   Objective    Blood pressure (!) 168/82, pulse 88, temperature 100.2 F (37.9 C), resp. rate 16, height 5' 7.5 (1.715 m), weight 64.7 kg, SpO2 95%.    Vent Mode: PSV;CPAP FiO2 (%):  [40 %] 40 % Set Rate:  [24 bmp] 24 bmp Vt Set:  [420 mL] 420 mL PEEP:  [5 cmH20-8 cmH20] 5 cmH20 Plateau Pressure:  [14 cmH20-19 cmH20] 14 cmH20   Intake/Output Summary (Last 24 hours) at 02/18/2024 0933 Last data filed at 02/18/2024 0800 Gross per 24 hour  Intake 3009.38 ml  Output 990 ml  Net 2019.38 ml   Filed Weights   02/17/24 0000 02/17/24 0446 02/18/24 0500  Weight: 57.7 kg 57.7 kg 64.7 kg   Examination: General: intubated, lightly sedated, NAD HENT: no icterus, supple, no JVD no LN Lungs: CTA no wheezes no  rales Cardiovascular: reg s1s2 no murmurs Abdomen: soft nt nd bs pos, peg in place, skin surrounding peg is erythematous Extremities: no edema  Neuro: moving extremities, responding to commands GU: Foley  Resolved problem list  Mild hyponatremia  Assessment and Plan  Acute hypoxic respiratory failure - Vent management - SAT/SBT today - Follow O2 sats, ABG/VBGs  Acute Aspiration Pneumonia - continue zosyn  - Tracheal aspirates for culture  Gastric Distention Possible Ileus Peg tube in place - improved based on KUB - will remove OG if able to extubate - will consider resuming trickle tube feeds today  Sepsis due to Pneumonia - continue zosyn  - follow up tracheal aspirate - IV fluid resuscitated, continue with D5LR at 39mL/hr - trend lactic acid level  Hypomagnesemia Hypophosphatemia - replete  ALS - Supportive care - Nutrition with PEG tube feeds when able - Family does not want trach   Labs   CBC: Recent Labs  Lab 02/16/24 1921 02/16/24 1940 02/17/24 0147 02/17/24 0301 02/18/24 0302  WBC  --  6.6 12.5* 14.8* 15.1*  NEUTROABS  --  5.8  --   --  13.4*  HGB 18.7* 16.5 13.4 14.7 11.3*  HCT 55.0* 52.8* 42.6 47.0 33.9*  MCV  --  99.4 102.2* 100.2* 95.2  PLT  --  204 120* 139* 137*    Basic Metabolic Panel: Recent Labs  Lab 02/16/24 1921 02/16/24 2041 02/17/24 0147 02/17/24 0301  02/18/24 0302  NA 141 144  --  133* 138  K 5.3* 4.3  --  4.7 4.1  CL 102 109  --  101 105  CO2  --  24  --  12* 28  GLUCOSE 152* 88  --  163* 99  BUN 38* 28*  --  21 21  CREATININE 0.90 0.72 0.49* 0.54* 0.63  CALCIUM  --  8.4*  --  8.1* 8.3*  MG  --   --   --  1.6* 2.2  PHOS  --   --   --  2.6 2.4*   GFR: Estimated Creatinine Clearance: 74.1 mL/min (by C-G formula based on SCr of 0.63 mg/dL). Recent Labs  Lab 02/16/24 1940 02/17/24 0034 02/17/24 0147 02/17/24 0301 02/17/24 1056 02/17/24 1449 02/18/24 0302  WBC 6.6  --  12.5* 14.8*  --   --  15.1*  LATICACIDVEN   --  >9.0*  --  8.0* 4.9* 6.7*  --     Liver Function Tests: Recent Labs  Lab 02/16/24 2041  AST 47*  ALT 41  ALKPHOS 108  BILITOT 0.6  PROT 6.9  ALBUMIN  3.3*   No results for input(s): LIPASE, AMYLASE in the last 168 hours. No results for input(s): AMMONIA in the last 168 hours.  ABG    Component Value Date/Time   PHART 7.35 02/17/2024 0505   PCO2ART 47 02/17/2024 0505   PO2ART 67 (L) 02/17/2024 0505   HCO3 26.0 02/17/2024 0505   TCO2 26 02/16/2024 1921   ACIDBASEDEF 0.1 02/16/2024 2120   O2SAT 97.4 02/17/2024 0505     Coagulation Profile: Recent Labs  Lab 02/16/24 1940  INR 1.2    Cardiac Enzymes: No results for input(s): CKTOTAL, CKMB, CKMBINDEX, TROPONINI in the last 168 hours.  HbA1C: Hgb A1c MFr Bld  Date/Time Value Ref Range Status  02/17/2024 12:31 AM 5.6 4.8 - 5.6 % Final    Comment:    (NOTE) Diagnosis of Diabetes The following HbA1c ranges recommended by the American Diabetes Association (ADA) may be used as an aid in the diagnosis of diabetes mellitus.  Hemoglobin             Suggested A1C NGSP%              Diagnosis  <5.7                   Non Diabetic  5.7-6.4                Pre-Diabetic  >6.4                   Diabetic  <7.0                   Glycemic control for                       adults with diabetes.      CBG: Recent Labs  Lab 02/17/24 1220 02/17/24 1524 02/17/24 2052 02/18/24 0349 02/18/24 0742  GLUCAP 75 78 78 87 81    Critical care time: 31   The patient is critically ill with multiple organ system failure and requires high complexity decision making for assessment and support, frequent evaluation and titration of therapies, advanced monitoring, review of radiographic studies and interpretation of complex data.   Critical Care Time devoted to patient care services, exclusive of separately billable procedures, described in this note is 31 minutes.   Dorn Chill,  MD [facility] Office: [phone]   See Amion for personal pager PCCM on call pager [phone] until 7pm. Please call Elink 7p-7a. [phone]          "

## 2024-02-18 NOTE — Progress Notes (Signed)
 eLink Physician-Brief Progress Note Patient Name: Guy Payne DOB: 1950/11/14 MRN: [id number]   Date of Service  02/18/2024  HPI/Events of Note  Patient has an order for DNR/comfort which was placed at 5:30 PM.  He still has tube feed orders, CBG with SSI, IV antibiotics and IV fluids.  RN requesting clarification.  eICU Interventions  Chart reviewed.  According to documentation, a discussion earlier between intensivist and patient together with her spouse DURING which patient was awake.  He stated that he wanted noninvasive ventilation to be tried briefly if need be. To be audiovisual conference with patient's spouse and family present.  She does want the BiPAP mask and IV fluids as well as IV antibiotics for now.  Patient appears comfortable currently.  Will leave everything status quo and reevaluate if there are any changes.  Otherwise, discussion can be held again in the morning to clarify comfort for patient and her spouse. Discussed initially with RN over the phone and again during the A/V conference during which she was present.     Intervention Category Major Interventions: End of life / care limitation discussion  Guy Payne 02/18/2024, 9:23 PM  6:37 AM Addendum:  [name] now wants patient to go for comfort measures.  Spoke to patient's wife by two-way A/V interaction.  IV fluids, IV antibiotics and tube feeds discontinued.  Oxygen discontinued.  Patient appears very comfortable.  Continue morphine  as needed.

## 2024-02-18 NOTE — Procedures (Signed)
 Extubation Procedure Note  Patient Details:   Name: Guy Payne DOB: [birth year] MRN: [id number]   Airway Documentation:    Vent end date: 02/18/24 Vent end time: 1057   Evaluation  O2 sats: stable throughout Complications: No apparent complications Patient did tolerate procedure well. Bilateral Breath Sounds: Clear, Diminished   No  Patient tolerated wean. MD ordered to extubate. Positive for cuff leak. Patient extubated to a 5Lpm [place]. No signs of dyspnea or stridor noted. RN and family at bedside. Patient resting comfortably.  [name] 02/18/2024, 11:09 AM

## 2024-02-18 NOTE — IPAL (Signed)
" °  Interdisciplinary Goals of Care Family Meeting   Date carried out: 02/18/2024  Location of the meeting: Bedside  Member's involved: Physician, Bedside Registered Nurse, and Family Member or next of kin  Durable Power of Attorney or acting medical decision maker: Asberry Dunson    Discussion: We discussed goals of care for [employer] .  Patient has not done well post extubation today. We discussed concern for progression of his ALS. Patient was able to participate in his care decisions. He does not want to be reintubated. We discussed concern of him passing away without mechanical ventilatory support. He expressed understanding and stated he is ready to go to heaven. He is ok with Bipap for a period of time, but ultimately wants to be comfortable. Asberry acknowledged his wishes. Code status will be updated to DNR.  Code status:   Code Status: Do not attempt resuscitation (DNR) - Comfort care   Disposition: In-patient comfort care  Time spent for the meeting: 25 minutes    Dorn KATHEE Chill, MD  02/18/2024, 5:25 PM   "

## 2024-02-19 ENCOUNTER — Telehealth: Payer: Self-pay

## 2024-02-19 DIAGNOSIS — J9601 Acute respiratory failure with hypoxia: Secondary | ICD-10-CM | POA: Diagnosis not present

## 2024-02-19 DIAGNOSIS — J690 Pneumonitis due to inhalation of food and vomit: Secondary | ICD-10-CM | POA: Diagnosis not present

## 2024-02-19 DIAGNOSIS — G1221 Amyotrophic lateral sclerosis: Secondary | ICD-10-CM | POA: Diagnosis not present

## 2024-02-19 DIAGNOSIS — A419 Sepsis, unspecified organism: Secondary | ICD-10-CM | POA: Diagnosis not present

## 2024-02-19 MED ORDER — MORPHINE BOLUS VIA INFUSION
5.0000 mg | INTRAVENOUS | Status: DC | PRN
  Administered 2024-02-19: 5 mg via INTRAVENOUS

## 2024-02-19 MED ORDER — MORPHINE 100MG IN NS 100ML (1MG/ML) PREMIX INFUSION
0.0000 mg/h | INTRAVENOUS | Status: DC
  Administered 2024-02-19: 5 mg/h via INTRAVENOUS
  Filled 2024-02-19: qty 100

## 2024-02-19 MED FILL — Fentanyl Citrate-NaCl 0.9% IV Soln 2.5 MG/250ML: INTRAVENOUS | Qty: 250 | Status: AC

## 2024-02-21 LAB — CULTURE, BLOOD (ROUTINE X 2)
Culture: NO GROWTH
Special Requests: ADEQUATE

## 2024-02-21 LAB — CULTURE, RESPIRATORY W GRAM STAIN

## 2024-02-22 LAB — CULTURE, BLOOD (ROUTINE X 2)
Culture: NO GROWTH
Special Requests: ADEQUATE

## 2024-02-22 NOTE — Plan of Care (Signed)
  Problem: Education: Goal: Ability to describe self-care measures that may prevent or decrease complications (Diabetes Survival Skills Education) will improve Outcome: Not Applicable Goal: Individualized Educational Video(s) Outcome: Not Applicable   Problem: Coping: Goal: Ability to adjust to condition or change in health will improve Outcome: Not Applicable   Problem: Fluid Volume: Goal: Ability to maintain a balanced intake and output will improve Outcome: Not Applicable   Problem: Health Behavior/Discharge Planning: Goal: Ability to identify and utilize available resources and services will improve Outcome: Not Applicable Goal: Ability to manage health-related needs will improve Outcome: Not Applicable   Problem: Metabolic: Goal: Ability to maintain appropriate glucose levels will improve Outcome: Not Applicable   Problem: Nutritional: Goal: Maintenance of adequate nutrition will improve Outcome: Not Applicable Goal: Progress toward achieving an optimal weight will improve Outcome: Not Applicable   Problem: Skin Integrity: Goal: Risk for impaired skin integrity will decrease Outcome: Not Applicable   Problem: Tissue Perfusion: Goal: Adequacy of tissue perfusion will improve Outcome: Not Applicable   Problem: Education: Goal: Knowledge of General Education information will improve Description: Including pain rating scale, medication(s)/side effects and non-pharmacologic comfort measures Outcome: Not Applicable   Problem: Health Behavior/Discharge Planning: Goal: Ability to manage health-related needs will improve Outcome: Not Applicable   Problem: Clinical Measurements: Goal: Ability to maintain clinical measurements within normal limits will improve Outcome: Not Applicable Goal: Will remain free from infection Outcome: Not Applicable Goal: Diagnostic test results will improve Outcome: Not Applicable Goal: Respiratory complications will improve Outcome: Not  Applicable Goal: Cardiovascular complication will be avoided Outcome: Not Applicable   Problem: Activity: Goal: Risk for activity intolerance will decrease Outcome: Not Applicable   Problem: Nutrition: Goal: Adequate nutrition will be maintained Outcome: Not Applicable   Problem: Coping: Goal: Level of anxiety will decrease Outcome: Not Applicable   Problem: Elimination: Goal: Will not experience complications related to bowel motility Outcome: Not Applicable Goal: Will not experience complications related to urinary retention Outcome: Not Applicable   Problem: Pain Managment: Goal: General experience of comfort will improve and/or be controlled Outcome: Not Applicable   Problem: Safety: Goal: Ability to remain free from injury will improve Outcome: Not Applicable   Problem: Skin Integrity: Goal: Risk for impaired skin integrity will decrease Outcome: Not Applicable

## 2024-02-22 NOTE — Death Summary Note (Signed)
 " DEATH SUMMARY   Patient Details  Name: Guy Payne MRN: [id number] DOB: 06/04/1950  Admission/Discharge Information   Admit Date:  02-28-24  Date of Death: Date of Death: 03-02-2024  Time of Death: Time of Death: 03-09-20  Length of Stay: 3  Referring Physician: Dayna Motto, DO   Reason(s) for Hospitalization  Aspiration Pneumonia  Diagnoses  Preliminary cause of death:  Acute Hypoxemic Respiratory Failure  Secondary Diagnoses (including complications and co-morbidities):  Principal Problem:   Acute hypoxic respiratory failure (HCC) Amyotrophic Lateral Sclerosis S/p PEG Severe Protein Calorie Malnutrition  Brief Hospital Course (including significant findings, care, treatment, and services provided and events leading to death)  Guy Payne is a 74 y.o. year old male with with history of ALS who was admitted 02-27-2025 for aspiration pneumonia and acute hypoxemic respiratory failure. He was intubated and admitted to the ICU. He was treated with antibiotics for aspiration. An OG tube was placed to decompress gastric distention. He tolerated SBT on 12/27 and 12/28. He was extubated on 12/28 but developed progressive respiratory failure later in the day. Discussion held with wife at bedside. Patient did not want to be reintubated. He did not want trach. Discussed that his ALS was likely progressing giving him worsening dysphagia and neuromuscular respiratory failure. Patient understood and expressed wishes to be made comfortable. He was placed on bipap and narcotic pain medication was started for comfort. His family was notified. Once he had a chance to visit with all family, bipap and oxygen were removed. He passed away 2024/03/02 at 10:22am.    Pertinent Labs and Studies  Significant Diagnostic Studies DG Abd 1 View Result Date: 02/18/2024 CLINICAL DATA:  Ileus. EXAM: ABDOMEN - 1 VIEW COMPARISON:  Radiograph yesterday FINDINGS: Enteric contrast previously seen within stomach and  small bowel is now in the ascending and transverse colon. No convincing gaseous small bowel distension. Tip and side port of the enteric tube below the diaphragm. Penile prosthesis. IMPRESSION: Enteric contrast previously seen within stomach and small bowel is now in the ascending and transverse colon. No convincing gaseous small bowel distension. Electronically Signed   By: Andrea Gasman M.D.   On: 02/18/2024 15:40   DG Abd Portable 1V Result Date: 02/17/2024 CLINICAL DATA:  Encounter for care related to feeding tube. EXAM: PORTABLE ABDOMEN - 1 VIEW COMPARISON:  02/17/2024 FINDINGS: Interval insertion of orogastric tube, which terminates at the level of the gastric body. Previously seen G-tube is not well visualized on the current examination. Oral contrast is present within the gastric fundus as well as distal small bowel in the RIGHT lower quadrant. No dilated loops of bowel to indicate ileus or obstruction. IMPRESSION: 1. Interval placement of orogastric tube, which terminates at the level of the gastric body. 2. No evidence of bowel obstruction. Previously seen gastrostomy tube is not well visualized on the current exam. Electronically Signed   By: Aliene Lloyd M.D.   On: 02/17/2024 15:40   DG ABDOMEN PEG TUBE LOCATION Result Date: 02/17/2024 CLINICAL DATA:  Peg tube placement. EXAM: ABDOMEN - 1 VIEW COMPARISON:  05/19/2023 FINDINGS: Gastrostomy tube overlies the left abdomen. There is contrast material in the lumen of the stomach and proximal duodenum. No definite extravasated contrast in the peritoneal cavity. IMPRESSION: Contrast material opacifies the gastric lumen consistent with G-tube placement in the stomach. Electronically Signed   By: Camellia Candle M.D.   On: 02/17/2024 10:56   DG CHEST PORT 1 VIEW Result Date: 02/17/2024 EXAM: 1 VIEW(S) XRAY  OF THE CHEST 02/17/2024 06:11:00 AM COMPARISON: 02/16/2024 CLINICAL HISTORY: 74 year old male with sepsis, suspected bilateral pneumonia, and  aspiration pneumonia (hepatocellular carcinoma). FINDINGS: LINES, TUBES AND DEVICES: Stable, satisfactory endotracheal tube in place with tip 4.7 cm above the carina. LUNGS AND PLEURA: Increased patchy airspace opacities at the left lung base. Increased confluence of airspace opacities at the right lung base. New blunting of the right costophrenic angle, questionable small pleural effusion. No pneumothorax. HEART AND MEDIASTINUM: Atherosclerotic calcification of the aortic arch. No acute abnormality of the cardiac and mediastinal silhouettes. BONES AND SOFT TISSUES: Thoracic spondylosis. No acute osseous abnormality. Less gaseous distention of stomach / bowel in the visible upper abdomen, less pronounced than on previous exam. IMPRESSION: 1. Bilateral lower lung pneumonia with increased confluence of lung base opacity. 2. Satisfactory ET tube. Electronically signed by: Helayne Hurst MD 02/17/2024 06:23 AM EST RP Workstation: [id number]   DG Chest Port 1 View Result Date: 02/16/2024 EXAM: 1 VIEW(S) XRAY OF THE CHEST 02/16/2024 08:01:45 PM COMPARISON: CXR 12/24/2023, CT chest 06/20/2023. CLINICAL HISTORY: Questionable sepsis - evaluate for abnormality. FINDINGS: LINES, TUBES AND DEVICES: Endotracheal tube in place with tip 5 cm above carina. LUNGS AND PLEURA: Bilateral lower and mid lung zone patchy airspace opacities. Possible trace bilateral pleural effusions. No pneumothorax. HEART AND MEDIASTINUM: Aortic atherosclerosis. No acute abnormality of the cardiac and mediastinal silhouettes. BONES AND SOFT TISSUES: Prominent gaseous distention of stomach. No acute osseous abnormality. IMPRESSION: 1. Bilateral mid and lower lung zone patchy airspace opacities. 2. Possible trace bilateral pleural effusions. 3. Endotracheal tube tip 5 cm above the carina. 4. Prominent gaseous distention of the stomach. Electronically signed by: Morgane Naveau MD 02/16/2024 09:42 PM EST RP Workstation: [id number]    Microbiology Recent  Results (from the past 240 hours)  Blood Culture (routine x 2)     Status: None (Preliminary result)   Collection Time: 02/16/24  8:13 PM   Specimen: Site Not Specified; Blood  Result Value Ref Range Status   Specimen Description   Final    SITE NOT SPECIFIED Performed at [facility], [address], [place], [place] [address]    Special Requests   Final    BOTTLES DRAWN AEROBIC AND ANAEROBIC Blood Culture adequate volume Performed at [facility], [address], [place], [place] [address]    Culture   Final    NO GROWTH 3 DAYS Performed at [facility], [address], [place], [place] [address]    Report Status PENDING  Incomplete  Resp panel by RT-PCR (RSV, Flu A&B, Covid) Anterior Nasal Swab     Status: None   Collection Time: 02/16/24  8:38 PM   Specimen: Anterior Nasal Swab  Result Value Ref Range Status   SARS Coronavirus 2 by RT PCR NEGATIVE NEGATIVE Final    Comment: (NOTE) SARS-CoV-2 target nucleic acids are NOT DETECTED.  The SARS-CoV-2 RNA is generally detectable in upper respiratory specimens during the acute phase of infection. The lowest concentration of SARS-CoV-2 viral copies this assay can detect is 138 copies/mL. A negative result does not preclude SARS-Cov-2 infection and should not be used as the sole basis for treatment or other patient management decisions. A negative result may occur with  improper specimen collection/handling, submission of specimen other than nasopharyngeal swab, presence of viral mutation(s) within the areas targeted by this assay, and inadequate number of viral copies(<138 copies/mL). A negative result must be combined with clinical observations, patient history, and epidemiological information. The  expected result is Negative.  Fact Sheet for Patients:  bloggercourse.com  Fact Sheet for Healthcare Providers:  seriousbroker.it  This  test is no t yet approved or cleared by the [place]  FDA and  has been authorized for detection and/or diagnosis of SARS-CoV-2 by FDA under an Emergency Use Authorization (EUA). This EUA will remain  in effect (meaning this test can be used) for the duration of the COVID-19 declaration under Section 564(b)(1) of the Act, 21 U.S.C.section 360bbb-3(b)(1), unless the authorization is terminated  or revoked sooner.       Influenza A by PCR NEGATIVE NEGATIVE Final   Influenza B by PCR NEGATIVE NEGATIVE Final    Comment: (NOTE) The Xpert Xpress SARS-CoV-2/FLU/RSV plus assay is intended as an aid in the diagnosis of influenza from Nasopharyngeal swab specimens and should not be used as a sole basis for treatment. Nasal washings and aspirates are unacceptable for Xpert Xpress SARS-CoV-2/FLU/RSV testing.  Fact Sheet for Patients: bloggercourse.com  Fact Sheet for Healthcare Providers: seriousbroker.it  This test is not yet approved or cleared by the [place]  FDA and has been authorized for detection and/or diagnosis of SARS-CoV-2 by FDA under an Emergency Use Authorization (EUA). This EUA will remain in effect (meaning this test can be used) for the duration of the COVID-19 declaration under Section 564(b)(1) of the Act, 21 U.S.C. section 360bbb-3(b)(1), unless the authorization is terminated or revoked.     Resp Syncytial Virus by PCR NEGATIVE NEGATIVE Final    Comment: (NOTE) Fact Sheet for Patients: bloggercourse.com  Fact Sheet for Healthcare Providers: seriousbroker.it  This test is not yet approved or cleared by the [place]  FDA and has been authorized for detection and/or diagnosis of SARS-CoV-2 by FDA under an Emergency Use Authorization (EUA). This EUA will remain in effect (meaning this test can be used) for the duration of the COVID-19 declaration under  Section 564(b)(1) of the Act, 21 U.S.C. section 360bbb-3(b)(1), unless the authorization is terminated or revoked.  Performed at [facility], [address], [place], [place] [address]   MRSA Next Gen by PCR, Nasal     Status: None   Collection Time: 02/16/24 11:46 PM   Specimen: Nasal Mucosa; Nasal Swab  Result Value Ref Range Status   MRSA by PCR Next Gen NOT DETECTED NOT DETECTED Final    Comment: (NOTE) The GeneXpert MRSA Assay (FDA approved for NASAL specimens only), is one component of a comprehensive MRSA colonization surveillance program. It is not intended to diagnose MRSA infection nor to guide or monitor treatment for MRSA infections. Test performance is not FDA approved in patients less than 47 years old. Performed at [facility], [address] [place], [place] [address]   Blood Culture (routine x 2)     Status: None (Preliminary result)   Collection Time: 02/17/24 12:31 AM   Specimen: BLOOD LEFT HAND  Result Value Ref Range Status   Specimen Description BLOOD LEFT HAND  Final   Special Requests   Final    BOTTLES DRAWN AEROBIC ONLY Blood Culture adequate volume   Culture   Final    NO GROWTH 2 DAYS Performed at [facility], [address], [place], [place] [address]    Report Status PENDING  Incomplete  Culture, Respiratory w Gram Stain     Status: None (Preliminary result)   Collection Time: 02/18/24  9:37 AM   Specimen: Tracheal Aspirate; Respiratory  Result Value Ref Range Status   Specimen  Description   Final    TRACHEAL ASPIRATE Performed at [facility], [address], [place], [place] [address]    Special Requests   Final    NONE Performed at [facility], [address] [place], [place] [address]    Gram Stain RARE WBC SEEN NO ORGANISMS SEEN   Final   Culture   Final    CULTURE REINCUBATED FOR BETTER GROWTH Performed at [facility], [address], [place], [place] [address]    Report Status PENDING  Incomplete    Lab Basic Metabolic Panel: Recent Labs  Lab 02/16/24 1921 02/16/24 2041 02/17/24 0147 02/17/24 0301 02/18/24 0302  NA 141 144  --  133* 138  K 5.3* 4.3  --  4.7 4.1  CL 102 109  --  101 105  CO2  --  24  --  12* 28  GLUCOSE 152* 88  --  163* 99  BUN 38* 28*  --  21 21  CREATININE 0.90 0.72 0.49* 0.54* 0.63  CALCIUM  --  8.4*  --  8.1* 8.3*  MG  --   --   --  1.6* 2.2  PHOS  --   --   --  2.6 2.4*   Liver Function Tests: Recent Labs  Lab 02/16/24 2041  AST 47*  ALT 41  ALKPHOS 108  BILITOT 0.6  PROT 6.9  ALBUMIN  3.3*   No results for input(s): LIPASE, AMYLASE in the last 168 hours. No results for input(s): AMMONIA in the last 168 hours. CBC: Recent Labs  Lab 02/16/24 1921 02/16/24 1940 02/17/24 0147 02/17/24 0301 02/18/24 0302  WBC  --  6.6 12.5* 14.8* 15.1*  NEUTROABS  --  5.8  --   --  13.4*  HGB 18.7* 16.5 13.4 14.7 11.3*  HCT 55.0* 52.8* 42.6 47.0 33.9*  MCV  --  99.4 102.2* 100.2* 95.2  PLT  --  204 120* 139* 137*   Cardiac Enzymes: No results for input(s): CKTOTAL, CKMB, CKMBINDEX, TROPONINI in the last 168 hours. Sepsis Labs: Recent Labs  Lab 02/16/24 1940 02/17/24 0034 02/17/24 0147 02/17/24 0301 02/17/24 1056 02/17/24 1449 02/18/24 0302 02/18/24 1126 02/18/24 1513  WBC 6.6  --  12.5* 14.8*  --   --  15.1*  --   --   LATICACIDVEN  --    < >  --  8.0* 4.9* 6.7*  --  0.9 1.0   < > = values in this interval not displayed.    Procedures/Operations  Endotracheal Intubation   Dorn KATHEE Chill 02-29-2024, 4:21 PM   "

## 2024-02-22 NOTE — Progress Notes (Signed)
" °   03-04-24 1145  Spiritual Encounters  Type of Visit Initial  Care provided to: Family  Referral source Chaplain assessment;Nurse (RN/NT/LPN)  Reason for visit Patient death  OnCall Visit No   I provided bereavement support to Mr Guy Payne's spouse, Guy Payne, and immediate family. I provided compassionate presence and active listening. Family processed with me Guy Payne's  growing sense of peace as well as his engagement with his faith and meaning. Family likewise shared their gratitude for his release from suffering and their own spiritual process of meaning making.  Lawernce Earll L. Delores HERO.Div "

## 2024-02-22 NOTE — Progress Notes (Signed)
" ° °  NAME:  Guy Payne, MRN:  [id number], DOB:  1950-11-11, LOS: 3 ADMISSION DATE:  02/16/2024, CONSULTATION DATE:  02/16/24 REFERRING MD:  Fairy Gravely, MD, CHIEF COMPLAINT:  Aspiration event   History of Present Illness:  74 y/o male with PMH for ALS x 4.5 years, has NIV but does not use it according to Neurology note, but per wife at beside he has no O2 and no NIV/PAP.  He is PEG tube fed only, no oral feeds/pleasure feeds.  He has multiple aspiration events in the past and he was in the ER in November after an aspiration event/pneumonia.  This time patient BIB wife and was unresponsive with agonal breaths.  Nursing staff pulled patient out of the car and BVM started.  Wife's concern for aspiration. LA 5.2, VBG PH 7.17, cxr unofficially, bilateral infiltrates and lasrge gastric /colonic gas.  Pertinent  Medical History  ALS  Significant Hospital Events: Including procedures, antibiotic start and stop dates in addition to other pertinent events   12/26: admit to ICU 12/27 remained intubated due to gastric distention, OG placed to low intermittent suction 12/28 GOC discussion held with decision made to proceed with transition to comfort care  12/29 Resting comfortably in bed with family at bedside   Interim History / Subjective:  Comfortable on Fent drip   Objective    Blood pressure (!) 103/59, pulse 95, temperature 99.8 F (37.7 C), temperature source Axillary, resp. rate 18, height 5' 7.5 (1.715 m), weight 64.7 kg, SpO2 92%.    Vent Mode: PSV;CPAP FiO2 (%):  [40 %-70 %] 70 % PEEP:  [5 cmH20-6 cmH20] 6 cmH20 Pressure Support:  [10 cmH20] 10 cmH20   Intake/Output Summary (Last 24 hours) at 2024/03/17 9272 Last data filed at 03/17/2024 9340 Gross per 24 hour  Intake 2621.99 ml  Output 1190 ml  Net 1431.99 ml   Filed Weights   02/17/24 0000 02/17/24 0446 02/18/24 0500  Weight: 57.7 kg 57.7 kg 64.7 kg   Examination: General: Acute on chronically ill appearing elderly  male sitting up in bed on RA, in NAD HEENT: [place]/ATT, MM pink/moist, PERRL,  Neuro: Lightly sedated  CV: s1s2 regular rate and rhythm, no murmur, rubs, or gallops,  PULM:  Diminished bilaterally  GI: soft, bowel sounds active in all 4 quadrants, non-tender, non-distended, tolerating oral diet Extremities: warm/dry, no edema  Skin: no rashes or lesions  Resolved problem list  Mild hyponatremia  Assessment and Plan  Acute hypoxic respiratory failure Acute Aspiration Pneumonia Gastric Distention Possible Ileus Peg tube in place Sepsis due to Pneumonia Hypomagnesemia Hypophosphatemia ALS P: Decision made to transition to comfort care 12/28. This am patient remains stable with no signs of distress, spouse at bedside. Continue comfort measures. When stable consider transfer to palliative care floor   Critical care time: NA  Patirica Longshore D. Harris, NP-C [facility] Personal contact information can be found on Amion  If no contact or response made please call [phone] 03/17/24, 7:30 AM        "

## 2024-02-22 NOTE — Progress Notes (Signed)
 [name] Asberry called out via call light to request that patient be made comfort care and to withdraw all other care. She also stated that she needed time alone to say her goodbye now that all family has left. Elink provider notified of request. All meds are being held as of now per wife's request.Charge nurse also updated on requested changes.

## 2024-02-22 DEATH — deceased

## 2024-02-27 ENCOUNTER — Ambulatory Visit: Admitting: Internal Medicine

## 2024-03-04 LAB — GLUCOSE, CAPILLARY: Glucose-Capillary: 97 mg/dL (ref 70–99)

## 2024-03-20 NOTE — Telephone Encounter (Signed)
 error
